# Patient Record
Sex: Female | Born: 1945 | Race: White | Hispanic: No | Marital: Married | Smoking: Never smoker
Health system: Southern US, Community
[De-identification: ages and names within clinical notes are randomized; demographics above are authoritative.]

## PROBLEM LIST (undated history)

## (undated) DIAGNOSIS — D649 Anemia, unspecified: Secondary | ICD-10-CM

## (undated) DIAGNOSIS — C419 Malignant neoplasm of bone and articular cartilage, unspecified: Secondary | ICD-10-CM

## (undated) DIAGNOSIS — IMO0001 Reserved for inherently not codable concepts without codable children: Secondary | ICD-10-CM

## (undated) DIAGNOSIS — C50919 Malignant neoplasm of unspecified site of unspecified female breast: Secondary | ICD-10-CM

## (undated) DIAGNOSIS — C719 Malignant neoplasm of brain, unspecified: Secondary | ICD-10-CM

## (undated) HISTORY — PX: MASTECTOMY: SHX3

## (undated) HISTORY — DX: Reserved for inherently not codable concepts without codable children: IMO0001

## (undated) HISTORY — DX: Malignant neoplasm of unspecified site of unspecified female breast: C50.919

## (undated) HISTORY — DX: Anemia, unspecified: D64.9

---

## 1969-11-26 HISTORY — PX: TUBAL LIGATION: SHX77

## 1999-04-05 ENCOUNTER — Other Ambulatory Visit: Admission: RE | Admit: 1999-04-05 | Discharge: 1999-04-05 | Payer: Self-pay | Admitting: Gynecology

## 2000-04-15 ENCOUNTER — Other Ambulatory Visit: Admission: RE | Admit: 2000-04-15 | Discharge: 2000-04-15 | Payer: Self-pay | Admitting: Gynecology

## 2001-05-06 ENCOUNTER — Other Ambulatory Visit: Admission: RE | Admit: 2001-05-06 | Discharge: 2001-05-06 | Payer: Self-pay | Admitting: Gynecology

## 2002-05-13 ENCOUNTER — Other Ambulatory Visit: Admission: RE | Admit: 2002-05-13 | Discharge: 2002-05-13 | Payer: Self-pay | Admitting: Gynecology

## 2004-05-02 ENCOUNTER — Other Ambulatory Visit: Admission: RE | Admit: 2004-05-02 | Discharge: 2004-05-02 | Payer: Self-pay | Admitting: Gynecology

## 2004-08-08 ENCOUNTER — Ambulatory Visit (HOSPITAL_COMMUNITY): Admission: RE | Admit: 2004-08-08 | Discharge: 2004-08-08 | Payer: Self-pay | Admitting: Gynecology

## 2005-07-26 ENCOUNTER — Other Ambulatory Visit: Admission: RE | Admit: 2005-07-26 | Discharge: 2005-07-26 | Payer: Self-pay | Admitting: Gynecology

## 2005-11-30 ENCOUNTER — Ambulatory Visit (HOSPITAL_COMMUNITY): Admission: RE | Admit: 2005-11-30 | Discharge: 2005-11-30 | Payer: Self-pay | Admitting: Gynecology

## 2006-08-05 ENCOUNTER — Other Ambulatory Visit: Admission: RE | Admit: 2006-08-05 | Discharge: 2006-08-05 | Payer: Self-pay | Admitting: Gynecology

## 2006-08-23 ENCOUNTER — Encounter (INDEPENDENT_AMBULATORY_CARE_PROVIDER_SITE_OTHER): Payer: Self-pay | Admitting: Diagnostic Radiology

## 2006-08-23 ENCOUNTER — Encounter: Admission: RE | Admit: 2006-08-23 | Discharge: 2006-08-23 | Payer: Self-pay | Admitting: General Surgery

## 2006-08-23 ENCOUNTER — Encounter (INDEPENDENT_AMBULATORY_CARE_PROVIDER_SITE_OTHER): Payer: Self-pay | Admitting: Specialist

## 2006-08-28 ENCOUNTER — Ambulatory Visit: Payer: Self-pay | Admitting: Oncology

## 2006-08-30 ENCOUNTER — Encounter: Admission: RE | Admit: 2006-08-30 | Discharge: 2006-08-30 | Payer: Self-pay | Admitting: Oncology

## 2006-09-09 LAB — CBC WITH DIFFERENTIAL/PLATELET
BASO%: 0.7 % (ref 0.0–2.0)
Basophils Absolute: 0.1 10*3/uL (ref 0.0–0.1)
EOS%: 1.4 % (ref 0.0–7.0)
HCT: 41.2 % (ref 34.8–46.6)
MCH: 30.7 pg (ref 26.0–34.0)
MCHC: 34.2 g/dL (ref 32.0–36.0)
MCV: 89.8 fL (ref 81.0–101.0)
MONO%: 10.2 % (ref 0.0–13.0)
NEUT%: 52.5 % (ref 39.6–76.8)
RDW: 13.1 % (ref 11.3–14.5)
lymph#: 2.8 10*3/uL (ref 0.9–3.3)

## 2006-09-09 LAB — COMPREHENSIVE METABOLIC PANEL
AST: 25 U/L (ref 0–37)
Albumin: 4.7 g/dL (ref 3.5–5.2)
Alkaline Phosphatase: 92 U/L (ref 39–117)
BUN: 13 mg/dL (ref 6–23)
Glucose, Bld: 87 mg/dL (ref 70–99)
Potassium: 4.2 mEq/L (ref 3.5–5.3)
Total Bilirubin: 0.4 mg/dL (ref 0.3–1.2)

## 2006-09-13 ENCOUNTER — Ambulatory Visit (HOSPITAL_BASED_OUTPATIENT_CLINIC_OR_DEPARTMENT_OTHER): Admission: RE | Admit: 2006-09-13 | Discharge: 2006-09-13 | Payer: Self-pay | Admitting: General Surgery

## 2006-09-18 ENCOUNTER — Ambulatory Visit: Payer: Self-pay

## 2006-09-18 ENCOUNTER — Encounter: Payer: Self-pay | Admitting: Cardiology

## 2006-09-19 ENCOUNTER — Ambulatory Visit (HOSPITAL_COMMUNITY): Admission: RE | Admit: 2006-09-19 | Discharge: 2006-09-19 | Payer: Self-pay | Admitting: Oncology

## 2006-09-23 ENCOUNTER — Ambulatory Visit (HOSPITAL_COMMUNITY): Admission: RE | Admit: 2006-09-23 | Discharge: 2006-09-23 | Payer: Self-pay | Admitting: Oncology

## 2006-09-24 ENCOUNTER — Ambulatory Visit (HOSPITAL_COMMUNITY): Admission: RE | Admit: 2006-09-24 | Discharge: 2006-09-24 | Payer: Self-pay | Admitting: Oncology

## 2006-09-27 LAB — CBC WITH DIFFERENTIAL/PLATELET
Basophils Absolute: 0 10*3/uL (ref 0.0–0.1)
Eosinophils Absolute: 0.1 10*3/uL (ref 0.0–0.5)
HGB: 12.6 g/dL (ref 11.6–15.9)
LYMPH%: 78 % — ABNORMAL HIGH (ref 14.0–48.0)
MCV: 90 fL (ref 81.0–101.0)
MONO%: 5.7 % (ref 0.0–13.0)
NEUT#: 0.1 10*3/uL — CL (ref 1.5–6.5)
Platelets: 134 10*3/uL — ABNORMAL LOW (ref 145–400)
RDW: 12.6 % (ref 11.3–14.5)

## 2006-10-04 LAB — COMPREHENSIVE METABOLIC PANEL
Alkaline Phosphatase: 112 U/L (ref 39–117)
BUN: 13 mg/dL (ref 6–23)
Creatinine, Ser: 0.84 mg/dL (ref 0.40–1.20)
Glucose, Bld: 104 mg/dL — ABNORMAL HIGH (ref 70–99)
Sodium: 142 mEq/L (ref 135–145)
Total Bilirubin: 0.3 mg/dL (ref 0.3–1.2)
Total Protein: 7.1 g/dL (ref 6.0–8.3)

## 2006-10-04 LAB — CBC WITH DIFFERENTIAL/PLATELET
Eosinophils Absolute: 0 10*3/uL (ref 0.0–0.5)
HCT: 35.8 % (ref 34.8–46.6)
LYMPH%: 9.7 % — ABNORMAL LOW (ref 14.0–48.0)
MCV: 87.9 fL (ref 81.0–101.0)
MONO%: 6.1 % (ref 0.0–13.0)
NEUT#: 13.5 10*3/uL — ABNORMAL HIGH (ref 1.5–6.5)
NEUT%: 82.9 % — ABNORMAL HIGH (ref 39.6–76.8)
Platelets: 219 10*3/uL (ref 145–400)
RBC: 4.07 10*6/uL (ref 3.70–5.32)

## 2006-10-11 LAB — CBC WITH DIFFERENTIAL/PLATELET
Eosinophils Absolute: 0 10*3/uL (ref 0.0–0.5)
LYMPH%: 29.3 % (ref 14.0–48.0)
MONO#: 0.1 10*3/uL (ref 0.1–0.9)
NEUT#: 1.3 10*3/uL — ABNORMAL LOW (ref 1.5–6.5)
Platelets: 131 10*3/uL — ABNORMAL LOW (ref 145–400)
RBC: 3.5 10*6/uL — ABNORMAL LOW (ref 3.70–5.32)
WBC: 2.1 10*3/uL — ABNORMAL LOW (ref 3.9–10.0)
lymph#: 0.6 10*3/uL — ABNORMAL LOW (ref 0.9–3.3)

## 2006-10-15 ENCOUNTER — Ambulatory Visit: Payer: Self-pay | Admitting: Oncology

## 2006-10-18 LAB — CBC WITH DIFFERENTIAL/PLATELET
Basophils Absolute: 0.3 10*3/uL — ABNORMAL HIGH (ref 0.0–0.1)
Eosinophils Absolute: 0 10*3/uL (ref 0.0–0.5)
HCT: 35.2 % (ref 34.8–46.6)
HGB: 12.1 g/dL (ref 11.6–15.9)
LYMPH%: 6.4 % — ABNORMAL LOW (ref 14.0–48.0)
MCHC: 34.2 g/dL (ref 32.0–36.0)
MONO#: 1.6 10*3/uL — ABNORMAL HIGH (ref 0.1–0.9)
NEUT#: 16.1 10*3/uL — ABNORMAL HIGH (ref 1.5–6.5)
NEUT%: 83.8 % — ABNORMAL HIGH (ref 39.6–76.8)
Platelets: 210 10*3/uL (ref 145–400)
WBC: 19.2 10*3/uL — ABNORMAL HIGH (ref 3.9–10.0)

## 2006-10-25 LAB — CBC WITH DIFFERENTIAL/PLATELET
BASO%: 2.5 % — ABNORMAL HIGH (ref 0.0–2.0)
Basophils Absolute: 0 10*3/uL (ref 0.0–0.1)
EOS%: 0.1 % (ref 0.0–7.0)
HCT: 28.4 % — ABNORMAL LOW (ref 34.8–46.6)
HGB: 9.9 g/dL — ABNORMAL LOW (ref 11.6–15.9)
LYMPH%: 40.3 % (ref 14.0–48.0)
MCH: 31.3 pg (ref 26.0–34.0)
MCHC: 35 g/dL (ref 32.0–36.0)
NEUT%: 49.6 % (ref 39.6–76.8)
Platelets: 119 10*3/uL — ABNORMAL LOW (ref 145–400)

## 2006-10-31 LAB — CBC WITH DIFFERENTIAL/PLATELET
BASO%: 0.3 % (ref 0.0–2.0)
Basophils Absolute: 0 10*3/uL (ref 0.0–0.1)
Eosinophils Absolute: 0 10*3/uL (ref 0.0–0.5)
HCT: 30.3 % — ABNORMAL LOW (ref 34.8–46.6)
HGB: 10.2 g/dL — ABNORMAL LOW (ref 11.6–15.9)
MONO#: 1.3 10*3/uL — ABNORMAL HIGH (ref 0.1–0.9)
NEUT#: 9.1 10*3/uL — ABNORMAL HIGH (ref 1.5–6.5)
NEUT%: 81 % — ABNORMAL HIGH (ref 39.6–76.8)
WBC: 11.3 10*3/uL — ABNORMAL HIGH (ref 3.9–10.0)
lymph#: 0.8 10*3/uL — ABNORMAL LOW (ref 0.9–3.3)

## 2006-11-06 ENCOUNTER — Encounter: Admission: RE | Admit: 2006-11-06 | Discharge: 2006-11-06 | Payer: Self-pay | Admitting: Oncology

## 2006-11-07 LAB — CBC WITH DIFFERENTIAL/PLATELET
Basophils Absolute: 0 10*3/uL (ref 0.0–0.1)
EOS%: 0.6 % (ref 0.0–7.0)
HCT: 29.9 % — ABNORMAL LOW (ref 34.8–46.6)
HGB: 10.2 g/dL — ABNORMAL LOW (ref 11.6–15.9)
MCH: 30.6 pg (ref 26.0–34.0)
MCV: 89.9 fL (ref 81.0–101.0)
NEUT%: 74 % (ref 39.6–76.8)
Platelets: 210 10*3/uL (ref 145–400)
lymph#: 0.2 10*3/uL — ABNORMAL LOW (ref 0.9–3.3)

## 2006-11-14 LAB — CBC WITH DIFFERENTIAL/PLATELET
BASO%: 0.6 % (ref 0.0–2.0)
Basophils Absolute: 0.2 10*3/uL — ABNORMAL HIGH (ref 0.0–0.1)
EOS%: 0 % (ref 0.0–7.0)
HGB: 10.1 g/dL — ABNORMAL LOW (ref 11.6–15.9)
MCH: 30.6 pg (ref 26.0–34.0)
MCHC: 33.9 g/dL (ref 32.0–36.0)
RDW: 16.5 % — ABNORMAL HIGH (ref 11.3–14.5)
lymph#: 0.6 10*3/uL — ABNORMAL LOW (ref 0.9–3.3)

## 2006-11-21 LAB — CBC WITH DIFFERENTIAL/PLATELET
Basophils Absolute: 0.5 10*3/uL — ABNORMAL HIGH (ref 0.0–0.1)
Eosinophils Absolute: 0 10*3/uL (ref 0.0–0.5)
HGB: 11.7 g/dL (ref 11.6–15.9)
MONO#: 3.2 10*3/uL — ABNORMAL HIGH (ref 0.1–0.9)
NEUT#: 24.5 10*3/uL — ABNORMAL HIGH (ref 1.5–6.5)
RDW: 15.8 % — ABNORMAL HIGH (ref 11.3–14.5)
lymph#: 1.1 10*3/uL (ref 0.9–3.3)

## 2006-11-28 LAB — CBC WITH DIFFERENTIAL/PLATELET
BASO%: 1.3 % (ref 0.0–2.0)
LYMPH%: 4.6 % — ABNORMAL LOW (ref 14.0–48.0)
MCHC: 35.5 g/dL (ref 32.0–36.0)
MCV: 88.8 fL (ref 81.0–101.0)
MONO%: 7.3 % (ref 0.0–13.0)
Platelets: 208 10*3/uL (ref 145–400)
RBC: 3.38 10*6/uL — ABNORMAL LOW (ref 3.70–5.32)

## 2006-11-28 LAB — COMPREHENSIVE METABOLIC PANEL
Alkaline Phosphatase: 103 U/L (ref 39–117)
Glucose, Bld: 104 mg/dL — ABNORMAL HIGH (ref 70–99)
Sodium: 139 mEq/L (ref 135–145)
Total Bilirubin: 0.9 mg/dL (ref 0.3–1.2)
Total Protein: 6.6 g/dL (ref 6.0–8.3)

## 2006-12-02 ENCOUNTER — Ambulatory Visit: Payer: Self-pay | Admitting: Oncology

## 2006-12-05 LAB — CBC WITH DIFFERENTIAL/PLATELET
Basophils Absolute: 0.1 10*3/uL (ref 0.0–0.1)
Eosinophils Absolute: 0 10*3/uL (ref 0.0–0.5)
HCT: 25.4 % — ABNORMAL LOW (ref 34.8–46.6)
LYMPH%: 6.3 % — ABNORMAL LOW (ref 14.0–48.0)
MCV: 89.6 fL (ref 81.0–101.0)
MONO%: 10.4 % (ref 0.0–13.0)
NEUT#: 5.6 10*3/uL (ref 1.5–6.5)
NEUT%: 82.1 % — ABNORMAL HIGH (ref 39.6–76.8)
Platelets: 258 10*3/uL (ref 145–400)
RBC: 2.83 10*6/uL — ABNORMAL LOW (ref 3.70–5.32)

## 2006-12-12 LAB — CBC WITH DIFFERENTIAL/PLATELET
BASO%: 1.8 % (ref 0.0–2.0)
Basophils Absolute: 0.1 10*3/uL (ref 0.0–0.1)
EOS%: 0.7 % (ref 0.0–7.0)
Eosinophils Absolute: 0 10*3/uL (ref 0.0–0.5)
HCT: 27.9 % — ABNORMAL LOW (ref 34.8–46.6)
HGB: 9.4 g/dL — ABNORMAL LOW (ref 11.6–15.9)
LYMPH%: 7.8 % — ABNORMAL LOW (ref 14.0–48.0)
MCH: 31.4 pg (ref 26.0–34.0)
MCHC: 33.8 g/dL (ref 32.0–36.0)
MCV: 93 fL (ref 81.0–101.0)
MONO#: 0.8 10*3/uL (ref 0.1–0.9)
MONO%: 13.5 % — ABNORMAL HIGH (ref 0.0–13.0)
NEUT#: 4.5 10*3/uL (ref 1.5–6.5)
NEUT%: 76.1 % (ref 39.6–76.8)
Platelets: 356 10*3/uL (ref 145–400)
RBC: 3 10*6/uL — ABNORMAL LOW (ref 3.70–5.32)
RDW: 16.4 % — ABNORMAL HIGH (ref 11.3–14.5)
WBC: 5.9 10*3/uL (ref 3.9–10.0)
lymph#: 0.5 10*3/uL — ABNORMAL LOW (ref 0.9–3.3)

## 2006-12-19 LAB — CBC WITH DIFFERENTIAL/PLATELET
BASO%: 0.7 % (ref 0.0–2.0)
EOS%: 1 % (ref 0.0–7.0)
HCT: 30.9 % — ABNORMAL LOW (ref 34.8–46.6)
LYMPH%: 8.3 % — ABNORMAL LOW (ref 14.0–48.0)
MCH: 29.1 pg (ref 26.0–34.0)
MCHC: 31.7 g/dL — ABNORMAL LOW (ref 32.0–36.0)
MCV: 91.8 fL (ref 81.0–101.0)
MONO%: 11.8 % (ref 0.0–13.0)
NEUT%: 78.2 % — ABNORMAL HIGH (ref 39.6–76.8)
lymph#: 0.6 10*3/uL — ABNORMAL LOW (ref 0.9–3.3)

## 2006-12-25 LAB — CBC WITH DIFFERENTIAL/PLATELET
BASO%: 1.8 % (ref 0.0–2.0)
EOS%: 1.8 % (ref 0.0–7.0)
HGB: 10 g/dL — ABNORMAL LOW (ref 11.6–15.9)
MCH: 29.5 pg (ref 26.0–34.0)
MCHC: 31.8 g/dL — ABNORMAL LOW (ref 32.0–36.0)
MCV: 92.8 fL (ref 81.0–101.0)
MONO%: 5.7 % (ref 0.0–13.0)
RBC: 3.38 10*6/uL — ABNORMAL LOW (ref 3.70–5.32)
RDW: 16.8 % — ABNORMAL HIGH (ref 11.3–14.5)
lymph#: 0.8 10*3/uL — ABNORMAL LOW (ref 0.9–3.3)

## 2007-01-01 LAB — CBC WITH DIFFERENTIAL/PLATELET
Basophils Absolute: 0.1 10*3/uL (ref 0.0–0.1)
Eosinophils Absolute: 0.1 10*3/uL (ref 0.0–0.5)
HGB: 10.3 g/dL — ABNORMAL LOW (ref 11.6–15.9)
MONO%: 9 % (ref 0.0–13.0)
NEUT#: 1.4 10*3/uL — ABNORMAL LOW (ref 1.5–6.5)
RBC: 3.46 10*6/uL — ABNORMAL LOW (ref 3.70–5.32)
RDW: 17.9 % — ABNORMAL HIGH (ref 11.3–14.5)
WBC: 2.6 10*3/uL — ABNORMAL LOW (ref 3.9–10.0)
lymph#: 0.8 10*3/uL — ABNORMAL LOW (ref 0.9–3.3)

## 2007-01-09 LAB — CBC WITH DIFFERENTIAL/PLATELET
Basophils Absolute: 0.1 10*3/uL (ref 0.0–0.1)
Eosinophils Absolute: 0 10*3/uL (ref 0.0–0.5)
HGB: 11.5 g/dL — ABNORMAL LOW (ref 11.6–15.9)
MCV: 94.9 fL (ref 81.0–101.0)
MONO#: 0.2 10*3/uL (ref 0.1–0.9)
MONO%: 8.1 % (ref 0.0–13.0)
NEUT#: 1.1 10*3/uL — ABNORMAL LOW (ref 1.5–6.5)
Platelets: 294 10*3/uL (ref 145–400)
RBC: 3.76 10*6/uL (ref 3.70–5.32)
RDW: 17.4 % — ABNORMAL HIGH (ref 11.3–14.5)
WBC: 2.2 10*3/uL — ABNORMAL LOW (ref 3.9–10.0)

## 2007-01-13 ENCOUNTER — Ambulatory Visit: Payer: Self-pay | Admitting: Oncology

## 2007-01-15 LAB — CBC WITH DIFFERENTIAL/PLATELET
Basophils Absolute: 0 10*3/uL (ref 0.0–0.1)
Eosinophils Absolute: 0 10*3/uL (ref 0.0–0.5)
HCT: 35.5 % (ref 34.8–46.6)
LYMPH%: 25.5 % (ref 14.0–48.0)
MCV: 91.6 fL (ref 81.0–101.0)
MONO#: 0.8 10*3/uL (ref 0.1–0.9)
MONO%: 23.6 % — ABNORMAL HIGH (ref 0.0–13.0)
NEUT#: 1.7 10*3/uL (ref 1.5–6.5)
NEUT%: 49.3 % (ref 39.6–76.8)
Platelets: 262 10*3/uL (ref 145–400)
WBC: 3.5 10*3/uL — ABNORMAL LOW (ref 3.9–10.0)

## 2007-01-22 LAB — CBC WITH DIFFERENTIAL/PLATELET
Basophils Absolute: 0.1 10*3/uL (ref 0.0–0.1)
HCT: 39.8 % (ref 34.8–46.6)
HGB: 12.8 g/dL (ref 11.6–15.9)
MCH: 29.7 pg (ref 26.0–34.0)
MONO#: 0.3 10*3/uL (ref 0.1–0.9)
NEUT%: 65.2 % (ref 39.6–76.8)
WBC: 4.8 10*3/uL (ref 3.9–10.0)
lymph#: 1.3 10*3/uL (ref 0.9–3.3)

## 2007-01-29 LAB — CBC WITH DIFFERENTIAL/PLATELET
Basophils Absolute: 0.1 10*3/uL (ref 0.0–0.1)
EOS%: 1.9 % (ref 0.0–7.0)
HCT: 37.2 % (ref 34.8–46.6)
HGB: 11.9 g/dL (ref 11.6–15.9)
MCH: 29.3 pg (ref 26.0–34.0)
MCV: 91.9 fL (ref 81.0–101.0)
MONO%: 6.2 % (ref 0.0–13.0)
NEUT%: 46.8 % (ref 39.6–76.8)

## 2007-02-05 ENCOUNTER — Encounter: Admission: RE | Admit: 2007-02-05 | Discharge: 2007-02-05 | Payer: Self-pay | Admitting: Oncology

## 2007-02-06 LAB — COMPREHENSIVE METABOLIC PANEL
AST: 17 U/L (ref 0–37)
Alkaline Phosphatase: 61 U/L (ref 39–117)
BUN: 7 mg/dL (ref 6–23)
Calcium: 9 mg/dL (ref 8.4–10.5)
Chloride: 105 mEq/L (ref 96–112)
Creatinine, Ser: 0.67 mg/dL (ref 0.40–1.20)

## 2007-02-06 LAB — CBC WITH DIFFERENTIAL/PLATELET
Basophils Absolute: 0.1 10*3/uL (ref 0.0–0.1)
EOS%: 1.2 % (ref 0.0–7.0)
Eosinophils Absolute: 0 10*3/uL (ref 0.0–0.5)
HCT: 33.6 % — ABNORMAL LOW (ref 34.8–46.6)
HGB: 11.2 g/dL — ABNORMAL LOW (ref 11.6–15.9)
MCH: 29.5 pg (ref 26.0–34.0)
NEUT%: 46.9 % (ref 39.6–76.8)
lymph#: 0.9 10*3/uL (ref 0.9–3.3)

## 2007-02-07 ENCOUNTER — Encounter (INDEPENDENT_AMBULATORY_CARE_PROVIDER_SITE_OTHER): Payer: Self-pay | Admitting: Specialist

## 2007-02-07 ENCOUNTER — Encounter: Admission: RE | Admit: 2007-02-07 | Discharge: 2007-02-07 | Payer: Self-pay | Admitting: General Surgery

## 2007-02-07 ENCOUNTER — Ambulatory Visit (HOSPITAL_COMMUNITY): Admission: RE | Admit: 2007-02-07 | Discharge: 2007-02-07 | Payer: Self-pay | Admitting: General Surgery

## 2007-03-05 ENCOUNTER — Ambulatory Visit: Payer: Self-pay | Admitting: Oncology

## 2007-03-10 ENCOUNTER — Ambulatory Visit: Admission: RE | Admit: 2007-03-10 | Discharge: 2007-06-08 | Payer: Self-pay | Admitting: Radiation Oncology

## 2007-03-10 LAB — COMPREHENSIVE METABOLIC PANEL
ALT: 14 U/L (ref 0–35)
Albumin: 4.3 g/dL (ref 3.5–5.2)
CO2: 28 mEq/L (ref 19–32)
Calcium: 9.6 mg/dL (ref 8.4–10.5)
Chloride: 107 mEq/L (ref 96–112)
Sodium: 143 mEq/L (ref 135–145)
Total Protein: 6.4 g/dL (ref 6.0–8.3)

## 2007-03-10 LAB — CBC WITH DIFFERENTIAL/PLATELET
Basophils Absolute: 0 10*3/uL (ref 0.0–0.1)
EOS%: 1.5 % (ref 0.0–7.0)
HCT: 35.1 % (ref 34.8–46.6)
HGB: 11.8 g/dL (ref 11.6–15.9)
LYMPH%: 18.2 % (ref 14.0–48.0)
MCH: 29.7 pg (ref 26.0–34.0)
MCV: 88.5 fL (ref 81.0–101.0)
MONO%: 8.5 % (ref 0.0–13.0)
NEUT%: 71.3 % (ref 39.6–76.8)
Platelets: 226 10*3/uL (ref 145–400)
RDW: 19.6 % — ABNORMAL HIGH (ref 11.3–14.5)

## 2007-03-10 LAB — LACTATE DEHYDROGENASE: LDH: 174 U/L (ref 94–250)

## 2007-03-19 ENCOUNTER — Encounter (INDEPENDENT_AMBULATORY_CARE_PROVIDER_SITE_OTHER): Payer: Self-pay | Admitting: Specialist

## 2007-03-19 ENCOUNTER — Ambulatory Visit (HOSPITAL_COMMUNITY): Admission: RE | Admit: 2007-03-19 | Discharge: 2007-03-20 | Payer: Self-pay | Admitting: General Surgery

## 2007-04-01 LAB — CBC WITH DIFFERENTIAL/PLATELET
BASO%: 0.6 % (ref 0.0–2.0)
Basophils Absolute: 0 10*3/uL (ref 0.0–0.1)
EOS%: 1.7 % (ref 0.0–7.0)
MCH: 31 pg (ref 26.0–34.0)
MCHC: 34.7 g/dL (ref 32.0–36.0)
MCV: 89.4 fL (ref 81.0–101.0)
MONO%: 6.9 % (ref 0.0–13.0)
RBC: 3.95 10*6/uL (ref 3.70–5.32)
RDW: 17.8 % — ABNORMAL HIGH (ref 11.3–14.5)

## 2007-04-01 LAB — BASIC METABOLIC PANEL
BUN: 10 mg/dL (ref 6–23)
Potassium: 4 mEq/L (ref 3.5–5.3)
Sodium: 145 mEq/L (ref 135–145)

## 2007-04-28 ENCOUNTER — Ambulatory Visit (HOSPITAL_BASED_OUTPATIENT_CLINIC_OR_DEPARTMENT_OTHER): Admission: RE | Admit: 2007-04-28 | Discharge: 2007-04-28 | Payer: Self-pay | Admitting: General Surgery

## 2007-05-22 ENCOUNTER — Ambulatory Visit: Payer: Self-pay | Admitting: Oncology

## 2007-05-28 LAB — CBC WITH DIFFERENTIAL/PLATELET
Eosinophils Absolute: 0.1 10*3/uL (ref 0.0–0.5)
LYMPH%: 25.9 % (ref 14.0–48.0)
MONO#: 0.4 10*3/uL (ref 0.1–0.9)
NEUT#: 3 10*3/uL (ref 1.5–6.5)
Platelets: 184 10*3/uL (ref 145–400)
RBC: 3.84 10*6/uL (ref 3.70–5.32)
WBC: 4.9 10*3/uL (ref 3.9–10.0)

## 2007-06-02 LAB — COMPREHENSIVE METABOLIC PANEL
AST: 19 U/L (ref 0–37)
Alkaline Phosphatase: 77 U/L (ref 39–117)
BUN: 13 mg/dL (ref 6–23)
Creatinine, Ser: 0.76 mg/dL (ref 0.40–1.20)
Potassium: 3.6 mEq/L (ref 3.5–5.3)

## 2007-06-02 LAB — VITAMIN D PNL(25-HYDRXY+1,25-DIHY)-BLD
Vit D, 1,25-Dihydroxy: 23 pg/mL (ref 6–62)
Vit D, 25-Hydroxy: 27 ng/mL (ref 20–57)

## 2007-09-24 ENCOUNTER — Ambulatory Visit (HOSPITAL_COMMUNITY): Admission: RE | Admit: 2007-09-24 | Discharge: 2007-09-24 | Payer: Self-pay | Admitting: Gynecology

## 2007-09-29 ENCOUNTER — Other Ambulatory Visit: Admission: RE | Admit: 2007-09-29 | Discharge: 2007-09-29 | Payer: Self-pay | Admitting: Gynecology

## 2007-10-15 ENCOUNTER — Ambulatory Visit: Payer: Self-pay | Admitting: Oncology

## 2007-10-17 LAB — COMPREHENSIVE METABOLIC PANEL
Albumin: 4.8 g/dL (ref 3.5–5.2)
BUN: 14 mg/dL (ref 6–23)
CO2: 26 mEq/L (ref 19–32)
Calcium: 9.7 mg/dL (ref 8.4–10.5)
Chloride: 103 mEq/L (ref 96–112)
Glucose, Bld: 88 mg/dL (ref 70–99)
Potassium: 3.4 mEq/L — ABNORMAL LOW (ref 3.5–5.3)
Sodium: 142 mEq/L (ref 135–145)
Total Protein: 7.1 g/dL (ref 6.0–8.3)

## 2007-10-17 LAB — CBC WITH DIFFERENTIAL/PLATELET
Basophils Absolute: 0.1 10*3/uL (ref 0.0–0.1)
Eosinophils Absolute: 0.1 10*3/uL (ref 0.0–0.5)
HGB: 13.3 g/dL (ref 11.6–15.9)
NEUT#: 3.9 10*3/uL (ref 1.5–6.5)
RDW: 12.9 % (ref 11.3–14.5)
WBC: 6.1 10*3/uL (ref 3.9–10.0)
lymph#: 1.5 10*3/uL (ref 0.9–3.3)

## 2007-10-17 LAB — CANCER ANTIGEN 27.29: CA 27.29: 19 U/mL (ref 0–39)

## 2008-02-09 ENCOUNTER — Ambulatory Visit: Payer: Self-pay | Admitting: Oncology

## 2008-02-11 LAB — CBC WITH DIFFERENTIAL/PLATELET
Basophils Absolute: 0 10*3/uL (ref 0.0–0.1)
EOS%: 2.5 % (ref 0.0–7.0)
Eosinophils Absolute: 0.1 10*3/uL (ref 0.0–0.5)
LYMPH%: 27.3 % (ref 14.0–48.0)
MCH: 32.8 pg (ref 26.0–34.0)
MCV: 93.7 fL (ref 81.0–101.0)
MONO%: 9.5 % (ref 0.0–13.0)
NEUT#: 3.3 10*3/uL (ref 1.5–6.5)
Platelets: 190 10*3/uL (ref 145–400)
RBC: 3.64 10*6/uL — ABNORMAL LOW (ref 3.70–5.32)

## 2008-02-11 LAB — COMPREHENSIVE METABOLIC PANEL
AST: 20 U/L (ref 0–37)
Alkaline Phosphatase: 68 U/L (ref 39–117)
BUN: 15 mg/dL (ref 6–23)
Glucose, Bld: 105 mg/dL — ABNORMAL HIGH (ref 70–99)
Total Bilirubin: 0.4 mg/dL (ref 0.3–1.2)

## 2008-02-11 LAB — CANCER ANTIGEN 27.29: CA 27.29: 18 U/mL (ref 0–39)

## 2008-08-03 ENCOUNTER — Ambulatory Visit: Payer: Self-pay | Admitting: Oncology

## 2008-08-06 LAB — CBC WITH DIFFERENTIAL/PLATELET
Basophils Absolute: 0 10*3/uL (ref 0.0–0.1)
Eosinophils Absolute: 0.1 10*3/uL (ref 0.0–0.5)
HCT: 37.5 % (ref 34.8–46.6)
HGB: 12.9 g/dL (ref 11.6–15.9)
LYMPH%: 24.2 % (ref 14.0–48.0)
MCV: 95.7 fL (ref 81.0–101.0)
MONO#: 0.4 10*3/uL (ref 0.1–0.9)
NEUT#: 3.8 10*3/uL (ref 1.5–6.5)
NEUT%: 66 % (ref 39.6–76.8)
Platelets: 181 10*3/uL (ref 145–400)
WBC: 5.8 10*3/uL (ref 3.9–10.0)

## 2008-08-09 LAB — LACTATE DEHYDROGENASE: LDH: 170 U/L (ref 94–250)

## 2008-08-09 LAB — COMPREHENSIVE METABOLIC PANEL
BUN: 15 mg/dL (ref 6–23)
CO2: 27 mEq/L (ref 19–32)
Creatinine, Ser: 0.82 mg/dL (ref 0.40–1.20)
Glucose, Bld: 93 mg/dL (ref 70–99)
Sodium: 141 mEq/L (ref 135–145)
Total Bilirubin: 0.5 mg/dL (ref 0.3–1.2)
Total Protein: 6.9 g/dL (ref 6.0–8.3)

## 2008-08-09 LAB — CANCER ANTIGEN 27.29: CA 27.29: 18 U/mL (ref 0–39)

## 2008-08-09 LAB — VITAMIN D 25 HYDROXY (VIT D DEFICIENCY, FRACTURES): Vit D, 25-Hydroxy: 55 ng/mL (ref 30–89)

## 2008-09-30 ENCOUNTER — Other Ambulatory Visit: Admission: RE | Admit: 2008-09-30 | Discharge: 2008-09-30 | Payer: Self-pay | Admitting: Gynecology

## 2008-10-04 ENCOUNTER — Ambulatory Visit (HOSPITAL_COMMUNITY): Admission: RE | Admit: 2008-10-04 | Discharge: 2008-10-04 | Payer: Self-pay | Admitting: Gynecology

## 2009-02-21 ENCOUNTER — Ambulatory Visit: Payer: Self-pay | Admitting: Oncology

## 2009-02-24 LAB — CBC WITH DIFFERENTIAL/PLATELET
Basophils Absolute: 0 10*3/uL (ref 0.0–0.1)
EOS%: 1.5 % (ref 0.0–7.0)
Eosinophils Absolute: 0.1 10*3/uL (ref 0.0–0.5)
HCT: 37.6 % (ref 34.8–46.6)
HGB: 13 g/dL (ref 11.6–15.9)
LYMPH%: 30.5 % (ref 14.0–49.7)
MCH: 33.1 pg (ref 25.1–34.0)
MCV: 95.7 fL (ref 79.5–101.0)
MONO%: 8.4 % (ref 0.0–14.0)
NEUT#: 3 10*3/uL (ref 1.5–6.5)
NEUT%: 59.1 % (ref 38.4–76.8)
Platelets: 194 10*3/uL (ref 145–400)
RDW: 12.7 % (ref 11.2–14.5)

## 2009-02-25 LAB — LACTATE DEHYDROGENASE: LDH: 175 U/L (ref 94–250)

## 2009-09-28 ENCOUNTER — Ambulatory Visit: Payer: Self-pay | Admitting: Oncology

## 2009-09-30 LAB — COMPREHENSIVE METABOLIC PANEL
ALT: 18 U/L (ref 0–35)
AST: 22 U/L (ref 0–37)
Albumin: 4.3 g/dL (ref 3.5–5.2)
Alkaline Phosphatase: 60 U/L (ref 39–117)
Glucose, Bld: 99 mg/dL (ref 70–99)
Potassium: 4 mEq/L (ref 3.5–5.3)
Sodium: 144 mEq/L (ref 135–145)
Total Bilirubin: 0.5 mg/dL (ref 0.3–1.2)
Total Protein: 6.1 g/dL (ref 6.0–8.3)

## 2009-09-30 LAB — CBC WITH DIFFERENTIAL/PLATELET
BASO%: 0.7 % (ref 0.0–2.0)
Eosinophils Absolute: 0.1 10*3/uL (ref 0.0–0.5)
LYMPH%: 27.6 % (ref 14.0–49.7)
MCHC: 33.7 g/dL (ref 31.5–36.0)
MCV: 96.9 fL (ref 79.5–101.0)
MONO%: 9.2 % (ref 0.0–14.0)
NEUT#: 3.2 10*3/uL (ref 1.5–6.5)
RBC: 3.72 10*6/uL (ref 3.70–5.45)
RDW: 13.2 % (ref 11.2–14.5)
WBC: 5.3 10*3/uL (ref 3.9–10.3)

## 2009-09-30 LAB — CANCER ANTIGEN 27.29: CA 27.29: 20 U/mL (ref 0–39)

## 2010-03-30 ENCOUNTER — Ambulatory Visit: Payer: Self-pay | Admitting: Oncology

## 2010-03-31 LAB — LACTATE DEHYDROGENASE: LDH: 175 U/L (ref 94–250)

## 2010-03-31 LAB — CBC WITH DIFFERENTIAL/PLATELET
BASO%: 0.7 % (ref 0.0–2.0)
EOS%: 1.4 % (ref 0.0–7.0)
MCH: 32.4 pg (ref 25.1–34.0)
MCHC: 33.7 g/dL (ref 31.5–36.0)
RBC: 4.01 10*6/uL (ref 3.70–5.45)
RDW: 13.6 % (ref 11.2–14.5)
lymph#: 1.8 10*3/uL (ref 0.9–3.3)

## 2010-03-31 LAB — COMPREHENSIVE METABOLIC PANEL
ALT: 17 U/L (ref 0–35)
Alkaline Phosphatase: 63 U/L (ref 39–117)
Glucose, Bld: 82 mg/dL (ref 70–99)
Sodium: 141 mEq/L (ref 135–145)
Total Bilirubin: 0.5 mg/dL (ref 0.3–1.2)
Total Protein: 6.9 g/dL (ref 6.0–8.3)

## 2010-10-04 ENCOUNTER — Ambulatory Visit: Payer: Self-pay | Admitting: Oncology

## 2010-10-06 LAB — CBC WITH DIFFERENTIAL/PLATELET
BASO%: 0.3 % (ref 0.0–2.0)
Basophils Absolute: 0 10*3/uL (ref 0.0–0.1)
HCT: 38.3 % (ref 34.8–46.6)
LYMPH%: 22.9 % (ref 14.0–49.7)
MCHC: 34 g/dL (ref 31.5–36.0)
MONO#: 0.6 10*3/uL (ref 0.1–0.9)
NEUT%: 68.1 % (ref 38.4–76.8)
Platelets: 219 10*3/uL (ref 145–400)
WBC: 8.1 10*3/uL (ref 3.9–10.3)

## 2010-10-07 LAB — COMPREHENSIVE METABOLIC PANEL
AST: 18 U/L (ref 0–37)
BUN: 13 mg/dL (ref 6–23)
CO2: 34 mEq/L — ABNORMAL HIGH (ref 19–32)
Calcium: 9.8 mg/dL (ref 8.4–10.5)
Chloride: 101 mEq/L (ref 96–112)
Creatinine, Ser: 0.88 mg/dL (ref 0.40–1.20)
Glucose, Bld: 136 mg/dL — ABNORMAL HIGH (ref 70–99)

## 2011-04-10 ENCOUNTER — Other Ambulatory Visit: Payer: Self-pay | Admitting: Oncology

## 2011-04-10 ENCOUNTER — Encounter (HOSPITAL_BASED_OUTPATIENT_CLINIC_OR_DEPARTMENT_OTHER): Payer: 59 | Admitting: Oncology

## 2011-04-10 DIAGNOSIS — C50919 Malignant neoplasm of unspecified site of unspecified female breast: Secondary | ICD-10-CM

## 2011-04-10 DIAGNOSIS — Z170 Estrogen receptor positive status [ER+]: Secondary | ICD-10-CM

## 2011-04-10 LAB — COMPREHENSIVE METABOLIC PANEL
ALT: 17 U/L (ref 0–35)
Albumin: 4.4 g/dL (ref 3.5–5.2)
BUN: 15 mg/dL (ref 6–23)
CO2: 26 mEq/L (ref 19–32)
Calcium: 9.4 mg/dL (ref 8.4–10.5)
Chloride: 103 mEq/L (ref 96–112)
Creatinine, Ser: 0.83 mg/dL (ref 0.40–1.20)
Potassium: 4 mEq/L (ref 3.5–5.3)

## 2011-04-10 LAB — CBC WITH DIFFERENTIAL/PLATELET
Basophils Absolute: 0 10*3/uL (ref 0.0–0.1)
Eosinophils Absolute: 0.1 10*3/uL (ref 0.0–0.5)
HCT: 38.3 % (ref 34.8–46.6)
HGB: 13 g/dL (ref 11.6–15.9)
MONO#: 0.5 10*3/uL (ref 0.1–0.9)
NEUT#: 3.3 10*3/uL (ref 1.5–6.5)
NEUT%: 58.8 % (ref 38.4–76.8)
WBC: 5.6 10*3/uL (ref 3.9–10.3)
lymph#: 1.7 10*3/uL (ref 0.9–3.3)

## 2011-04-10 LAB — CANCER ANTIGEN 27.29: CA 27.29: 19 U/mL (ref 0–39)

## 2011-04-10 NOTE — Op Note (Signed)
NAMEANNER, BAITY NO.:  [id number]   MEDICAL RECORD NO.:  [id number]          PATIENT TYPE:  AMB   LOCATION:  DSC                          FACILITY:  [facility]   PHYSICIAN:  Leonie Man, M.D.   DATE OF BIRTH:  [birth year]   DATE OF PROCEDURE:  04/28/2007  DATE OF DISCHARGE:                               OPERATIVE REPORT   PREOPERATIVE DIAGNOSIS:  Port-A-Cath for removal status post  chemotherapy.   POSTOPERATIVE DIAGNOSIS:  Port-A-Cath for removal status post  chemotherapy.   PROCEDURE:  Removal of Port-A-Cath.   SURGEON:  Ballen.   ASSISTANT:  OR nurse.   ANESTHESIA:  Is MAC.   SPECIMENS:  Port-A-Cath which was disposed of.   ESTIMATED BLOOD LOSS:  Was minimal.   There were no complications.  The patient returned to the PACU in  excellent condition.   NOTE:  Gina [name] is a 65 year old female with right-sided breast cancer  who underwent neoadjuvant chemotherapy and subsequently underwent right  modified radical mastectomy for advanced breast cancer.  Having  completed all of her chemotherapy in the neoadjuvant setting, now is  ready for the Port-A-Cath to be removed.  She comes to the operating  room after the risks and potential benefits of surgery have been  discussed, questions answered and consent obtained.   PROCEDURE:  The patient was positioned supinely, and following induction  of satisfactory sedation, the anterior the left side is prepped and  draped to be included in a sterile operative field, infiltrated with  0.5% Marcaine with epinephrine.  The old scar cicatrix was incised down  upon, carrying it down to the Port-A-Cath pocket.  Port-A-Cath was  elevated, and the sutures holding the Port-A-Cath in place were removed.  The Port-A-Cath tunnel was then closed with a 3-0 Vicryl suture as the  entire Port-A-Cath was removed from the surgical site.  The subcutaneous  tissues and base of Port-A-Cath was closed with interrupted 3-0  Vicryl  sutures.  Skin was closed with 4-0 Monocryl sutures and reinforced with  Steri-Strips.  A sterile dressing was applied.  The anesthetic reversed.  The patient removed from the operating room to the recovery room in  stable condition.  She tolerated the procedure well.      Leonie Man, M.D.  Electronically Signed     PB/MEDQ  D:  04/28/2007  T:  04/28/2007  Job:  [id number]   cc:   Leonie Man, M.D.

## 2011-04-13 NOTE — Op Note (Signed)
[name], Gina [name]                  ACCOUNT NO.:  [id number]   MEDICAL RECORD NO.:  [id number]          PATIENT TYPE:  OIB   LOCATION:  2550                         FACILITY:  [facility]   PHYSICIAN:  Leonie Man, M.D.   DATE OF BIRTH:  December 16, 1945   DATE OF PROCEDURE:  03/19/2007  DATE OF DISCHARGE:                               OPERATIVE REPORT   PREOPERATIVE DIAGNOSIS:  Lobular carcinoma of the right breast.   POSTOPERATIVE DIAGNOSIS:  Lobular carcinoma of the right breast.   PROCEDURE:  Right modified radical mastectomy.   SURGEON:  Leonie Man, M.D.   ASSISTANT:  Rose Phi. Maple Hudson, M.D.   ANESTHESIA:  General.   INDICATIONS:  [name] is a 65 year old female presenting with advanced  right sided breast cancer.  She underwent new adjuvant chemotherapy and  had significant tumor regression and on return underwent partial  mastectomy and sentinel lymph node biopsy.  The pathology of the partial  mastectomy specimen showed that the patient had a close margin that is  less than 1 mm as well as a positive lymph node.  The patient returns  now for right sided modified radical mastectomy.  The risks and  potential benefits of surgery had been fully discussed.  All questions  answered and consent obtained for surgery.   PROCEDURE:  The patient is positioned supinely following the induction  of satisfactory general anesthesia.  The right breast is prepped and  draped to be included in a sterile operative field.  An elliptical  incision inclusive of the nipple areolar complex and the previous  lumpectomy site is deepened through the skin and through the  subcutaneous tissues.  Superomedial flap is raised up to the clavicle  and towards the sternal border.  Inferomedial flap carried down to the  rectus muscle and to the anterior border of the latissimus dorsi muscle.  The breast tissues are then dissected free from the underlying  pectoralis major muscle, taking the anterior  pectoralis fascia, carrying  the tissue from medial to lateral at the edge of the pectoralis major  and minor muscle.  The dissection was carried down over the serratus  anterior muscles.  With the pectoralis major and minor muscles retracted  medially, dissection was carried up into the axilla where axillary  dissection was carried out starting at [facility] ligament, dissecting  all of the axillary content below and above the right axillary vein,  carrying the dissection laterally in the lateralmost portion of the  dissection.  The long thoracic and thoracodorsal nerves were both  positively identified and spared.  The remaining attachments of the  pectoralis to the serratus and to the latissimus dorsi were taken down  and the specimen removed and forwarded for pathologic evaluation.  The  wound was then thoroughly irrigated with normal saline.  All areas of  the dissection checked for hemostasis.  Additional bleeding points were  treated with electrocautery.  Two 19-French Blake drains were placed  under the flaps for drainage.  Sponge, instruments and sharp counts were  then verified.  The incision was closed with stainless  steel staples and  the drains were attached to  the skin with 3-0 nylon sutures.  Sterile compressive dressings were  applied both over the incision and over the drain site.  The anesthetic  was reversed and the patient was removed from the operating room to the  recovery room in stable condition.  She tolerated the procedure well.      Leonie Man, M.D.  Electronically Signed     PB/MEDQ  D:  03/19/2007  T:  03/19/2007  Job:  [id number]

## 2011-04-13 NOTE — Op Note (Signed)
Gina [name], Gina [name]                  ACCOUNT NO.:  [id number]   MEDICAL RECORD NO.:  [id number]          PATIENT TYPE:  AMB   LOCATION:  SDS                          FACILITY:  [facility]   PHYSICIAN:  Leonie Man, M.D.   DATE OF BIRTH:  05-25-1946   DATE OF PROCEDURE:  02/07/2007  DATE OF DISCHARGE:  02/07/2007                               OPERATIVE REPORT   PREOPERATIVE DIAGNOSIS:  Carcinoma of the right breast.   POSTOPERATIVE DIAGNOSIS:  Carcinoma of the right breast.   PROCEDURE:  Right lumpectomy (partial mastectomy) and sentinel lymph  node biopsy.   SURGEON:  Leonie Man, M.D.   ASSISTANT:  OR tech.   ANESTHESIA:  General.   SPECIMEN TO THE LAB:  Right breast tissue and sentinel lymph node from  the right axilla; specimens forwarded to pathology for touch preps.   ESTIMATED BLOOD LOSS:  Minimal.   COMPLICATIONS:  There were no complications noted during the course of  the operation, and the patient returned to the PACU in excellent  condition.   INDICATIONS:  The patient is a 65 year old white female, who presents  with a locally advanced breast carcinoma, which on biopsy, shows an  invasive lobular carcinoma.  The tumor is ER PR positive, HER-2  negative.  The patient underwent neoadjuvant chemotherapy with FEC and  Arbaxin.  At the time of surgery, the patient is noted to have had what  we feel to be a complete clinical response.  The patient comes to the  operating room now for partial mastectomy and sentinel lymph node biopsy  as the second portion of her treatment regimen.  It is anticipated that  she will have radiation therapy.   DESCRIPTION OF PROCEDURE:  The patient comes to the operating room and  is identified as Gina [name].  The periareolar region is infiltrated with  approximately 5 mL of diluted methylene blue dye.  The patient had had  Technetium Sulfur Colloid injected prior to this.  The dye was injected  into the lymphatics, and then the breast  was prepped and draped to be  increased in a sterile operative field.  The localizing needle, which  entered from the medial aspect of the breast, approximately 2  fingerbreadths away from the areolar border, was approached through a  circumareolar incision, which was deepened through the skin and  subcutaneous tissue.  A flap was raised medially and superiorly so as to  include the localizing needle within the incision.  Dissection was  carried down under the nipple and the nipple-areolar margin was marked  with two silk sutures.  A complete excision was carried down around the  entire lesion, carrying the dissection all the way down to the anterior  pectoralis fascia, and including the anterior pectoralis fascia in the  dissection.  This was then removed and forwarded for pathologic  evaluation.  After this, specimen mammography showed the lesion, the  area of distortion as well as the localizing clip to be well within the  specimen.  Attention was then turned to the right axilla, after  hemostasis was  assured within the breast.  With the use of the Neoprobe,  an area of high radioactive counts measuring greater than 3200 was  located at the axillary edge.  A transverse incision was made in this  area and dissection carried down using the Neoprobe to localize the  sentinel node.  The sentinel node was located with high counts and was  colored blue.  The sentinel node was dissected and removed and forwarded  for pathologic evaluation.  A search for additional sentinel nodes was  negative.  The sentinel node on touch prep showed no evidence of  metastatic carcinoma.  Touch prep on the breast specimen also showed no  evidence of margins containing carcinoma.  Sponge, instrument and sharp  counts were then doubly verified.  The axillary wound was closed in 2  layers using interrupted 2-0 Vicryl sutures and a 5-0 Monocryl suture.  The breast wound was similarly closed using interrupted 3-0  Vicryl  suture in the subcutaneous tissue and a 5-0 Monocryl suture to close the  skin.  Both wounds were injected with approximately 10 mL of Sensorcaine  plain.  The wounds were then reinforced with Steri-Strips.  Sterile  dressings were applied, the anesthetic reversed and the patient moved  from the operating room to the recovery room in stable condition.  She  tolerated the procedure well.      Leonie Man, M.D.  Electronically Signed     PB/MEDQ  D:  02/07/2007  T:  02/08/2007  Job:  [id number]   cc:   [facility]  Pierce Crane, M.D.

## 2011-04-13 NOTE — Op Note (Signed)
Gina [name], Gina [name]                  ACCOUNT NO.:  [id number]   MEDICAL RECORD NO.:  [id number]          PATIENT TYPE:  AMB   LOCATION:  DSC                          FACILITY:  [facility]   PHYSICIAN:  Leonie Man, M.D.   DATE OF BIRTH:  05/27/1946   DATE OF PROCEDURE:  09/13/2006  DATE OF DISCHARGE:                                 OPERATIVE REPORT   PREOPERATIVE DIAGNOSIS:  Poor venous access with carcinoma right breast.   POSTOPERATIVE DIAGNOSIS:  Poor venous access with carcinoma right breast.   PROCEDURE:  Port-A-Cath implantation via left subclavian vein.   SURGEON:  Leonie Man, M.D.   ASSISTANT:  OR nurse.   ANESTHESIA:  MAC.   SPECIMENS:  No specimens to the lab.   ESTIMATED BLOOD LOSS:  Minimal.   COMPLICATIONS:  None.   The patient returned to PACU in excellent condition.   Gina [name] is a 65 year old female with a diagnosed advanced right-sided  breast carcinoma who is being prepared for neoadjuvant chemotherapy.  She  comes to the operating room for placement of a Port-A-Cath to facilitate her  chemotherapy.  The patient is aware of the risks and potential benefits of  surgery including the potential for vascular injury, pneumothorax and  nervous injury.  She gives her consent to same.   PROCEDURE:  The patient is positioned supinely and following the induction  of satisfactory sedation, the patient is turned in the Trendelenburg  position.  The anterior chest and neck were prepped and draped to be  included in a sterile operative field.  I did a subclavian stick into the  left subclavian vein threading a guidewire into the central venous system,  positioning this just outside the atrium.  I then created a pocket on the  anterior chest wall, from the pocket a Silastic catheter was pulled up into  the shoulder wound.  The reservoir was then attached to the external portion  of the Port-A-Cath using a Cook introducer and dilator over the guidewire  under  fluoroscopic guidance.  This was placed into the central venous  system.  The dilator and guidewire were removed.  Silastic catheter threaded  into the central venous system and positioned at the atrial vena caval  junction.  Inflow of heparinized saline and blood return were noted to be  excellent.  The external portion of the Silastic catheter now securely  attached to the reservoir was tested again for inflow of heparinized saline  and blood return.  The reservoir was then sutured down into the pocket with  2-0 silk sutures.  Sponge, instrument and sharp counts were verified.  The  wounds closed in  layers using interrupted 3-0 Vicryl sutures in the subcutaneous tissue and a  running 4-0 Monocryl to close the skin wounds.  Sterile dressings were  applied.  The anesthetic reversed and the patient removed from the operating  room to the recovery room in stable condition.  She tolerated the procedure  well.      Leonie Man, M.D.  Electronically Signed     PB/MEDQ  D:  09/13/2006  T:  09/15/2006  Job:  [id number]

## 2011-04-17 ENCOUNTER — Encounter (HOSPITAL_BASED_OUTPATIENT_CLINIC_OR_DEPARTMENT_OTHER): Payer: 59 | Admitting: Oncology

## 2011-04-17 ENCOUNTER — Other Ambulatory Visit: Payer: Self-pay | Admitting: Oncology

## 2011-04-17 DIAGNOSIS — C50919 Malignant neoplasm of unspecified site of unspecified female breast: Secondary | ICD-10-CM

## 2011-09-13 LAB — POCT HEMOGLOBIN-HEMACUE: Operator id: 123881

## 2011-10-15 ENCOUNTER — Other Ambulatory Visit (HOSPITAL_BASED_OUTPATIENT_CLINIC_OR_DEPARTMENT_OTHER): Payer: 59 | Admitting: Lab

## 2011-10-15 ENCOUNTER — Other Ambulatory Visit: Payer: Self-pay | Admitting: Oncology

## 2011-10-15 DIAGNOSIS — Z170 Estrogen receptor positive status [ER+]: Secondary | ICD-10-CM

## 2011-10-15 DIAGNOSIS — C50919 Malignant neoplasm of unspecified site of unspecified female breast: Secondary | ICD-10-CM

## 2011-10-15 LAB — CBC WITH DIFFERENTIAL/PLATELET
Basophils Absolute: 0 10*3/uL (ref 0.0–0.1)
EOS%: 1 % (ref 0.0–7.0)
Eosinophils Absolute: 0.1 10*3/uL (ref 0.0–0.5)
LYMPH%: 21.8 % (ref 14.0–49.7)
MCH: 32 pg (ref 25.1–34.0)
MCV: 96 fL (ref 79.5–101.0)
MONO%: 7.5 % (ref 0.0–14.0)
Platelets: 195 10*3/uL (ref 145–400)
RBC: 3.99 10*6/uL (ref 3.70–5.45)
RDW: 13.9 % (ref 11.2–14.5)

## 2011-10-15 LAB — COMPREHENSIVE METABOLIC PANEL
ALT: 20 U/L (ref 0–35)
AST: 25 U/L (ref 0–37)
Albumin: 4.4 g/dL (ref 3.5–5.2)
Alkaline Phosphatase: 84 U/L (ref 39–117)
Potassium: 3.4 mEq/L — ABNORMAL LOW (ref 3.5–5.3)
Sodium: 139 mEq/L (ref 135–145)
Total Bilirubin: 0.3 mg/dL (ref 0.3–1.2)
Total Protein: 7.1 g/dL (ref 6.0–8.3)

## 2011-10-15 LAB — VITAMIN D 25 HYDROXY (VIT D DEFICIENCY, FRACTURES): Vit D, 25-Hydroxy: 26 ng/mL — ABNORMAL LOW (ref 30–89)

## 2011-10-25 ENCOUNTER — Telehealth: Payer: Self-pay | Admitting: Oncology

## 2011-10-25 ENCOUNTER — Ambulatory Visit (HOSPITAL_BASED_OUTPATIENT_CLINIC_OR_DEPARTMENT_OTHER): Payer: 59 | Admitting: Oncology

## 2011-10-25 DIAGNOSIS — Z171 Estrogen receptor negative status [ER-]: Secondary | ICD-10-CM

## 2011-10-25 DIAGNOSIS — C50419 Malignant neoplasm of upper-outer quadrant of unspecified female breast: Secondary | ICD-10-CM

## 2011-10-25 DIAGNOSIS — C50919 Malignant neoplasm of unspecified site of unspecified female breast: Secondary | ICD-10-CM

## 2011-10-25 DIAGNOSIS — E559 Vitamin D deficiency, unspecified: Secondary | ICD-10-CM

## 2011-10-25 NOTE — Telephone Encounter (Signed)
Gv pt appt for may2013 

## 2011-10-25 NOTE — Progress Notes (Signed)
Hematology and Oncology Follow Up Visit  Gina [name] [id number] 06-Nov-1946 65 y.o. 10/25/2011 2:16 PM   Principle Diagnosis: 65 yo woman with er+ breast cancer s/p lumpectomy, xrt , 2008 on femara  Interim History:  Doing well, no complaints , recent urti. No changes in meds.  Medications: I have reviewed the patient's current medications.  Allergies: No Known Allergies  Past Medical History, Surgical history, Social history, and Family History were reviewed and updated.  Review of Systems: Constitutional:  Negative for fever, chills, night sweats, anorexia, weight loss, pain. Cardiovascular: no chest pain or dyspnea on exertion Respiratory: no cough, shortness of breath, or wheezing Neurological: no TIA or stroke symptoms Dermatological: negative ENT: negative Skin Gastrointestinal: no abdominal pain, change in bowel habits, or black or bloody stools Genito-Urinary: no dysuria, trouble voiding, or hematuria Hematological and Lymphatic: negative Breast: negative for breast lumps Musculoskeletal: negative Remaining ROS negative.  Physical Exam: Blood pressure 109/66, pulse 97, temperature 98 F (36.7 C), temperature source Oral, height 5' 3.5" (1.613 m), weight 98 lb 14.4 oz (44.861 kg). ECOG:  General appearance: alert, cooperative and appears stated age Head: Normocephalic, without obvious abnormality, atraumatic Neck: no adenopathy, no carotid bruit, no JVD, supple, symmetrical, trachea midline and thyroid not enlarged, symmetric, no tenderness/mass/nodules Lymph nodes: Cervical, supraclavicular, and axillary nodes normal. Cardiac : nl heart sounds Pulmonary: nl breath sounds Breasts:no masses, nipple retraction or skin changes Abdomen:nl Extremities nl Neuro:nl  Lab Results: Lab Results  Component Value Date   WBC 6.1 10/15/2011   HGB 12.8 10/15/2011   HCT 38.4 10/15/2011   MCV 96.0 10/15/2011   PLT 195 10/15/2011     Chemistry      Component Value  Date/Time   NA 139 10/15/2011 1458   NA 139 10/15/2011 1458   K 3.4* 10/15/2011 1458   K 3.4* 10/15/2011 1458   CL 102 10/15/2011 1458   CL 102 10/15/2011 1458   CO2 28 10/15/2011 1458   CO2 28 10/15/2011 1458   BUN 14 10/15/2011 1458   BUN 14 10/15/2011 1458   CREATININE 0.68 10/15/2011 1458   CREATININE 0.68 10/15/2011 1458      Component Value Date/Time   CALCIUM 9.7 10/15/2011 1458   CALCIUM 9.7 10/15/2011 1458   ALKPHOS 84 10/15/2011 1458   ALKPHOS 84 10/15/2011 1458   AST 25 10/15/2011 1458   AST 25 10/15/2011 1458   ALT 20 10/15/2011 1458   ALT 20 10/15/2011 1458   BILITOT 0.3 10/15/2011 1458   BILITOT 0.3 10/15/2011 1458       Radiological Studies: chest X-ray n/a Mammogram 5/12 -nl Bone density 5/12- sl decrease in bd of femur  Impression and Plan: Hx of er+ breast cancer, doing well without evidence of recurrence. F/u 6 months.. Continue femara to 2013. F/u flu shot   More than 50% of the visit was spent in patient-related counselling   Pierce Crane, MD 11/29/20122:16 PM

## 2011-11-23 ENCOUNTER — Other Ambulatory Visit: Payer: Self-pay | Admitting: Oncology

## 2011-11-23 DIAGNOSIS — C50919 Malignant neoplasm of unspecified site of unspecified female breast: Secondary | ICD-10-CM

## 2012-01-27 ENCOUNTER — Other Ambulatory Visit: Payer: Self-pay | Admitting: Oncology

## 2012-01-27 DIAGNOSIS — Z170 Estrogen receptor positive status [ER+]: Secondary | ICD-10-CM

## 2012-01-27 DIAGNOSIS — C50919 Malignant neoplasm of unspecified site of unspecified female breast: Secondary | ICD-10-CM

## 2012-03-10 ENCOUNTER — Other Ambulatory Visit: Payer: Self-pay | Admitting: Oncology

## 2012-03-10 DIAGNOSIS — C50919 Malignant neoplasm of unspecified site of unspecified female breast: Secondary | ICD-10-CM

## 2012-04-17 ENCOUNTER — Other Ambulatory Visit (HOSPITAL_BASED_OUTPATIENT_CLINIC_OR_DEPARTMENT_OTHER): Payer: 59 | Admitting: Lab

## 2012-04-17 DIAGNOSIS — E559 Vitamin D deficiency, unspecified: Secondary | ICD-10-CM

## 2012-04-17 DIAGNOSIS — C50919 Malignant neoplasm of unspecified site of unspecified female breast: Secondary | ICD-10-CM

## 2012-04-17 LAB — CBC WITH DIFFERENTIAL/PLATELET
EOS%: 1.7 % (ref 0.0–7.0)
MCH: 30.9 pg (ref 25.1–34.0)
MCHC: 33.3 g/dL (ref 31.5–36.0)
MCV: 92.7 fL (ref 79.5–101.0)
MONO%: 9.2 % (ref 0.0–14.0)
NEUT#: 3.1 10*3/uL (ref 1.5–6.5)
RBC: 4.11 10*6/uL (ref 3.70–5.45)
RDW: 13.5 % (ref 11.2–14.5)

## 2012-04-18 LAB — COMPREHENSIVE METABOLIC PANEL
AST: 21 U/L (ref 0–37)
Albumin: 4.5 g/dL (ref 3.5–5.2)
Alkaline Phosphatase: 68 U/L (ref 39–117)
Potassium: 3.9 mEq/L (ref 3.5–5.3)
Sodium: 141 mEq/L (ref 135–145)
Total Bilirubin: 0.5 mg/dL (ref 0.3–1.2)
Total Protein: 6.3 g/dL (ref 6.0–8.3)

## 2012-04-24 ENCOUNTER — Ambulatory Visit (HOSPITAL_BASED_OUTPATIENT_CLINIC_OR_DEPARTMENT_OTHER): Payer: 59 | Admitting: Oncology

## 2012-04-24 ENCOUNTER — Telehealth: Payer: Self-pay | Admitting: *Deleted

## 2012-04-24 VITALS — BP 107/65 | HR 80 | Temp 98.5°F | Ht 63.5 in | Wt 108.2 lb

## 2012-04-24 DIAGNOSIS — Z170 Estrogen receptor positive status [ER+]: Secondary | ICD-10-CM

## 2012-04-24 DIAGNOSIS — E559 Vitamin D deficiency, unspecified: Secondary | ICD-10-CM

## 2012-04-24 DIAGNOSIS — C50919 Malignant neoplasm of unspecified site of unspecified female breast: Secondary | ICD-10-CM

## 2012-04-24 NOTE — Progress Notes (Signed)
Hematology and Oncology Follow Up Visit  Gina [name] [id number] 1946-08-04 66 y.o. 04/24/2012 2:14 PM   Principle Diagnosis: 66 yo woman with er+ breast cancer s/p mastectomy 03/19/07,  2008 on femara  Interim History:  Doing well, no complaints , recent urti. No changes in meds. She is doing well, her husband had bladder cancer and is s/p cystectomy. She has no other complaints , she is busy with her 4 dogs. She looks after her mom with dementia ho is 53. She works for [employer]. She has been there for 31 yrs.  Medications: I have reviewed the patient's current medications.  Allergies: No Known Allergies  Past Medical History, Surgical history, Social history, and Family History were reviewed and updated.  Review of Systems: Constitutional:  Negative for fever, chills, night sweats, anorexia, weight loss, pain. Cardiovascular: no chest pain or dyspnea on exertion Respiratory: no cough, shortness of breath, or wheezing Neurological: no TIA or stroke symptoms Dermatological: negative ENT: negative Skin Gastrointestinal: no abdominal pain, change in bowel habits, or black or bloody stools Genito-Urinary: no dysuria, trouble voiding, or hematuria Hematological and Lymphatic: negative Breast: negative for breast lumps Musculoskeletal: negative Remaining ROS negative.  Physical Exam: Blood pressure 107/65, pulse 80, temperature 98.5 F (36.9 C), temperature source Oral, height 5' 3.5" (1.613 m), weight 108 lb 3.2 oz (49.079 kg). ECOG:  General appearance: alert, cooperative and appears stated age Head: Normocephalic, without obvious abnormality, atraumatic Neck: no adenopathy, no carotid bruit, no JVD, supple, symmetrical, trachea midline and thyroid not enlarged, symmetric, no tenderness/mass/nodules Lymph nodes: Cervical, supraclavicular, and axillary nodes normal. Cardiac : nl heart sounds Pulmonary: nl breath sounds Breasts:no masses, nipple retraction or  skin changes Abdomen:nl Extremities nl Neuro:nl  Lab Results: Lab Results  Component Value Date   WBC 5.4 04/17/2012   HGB 12.7 04/17/2012   HCT 38.1 04/17/2012   MCV 92.7 04/17/2012   PLT 207 04/17/2012     Chemistry      Component Value Date/Time   NA 141 04/17/2012 1314   K 3.9 04/17/2012 1314   CL 105 04/17/2012 1314   CO2 30 04/17/2012 1314   BUN 15 04/17/2012 1314   CREATININE 0.87 04/17/2012 1314      Component Value Date/Time   CALCIUM 9.5 04/17/2012 1314   ALKPHOS 68 04/17/2012 1314   AST 21 04/17/2012 1314   ALT 16 04/17/2012 1314   BILITOT 0.5 04/17/2012 1314       Radiological Studies: chest X-ray n/a Mammogram 5/13-wnl Bone density 5/12- sl decrease in bd of femur  Impression and Plan: Hx of er+ breast cancer, doing well without evidence of recurrence. F/u 12  months..  Discussed discontinuation of letrozole, she has agreed ,  wil f/u in 1 yr.  More than 50% of the visit was spent in patient-related counselling   Pierce Crane, MD 5/30/20132:14 PM

## 2012-04-24 NOTE — Telephone Encounter (Signed)
gave patient appointment for 2014 printed out calendar and gave to the patient 

## 2012-06-09 ENCOUNTER — Other Ambulatory Visit: Payer: Self-pay | Admitting: Oncology

## 2012-06-09 DIAGNOSIS — C50919 Malignant neoplasm of unspecified site of unspecified female breast: Secondary | ICD-10-CM

## 2012-10-29 ENCOUNTER — Telehealth: Payer: Self-pay | Admitting: *Deleted

## 2012-10-29 NOTE — Telephone Encounter (Signed)
Mailed out calendar to inform the patient of the new date and time 

## 2013-02-14 ENCOUNTER — Encounter: Payer: Self-pay | Admitting: Oncology

## 2013-02-14 ENCOUNTER — Telehealth: Payer: Self-pay | Admitting: Oncology

## 2013-02-14 NOTE — Telephone Encounter (Signed)
, °

## 2013-04-17 ENCOUNTER — Other Ambulatory Visit (HOSPITAL_BASED_OUTPATIENT_CLINIC_OR_DEPARTMENT_OTHER): Payer: 59 | Admitting: Lab

## 2013-04-17 DIAGNOSIS — C50919 Malignant neoplasm of unspecified site of unspecified female breast: Secondary | ICD-10-CM

## 2013-04-17 DIAGNOSIS — E559 Vitamin D deficiency, unspecified: Secondary | ICD-10-CM

## 2013-04-17 LAB — CBC WITH DIFFERENTIAL/PLATELET
BASO%: 1.8 % (ref 0.0–2.0)
EOS%: 1.6 % (ref 0.0–7.0)
HCT: 36.3 % (ref 34.8–46.6)
LYMPH%: 30.6 % (ref 14.0–49.7)
MCH: 30.9 pg (ref 25.1–34.0)
MCHC: 33.6 g/dL (ref 31.5–36.0)
MCV: 92 fL (ref 79.5–101.0)
MONO%: 10.1 % (ref 0.0–14.0)
NEUT%: 55.9 % (ref 38.4–76.8)
Platelets: 190 10*3/uL (ref 145–400)

## 2013-04-17 LAB — COMPREHENSIVE METABOLIC PANEL (CC13)
AST: 29 U/L (ref 5–34)
Alkaline Phosphatase: 146 U/L (ref 40–150)
BUN: 13.5 mg/dL (ref 7.0–26.0)
Creatinine: 0.8 mg/dL (ref 0.6–1.1)

## 2013-04-24 ENCOUNTER — Ambulatory Visit: Payer: 59 | Admitting: Oncology

## 2013-04-28 ENCOUNTER — Encounter: Payer: Self-pay | Admitting: Oncology

## 2013-04-28 ENCOUNTER — Ambulatory Visit (HOSPITAL_BASED_OUTPATIENT_CLINIC_OR_DEPARTMENT_OTHER): Payer: 59 | Admitting: Oncology

## 2013-04-28 ENCOUNTER — Ambulatory Visit: Payer: 59 | Admitting: Oncology

## 2013-04-28 VITALS — BP 110/66 | HR 77 | Temp 98.5°F | Resp 20 | Ht 63.5 in | Wt 111.7 lb

## 2013-04-28 DIAGNOSIS — C50919 Malignant neoplasm of unspecified site of unspecified female breast: Secondary | ICD-10-CM

## 2013-04-28 DIAGNOSIS — Z853 Personal history of malignant neoplasm of breast: Secondary | ICD-10-CM

## 2013-04-28 DIAGNOSIS — C50911 Malignant neoplasm of unspecified site of right female breast: Secondary | ICD-10-CM

## 2013-04-28 DIAGNOSIS — E559 Vitamin D deficiency, unspecified: Secondary | ICD-10-CM

## 2013-04-28 HISTORY — DX: Malignant neoplasm of unspecified site of unspecified female breast: C50.919

## 2013-04-28 MED ORDER — CALCIUM CARBONATE-VITAMIN D 500-200 MG-UNIT PO TABS: 2.0000 | ORAL_TABLET | Freq: Every day | ORAL | Status: DC

## 2013-04-28 MED ORDER — VITAMIN D3 50 MCG (2000 UT) PO TABS: 2000.0000 [IU] | ORAL_TABLET | Freq: Every day | ORAL | Status: DC

## 2013-04-28 NOTE — Patient Instructions (Addendum)
Doing well  Your vitamin D levels were and you should begin vitamin D3 2000units daily and calsium. Prescriptions sent to your pharmacy  I will see you back in 1 year

## 2013-05-22 NOTE — Progress Notes (Signed)
OFFICE PROGRESS NOTE  CC  Gina Hoff, MD [address] [place] [address] invasive mammary carcinoma with lobular features. Diagnosed in October 2007 Dr. Teodora [name] Dr.Daniel [name]  DIAGNOSIS: 67 year old female with diagnosis of stage II (T1 N1)  PRIOR THERAPY:  #1 patient presented with a mass in her right breast in October 2007. She underwent bilateral mammograms. The right breast ultrasound performed on 08/19/2006 showed increased density and distortion in the subareolar location of the right breast. Ultrasound showed abnormal echoes in the subareolar location which was a change measuring 1.5 cm. Patient underwent an ultrasound-guided biopsy on 08/23/2006 which showed invasive mammary carcinoma with lobular features. ER +100% PR 3% proliferation marker Ki-67 12% HER-2/neu 2+ by fish. MRI of the breasts on 08/30/2006 showed an ill-defined enhancing mass in the right breast measuring 2.6 x 4.0 x 4.1 cm. Enhancement trailed out to the nipple but did not involve the nipple. There was also a small enhancing internal mammary node measuring 7 mm suspicious for metastatic disease. Left breast was negative.  #2 patient was seen by Dr. Beatris [name] on 09/09/2006. She received neoadjuvant chemotherapy initially consisting of FEC regimen given dose dense for 4 cycles followed by Taxotere. She received her chemotherapy she received her chemotherapy from 09/20/2006 through February 2008. She then went on to lumpectomy on 02/07/2007. However there was essentially residual tumor dispersed over an area 1.8 cm with some close surgical margins at the medial area. Her case was reviewed at the breast conference and it was suggested she undergo mastectomy. Patient underwent a mastectomy on 03/19/2007.  #3 she was subsequently seen by radiation oncology for consideration of post mastectomy radiation therapy. However she was not given radiation.  #4 she was then begun on Femara 2.5 mg daily.  She has now completed 5 years of therapy. We will discontinue it June 2014.  CURRENT THERAPY: Observation  INTERVAL HISTORY: Gina [name] 67 y.o. female returns for followup visit. Overall she's doing well she is without any complaints. She has no evidence of local recurrence or distant recurrence. She denies any fevers chills night sweats headaches shortness of breath chest pains palpitations no myalgias and arthralgias. I have recommended she take vitamin D and calcium. [name] is negative.  MEDICAL HISTORY: Past Medical History  Diagnosis Date  . Breast cancer   . Breast cancer 04/28/2013    ALLERGIES:  has No Known Allergies.  MEDICATIONS:  Current Outpatient Prescriptions  Medication Sig Dispense Refill  . aspirin 81 MG tablet Take 81 mg by mouth daily.        . cetirizine (ZYRTEC) 10 MG tablet Take 10 mg by mouth daily.        Marland Kitchen FeFum-FePo-FA-B Cmp-C-Zn-Mn-Cu (SE-TAN PLUS) 162-115.2-1 MG CAPS TAKE ONE CAPSULE BY MOUTH EVERY DAY ON AN EMPTY STOMACH  30 capsule  PRN  . montelukast (SINGULAIR) 10 MG tablet Take 10 mg by mouth at bedtime.      . calcium-vitamin D (OSCAL WITH D) 500-200 MG-UNIT per tablet Take 2 tablets by mouth daily.  60 tablet  12  . Cholecalciferol (VITAMIN D3) 2000 UNITS TABS Take 2,000 Units by mouth daily.  90 tablet  12   No current facility-administered medications for this visit.    SURGICAL HISTORY: History reviewed. No pertinent past surgical history.  REVIEW OF SYSTEMS:  Pertinent items are noted in HPI.   HEALTH MAINTENANCE:    PHYSICAL EXAMINATION: Blood pressure 110/66, pulse 77, temperature 98.5  F (36.9 C), temperature source Oral, resp. rate 20, height 5' 3.5" (1.613 m), weight 111 lb 11.2 oz (50.667 kg). Body mass index is 19.47 kg/(m^2). ECOG PERFORMANCE STATUS: 0 - Asymptomatic   General appearance: alert, cooperative and appears stated age Resp: clear to auscultation bilaterally Cardio: regular rate  and rhythm GI: soft, non-tender; bowel sounds normal; no masses,  no organomegaly Extremities: extremities normal, atraumatic, no cyanosis or edema Neurologic: Grossly normal   LABORATORY DATA: Lab Results  Component Value Date   WBC 5.2 04/17/2013   HGB 12.2 04/17/2013   HCT 36.3 04/17/2013   MCV 92.0 04/17/2013   PLT 190 04/17/2013      Chemistry      Component Value Date/Time   NA 142 04/17/2013 1323   NA 141 04/17/2012 1314   K 4.0 04/17/2013 1323   K 3.9 04/17/2012 1314   CL 105 04/17/2013 1323   CL 105 04/17/2012 1314   CO2 29 04/17/2013 1323   CO2 30 04/17/2012 1314   BUN 13.5 04/17/2013 1323   BUN 15 04/17/2012 1314   CREATININE 0.8 04/17/2013 1323   CREATININE 0.87 04/17/2012 1314      Component Value Date/Time   CALCIUM 9.3 04/17/2013 1323   CALCIUM 9.5 04/17/2012 1314   ALKPHOS 146 04/17/2013 1323   ALKPHOS 68 04/17/2012 1314   AST 29 04/17/2013 1323   AST 21 04/17/2012 1314   ALT 17 04/17/2013 1323   ALT 16 04/17/2012 1314   BILITOT 0.39 04/17/2013 1323   BILITOT 0.5 04/17/2012 1314    REPORT OF SURGICAL PATHOLOGY  Case #: [id number] Patient Name: Gina [name], [name] Office Chart Number: N/A  MRN: [id number] Pathologist: Gina Moros, MD DOB/Age 12-09-1945 (Age: 67) Gender: F Date Taken: 02/07/2007 Date Received: 02/07/2007  FINAL DIAGNOSIS  MICROSCOPIC EXAMINATION AND DIAGNOSIS  1. RIGHT AXILLARY SENTINEL LYMPH NODE, EXCISION: - METASTATIC CARCINOMA CONSISTENT WITH BREAST PRIMARY, SEE COMMENT.  2. RIGHT BREAST, NEEDLE LOCALIZATION BIOPSY: - RESIDUAL CARCINOMA IN TUMOR REGRESSION AREA. - CARCINOMA IS CLOSE TO MEDIAL AND LATERAL SURGICAL MARGINS OF RESECTION. - SEE ONCOLOGY TABLE  COMMENT 1. The sections show metastatic carcinoma on H The tumor is seen in the form of dispersed atypical epithelioid cells in a fibrotic background involving an area more than 0.2 cm consistent with metastatic carcinoma. No extranodal extension is identified. For confirmation, Cytokeratin  stain (AE1/AE3) was performed and is positive. The control stained appropriately.  ONCOLOGY TABLE-BREAST, INCISIONAL/EXCISIONAL BIOPSY OR MASTECTOMY WITH LYMPH NODES  1. Maximum tumor size (cm): Dispersed tumor cells in a tumor regression area measuring 1.8 cm, see comment 2. Margins: Tumor cells are seen less than 0.1 cm from the surgical margins of resection Invasive component, distance to closest margin: Less than 0.1 cm In situ component, distance to closest margin: N/A 3. Vascular/Lymphatic invasion: Yes 4. Histology, invasive component: Invasive mammary carcinoma consistent with lobular carcinoma 5. Grade, invasive component (Elston-Ellis modified Scarff-Bloom-Richardson): II Tubule formation grade: 3 Nuclear pleomorphism grade: 2 Mitotic grade: 1 6. Extensive intraductal component (JCRT): No 7. Histology, in situ component: N/A 8. Grade, in situ component: N/A 9. Multicentric (separate tumors in different quadrants): Unknown 10. Multifocal (separate tumors in same quadrant or biopsy): No 11. Axillary lymph nodes: #examined: 1 #with metastasis: 1 ITC (isolated tumor cells, < 0.5mm): 0 Micrometastasis (> 0.60mm, < 2mm): 0 Metastasis > 2 mm: 1 Extracapsular extension: No 12. TNM Code: ypT1, pN1a, pMX 13. Breast Prognostic Markers: Case number [id number] Estrogen receptor positive (100%) Progesterone receptor positive (  3%) Ki 67 (Mib-1) 12% Her 2 neu (HercepTest) 2+ Her 2 neu by FISH N/A 14. Non-neoplastic breast: Fibrosis 15. Comments: Sections of the localized tissue show a fibrotic area measuring 1.8 cm and displaying dispersed atypical epithelioid cells throughout that area. Some of the cells surround the benign appearing ductal elements. The findings are subtle and most compatible with residual carcinoma. For confirmation cytokeratin stains were performed on several blocks representing the localized tissue and show strong positivity in previously described  epithelioid cells confirming the morphologic impression of residual carcinoma. The carcinoma is less than 0.1 cm from the medial surgical margin of resection and approximately 0.1 cm from the lateral surgical margin of resection. Since the findings are subtle and the tumor is essentially composed of dispersed cells throughout, the status of the medial and lateral margins in particular may be questionable and hence truly negative margins cannot be assured. I strongly recommend clinical correlation and followup. (BNS:caf 02/12/07)      RADIOGRAPHIC STUDIES:  No results found.  ASSESSMENT: 67 year old female with  #1 history of stage II invasive mammary carcinoma with lobular features originally diagnosed in 2007. She underwent neoadjuvant chemotherapy consisting of 4 cycles of dose dense FEC followed by dose dense Taxotere for 4 cycles. She then had a lumpectomy that revealed residual disease. She proceeded on to have a mastectomy. She was not a candidate for radiation therapy even though she did have an evaluation by radiation oncology.  #2 Dr. Donnie Coffin placed her on Femara 2.5 mg daily she overall tolerated it well. She is now finishing up 5 years of Femara. She will discontinue this.  #3 she has no evidence of recurrent disease.   PLAN:   #1 patient will continue calcium and vitamin D.  #2 I will see her back in one year time   All questions were answered. The patient knows to call the clinic with any problems, questions or concerns. We can certainly see the patient much sooner if necessary.  I spent 25 minutes counseling the patient face to face. The total time spent in the appointment was 30 minutes.    Drue Second, MD Medical/Oncology [facility] [phone] (beeper) [phone] (Office)  05/22/2013, 10:21 AM

## 2013-07-06 ENCOUNTER — Other Ambulatory Visit: Payer: Self-pay | Admitting: Oncology

## 2013-07-06 DIAGNOSIS — C50919 Malignant neoplasm of unspecified site of unspecified female breast: Secondary | ICD-10-CM

## 2013-07-23 ENCOUNTER — Encounter (HOSPITAL_COMMUNITY): Payer: Self-pay

## 2013-07-23 ENCOUNTER — Emergency Department (HOSPITAL_COMMUNITY)
Admission: EM | Admit: 2013-07-23 | Discharge: 2013-07-24 | Disposition: A | Discharge status: Home / Self Care | Payer: 59 | Attending: Emergency Medicine | Admitting: Emergency Medicine

## 2013-07-23 DIAGNOSIS — R5381 Other malaise: Secondary | ICD-10-CM | POA: Insufficient documentation

## 2013-07-23 DIAGNOSIS — IMO0001 Reserved for inherently not codable concepts without codable children: Secondary | ICD-10-CM | POA: Insufficient documentation

## 2013-07-23 DIAGNOSIS — D649 Anemia, unspecified: Secondary | ICD-10-CM

## 2013-07-23 DIAGNOSIS — Z79899 Other long term (current) drug therapy: Secondary | ICD-10-CM | POA: Insufficient documentation

## 2013-07-23 DIAGNOSIS — K921 Melena: Secondary | ICD-10-CM | POA: Insufficient documentation

## 2013-07-23 DIAGNOSIS — Z9221 Personal history of antineoplastic chemotherapy: Secondary | ICD-10-CM | POA: Insufficient documentation

## 2013-07-23 DIAGNOSIS — Z7982 Long term (current) use of aspirin: Secondary | ICD-10-CM | POA: Insufficient documentation

## 2013-07-23 DIAGNOSIS — Z853 Personal history of malignant neoplasm of breast: Secondary | ICD-10-CM | POA: Insufficient documentation

## 2013-07-23 DIAGNOSIS — R195 Other fecal abnormalities: Secondary | ICD-10-CM | POA: Insufficient documentation

## 2013-07-23 LAB — CBC WITH DIFFERENTIAL/PLATELET
Eosinophils Absolute: 0 10*3/uL (ref 0.0–0.7)
HCT: 26.6 % — ABNORMAL LOW (ref 36.0–46.0)
Lymphocytes Relative: 35 % (ref 12–46)
Lymphs Abs: 1.7 10*3/uL (ref 0.7–4.0)
MCH: 28.6 pg (ref 26.0–34.0)
MCHC: 32.3 g/dL (ref 30.0–36.0)
Monocytes Absolute: 0.6 10*3/uL (ref 0.1–1.0)
Neutrophils Relative %: 49 % (ref 43–77)
RDW: 16.6 % — ABNORMAL HIGH (ref 11.5–15.5)
nRBC: 4 /100 WBC — ABNORMAL HIGH

## 2013-07-23 LAB — BASIC METABOLIC PANEL
Calcium: 9.7 mg/dL (ref 8.4–10.5)
GFR calc non Af Amer: 71 mL/min — ABNORMAL LOW (ref >90)
Potassium: 3.9 mEq/L (ref 3.5–5.1)
Sodium: 141 mEq/L (ref 135–145)

## 2013-07-23 LAB — OCCULT BLOOD, POC DEVICE: Fecal Occult Bld: NEGATIVE

## 2013-07-23 LAB — APTT: aPTT: 27 seconds (ref 24–37)

## 2013-07-23 NOTE — ED Notes (Signed)
Pt was sent here by Dr Azucena Kuba, her hemoglobin is low and she complains of some rectal bleeding, pt is a Jehov[employer] Witness and not allowed to receive blood products

## 2013-07-23 NOTE — ED Provider Notes (Signed)
CSN: [id number]     Arrival date & time 07/23/13  1919 History   First MD Initiated Contact with Patient 07/23/13 2102     Chief Complaint  Patient presents with  . GI Bleeding   (Consider location/radiation/quality/duration/timing/severity/associated sxs/prior Treatment) HPI Comments: Patient with h/o breast CA, NSAID use, normal colonoscopy 5 years ago -- presents with c/o decreased energy, dark stools, anemia for the past several weeks. She denies other bleeding or bruising. No fever. No nausea, vomiting, diarrhea, urinary symptoms. Patient was sent to the emergency department by her primary care physician for evaluation after hgb was found to be in 7's. . She is a Jehovah witness and will not accept blood transfusion. She has been on iron supplementation since her chemotherapy for breast cancer. Onset of symptoms acute. Course is constant. Nothing makes symptoms better or worse.  The history is provided by the patient.    Past Medical History  Diagnosis Date  . Breast cancer   . Breast cancer 04/28/2013   History reviewed. No pertinent past surgical history. History reviewed. No pertinent family history. History  Substance Use Topics  . Smoking status: Never Smoker   . Smokeless tobacco: Never Used  . Alcohol Use: 0.6 oz/week    1 Glasses of wine per week   OB History   Grav Para Term Preterm Abortions TAB SAB Ect Mult Living                 Review of Systems  Constitutional: Positive for fatigue. Negative for fever.  HENT: Negative for sore throat and rhinorrhea.   Eyes: Negative for redness.  Respiratory: Negative for cough.   Cardiovascular: Negative for chest pain.  Gastrointestinal: Positive for blood in stool. Negative for nausea, vomiting, abdominal pain and diarrhea.  Genitourinary: Negative for dysuria and hematuria.  Musculoskeletal: Positive for myalgias.  Skin: Negative for rash.  Neurological: Negative for headaches.    Allergies  Sudafed  Home  Medications   Current Outpatient Rx  Name  Route  Sig  Dispense  Refill  . aspirin 81 MG tablet   Oral   Take 81 mg by mouth daily.           . calcium-vitamin D (OSCAL WITH D) 500-200 MG-UNIT per tablet   Oral   Take 2 tablets by mouth daily.   60 tablet   12   . cetirizine (ZYRTEC) 10 MG tablet   Oral   Take 10 mg by mouth daily.           . Cholecalciferol (VITAMIN D3) 2000 UNITS TABS   Oral   Take 2,000 Units by mouth daily.   90 tablet   12   . FeFum-FePo-FA-B Cmp-C-Zn-Mn-Cu (SE-TAN PLUS) 162-115.2-1 MG CAPS      TAKE ONE CAPSULE BY MOUTH EVERY DAY ON AN EMPTY STOMACH   30 capsule   PRN   . montelukast (SINGULAIR) 10 MG tablet   Oral   Take 10 mg by mouth at bedtime.          BP 135/59  Pulse 96  Temp(Src) 98.6 F (37 C) (Oral)  Resp 18  SpO2 98% Physical Exam  Nursing note and vitals reviewed. Constitutional: She appears well-developed and well-nourished.  HENT:  Head: Normocephalic and atraumatic.  Conjunctiva mildly pale  Eyes: Conjunctivae are normal. Right eye exhibits no discharge. Left eye exhibits no discharge.  Neck: Normal range of motion. Neck supple.  Cardiovascular: Normal rate, regular rhythm and normal heart sounds.  Pulmonary/Chest: Effort normal and breath sounds normal.  Abdominal: Soft. There is no tenderness.  Genitourinary: Rectal exam shows no external hemorrhoid, no internal hemorrhoid, no fissure, no mass, no tenderness and anal tone normal. Guaiac negative stool.  Neurological: She is alert.  Skin: Skin is warm and dry.  Psychiatric: She has a normal mood and affect.    ED Course  Procedures (including critical care time) Labs Review Labs Reviewed  CBC WITH DIFFERENTIAL - Abnormal; Notable for the following:    RBC 3.01 (*)    Hemoglobin 8.6 (*)    HCT 26.6 (*)    RDW 16.6 (*)    Platelets 132 (*)    Basophils Relative 3 (*)    nRBC 4 (*)    All other components within normal limits  BASIC METABOLIC PANEL -  Abnormal; Notable for the following:    GFR calc non Af Amer 71 (*)    GFR calc Af Amer 83 (*)    All other components within normal limits  PROTIME-INR  APTT  OCCULT BLOOD X 1 CARD TO LAB, STOOL  OCCULT BLOOD, POC DEVICE   Imaging Review No results found.  9:24 PM Patient seen and examined. Work-up initiated.         Vital signs reviewed and are as follows: Filed Vitals:   07/23/13 1954  BP: 135/59  Pulse: 96  Temp: 98.6 F (37 C)  Resp: 18   Hgb here is 8.6. Hemoccult is neg. Pt is stable for outpt follow-up. Referral given.   Patient urged to return with worsening symptoms or other concerns. Patient verbalized understanding and agrees with plan.   MDM   1. Anemia    Patient with anemia, she is Jehovah's Witness. Concern was for hgb trending down over past several weeks. She does not appear to have active bleeding and hgb here is higher than what triggered ED visit. She appears very well. No sx of anemia other than fatigue. She is stable for d/c.     Renne Crigler, PA-C 07/24/13 [phone]

## 2013-07-24 NOTE — ED Provider Notes (Signed)
Medical screening examination/treatment/procedure(s) were conducted as a shared visit with non-physician practitioner(s) and myself.  I personally evaluated the patient during the encounter  Patient presents to the ER for evaluation of anemia. Patient has had chronic anemia, on iron replacement. Blood work as an outpatient showed decreased hemoglobin, sent to the ER for further evaluation. Patient is in no distress, vital signs stable. Blood work here is consistent with previous blood work, to drop her hemoglobin to her baseline. Patient to be discharged, followup with primary doctor.  Gilda Crease, MD 07/27/13 [phone]

## 2013-07-28 ENCOUNTER — Telehealth: Payer: Self-pay | Admitting: *Deleted

## 2013-07-28 ENCOUNTER — Telehealth: Payer: Self-pay | Admitting: Medical Oncology

## 2013-07-28 ENCOUNTER — Encounter: Payer: Self-pay | Admitting: Gastroenterology

## 2013-07-28 ENCOUNTER — Encounter: Payer: Self-pay | Admitting: *Deleted

## 2013-07-28 NOTE — Telephone Encounter (Signed)
Jerene Dilling called back and left a message for [place] to cancel pt's appt; she was not aware pt had seen Dr Kinnie Scales.

## 2013-07-28 NOTE — Telephone Encounter (Signed)
Received a referral from Dr Nicholos Johns requesting an appt for pt with anemia. Per Dr Reade's of notes, previous COLON was done by Dr Kinnie Scales about 4 years ago. lmom for Jerene Dilling to call back.

## 2013-07-28 NOTE — Telephone Encounter (Signed)
Patient LVMOM, stating she was in "ED 08/28 with acute anemia, increased WBC, increased liver count and no energy. I've lost 12 pounds since my last office visit with Dr Welton Flakes." Reviewed pts concerns with MD. Patient to see Augustin Schooling, NP this week. Patient informed and knows to expect call from scheduling for appt. No further questions at this time.  Onc tx sent.

## 2013-07-31 ENCOUNTER — Telehealth: Payer: Self-pay | Admitting: Oncology

## 2013-07-31 NOTE — Telephone Encounter (Signed)
S/w the pt and she is aware of her appts on 08/05/2013.

## 2013-08-03 ENCOUNTER — Other Ambulatory Visit: Payer: Self-pay | Admitting: Emergency Medicine

## 2013-08-03 DIAGNOSIS — C50919 Malignant neoplasm of unspecified site of unspecified female breast: Secondary | ICD-10-CM

## 2013-08-05 ENCOUNTER — Encounter: Payer: Self-pay | Admitting: Adult Health

## 2013-08-05 ENCOUNTER — Ambulatory Visit (HOSPITAL_BASED_OUTPATIENT_CLINIC_OR_DEPARTMENT_OTHER): Payer: 59 | Admitting: Adult Health

## 2013-08-05 ENCOUNTER — Ambulatory Visit: Payer: 59 | Admitting: Adult Health

## 2013-08-05 ENCOUNTER — Telehealth: Payer: Self-pay | Admitting: Oncology

## 2013-08-05 ENCOUNTER — Other Ambulatory Visit (HOSPITAL_BASED_OUTPATIENT_CLINIC_OR_DEPARTMENT_OTHER): Payer: 59 | Admitting: Lab

## 2013-08-05 VITALS — BP 115/70 | HR 100 | Temp 98.3°F | Resp 18 | Ht 63.0 in | Wt 96.7 lb

## 2013-08-05 DIAGNOSIS — D649 Anemia, unspecified: Secondary | ICD-10-CM

## 2013-08-05 DIAGNOSIS — C50919 Malignant neoplasm of unspecified site of unspecified female breast: Secondary | ICD-10-CM

## 2013-08-05 DIAGNOSIS — R52 Pain, unspecified: Secondary | ICD-10-CM

## 2013-08-05 DIAGNOSIS — C50911 Malignant neoplasm of unspecified site of right female breast: Secondary | ICD-10-CM

## 2013-08-05 DIAGNOSIS — E559 Vitamin D deficiency, unspecified: Secondary | ICD-10-CM

## 2013-08-05 LAB — RETICULOCYTES: Immature Retic Fract: 26.6 % — ABNORMAL HIGH (ref 1.60–10.00)

## 2013-08-05 LAB — COMPREHENSIVE METABOLIC PANEL (CC13)
ALT: 10 U/L (ref 0–55)
CO2: 28 mEq/L (ref 22–29)
Chloride: 96 mEq/L — ABNORMAL LOW (ref 98–109)
Sodium: 135 mEq/L — ABNORMAL LOW (ref 136–145)
Total Bilirubin: 0.85 mg/dL (ref 0.20–1.20)
Total Protein: 7.7 g/dL (ref 6.4–8.3)

## 2013-08-05 LAB — IRON AND TIBC CHCC
Iron: 50 ug/dL (ref 41–142)
TIBC: 259 ug/dL (ref 236–444)

## 2013-08-05 LAB — CBC WITH DIFFERENTIAL/PLATELET
BASO%: 3.3 % — ABNORMAL HIGH (ref 0.0–2.0)
MCHC: 32.3 g/dL (ref 31.5–36.0)
MONO#: 0.9 10*3/uL (ref 0.1–0.9)
RBC: 2.97 10*6/uL — ABNORMAL LOW (ref 3.70–5.45)
RDW: 17.2 % — ABNORMAL HIGH (ref 11.2–14.5)
WBC: 5.8 10*3/uL (ref 3.9–10.3)
lymph#: 1.5 10*3/uL (ref 0.9–3.3)
nRBC: 15 % — ABNORMAL HIGH (ref 0–0)

## 2013-08-05 LAB — TECHNOLOGIST REVIEW

## 2013-08-05 LAB — LACTATE DEHYDROGENASE (CC13): LDH: 892 U/L — ABNORMAL HIGH (ref 125–245)

## 2013-08-05 MED ORDER — TRAMADOL HCL 50 MG PO TABS: 25.0000 mg | ORAL_TABLET | Freq: Four times a day (QID) | ORAL | Status: DC | PRN

## 2013-08-05 NOTE — Patient Instructions (Addendum)
Anemia, Frequently Asked Questions WHAT ARE THE SYMPTOMS OF ANEMIA?  Headache.  Difficulty thinking.  Fatigue.  Shortness of breath.  Weakness.  Rapid heartbeat. AT WHAT POINT ARE PEOPLE CONSIDERED ANEMIC?  This varies with gender and age.   Both hemoglobin (Hgb) and hematocrit values are used to define anemia. These lab values are obtained from a complete blood count (CBC) test. This is performed at a caregiver's office.  The normal range of hemoglobin values for adult men is 14.0 g/dL to 17.4 g/dL. For nonpregnant women, values are 12.3 g/dL to 15.3 g/dL.  The World Health Organization defines anemia as less than 12 g/dL for nonpregnant women and less than 13 g/dL for men.  For adult males, the average normal hematocrit is 46%, and the range is 40% to 52%.  For adult females, the average normal hematocrit is 41%, and the range is 35% to 47%.  Values that fall below the lower limits can be a sign of anemia and should have further checking (evaluation). GROUPS OF PEOPLE WHO ARE AT RISK FOR DEVELOPING ANEMIA INCLUDE:   Infants who are breastfed or taking a formula that is not fortified with iron.  Children going through a rapid growth spurt. The iron available can not keep up with the needs for a red cell mass which must grow with the child.  Women in childbearing years. They need iron because of blood loss during menstruation.  Pregnant women. The growing fetus creates a high demand for iron.  People with ongoing gastrointestinal blood loss are at risk of developing iron deficiency.  Individuals with leukemia or cancer who must receive chemotherapy or radiation to treat their disease. The drugs or radiation used to treat these diseases often decreases the bone marrow's ability to make cells of all classes. This includes red blood cells, white blood cells, and platelets.  Individuals with chronic inflammatory conditions such as rheumatoid arthritis or chronic  infections.  The elderly. ARE SOME TYPES OF ANEMIA INHERITED?   Yes, some types of anemia are due to inherited or genetic defects.  Sickle cell anemia. This occurs most often in people of African, African American, and Mediterranean descent.  Thalassemia (or Cooley's anemia). This type is found in people of Mediterranean and Southeast Asian descent. These types of anemia are common.  [place]i. This is rare. CAN CERTAIN MEDICATIONS CAUSE A PERSON TO BECOME ANEMIC?  Yes. For example, drugs to fight cancer (chemotherapeutic agents) often cause anemia. These drugs can slow the bone marrow's ability to make red blood cells. If there are not enough red blood cells, the body does not get enough oxygen. WHAT HEMATOCRIT LEVEL IS REQUIRED TO DONATE BLOOD?  The lower limit of an acceptable hematocrit for blood donors is 38%. If you have a low hematocrit value, you should schedule an appointment with your caregiver. ARE BLOOD TRANSFUSIONS COMMONLY USED TO CORRECT ANEMIA, AND ARE THEY DANGEROUS?  They are used to treat anemia as a last resort. Your caregiver will find the cause of the anemia and correct it if possible. Most blood transfusions are given because of excessive bleeding at the time of surgery, with trauma, or because of bone marrow suppression in patients with cancer or leukemia on chemotherapy. Blood transfusions are safer than ever before. We also know that blood transfusions affect the immune system and may increase certain risks. There is also a concern for human error. In 1/16,000 transfusions, a patient receives a transfusion of blood that is not matched with his or her   blood type.  WHAT IS IRON DEFICIENCY ANEMIA AND CAN I CORRECT IT BY CHANGING MY DIET?  Iron is an essential part of hemoglobin. Without enough hemoglobin, anemia develops and the body does not get the right amount of oxygen. Iron deficiency anemia develops after the body has had a low level of iron for a long time. This is  either caused by blood loss, not taking in or absorbing enough iron, or increased demands for iron (like pregnancy or rapid growth).  Foods from animal origin such as beef, chicken, and pork, are good sources of iron. Be sure to have one of these foods at each meal. Vitamin C helps your body absorb iron. Foods rich in Vitamin C include citrus, bell pepper, strawberries, spinach and cantaloupe. In some cases, iron supplements may be needed in order to correct the iron deficiency. In the case of poor absorption, extra iron may have to be given directly into the vein through a needle (intravenously). I HAVE BEEN DIAGNOSED WITH IRON DEFICIENCY ANEMIA AND MY CAREGIVER PRESCRIBED IRON SUPPLEMENTS. HOW LONG WILL IT TAKE FOR MY BLOOD TO BECOME NORMAL?  It depends on the degree of anemia at the beginning of treatment. Most people with mild to moderate iron deficiency, anemia will correct the anemia over a period of 2 to 3 months. But after the anemia is corrected, the iron stored by the body is still low. Caregivers often suggest an additional 6 months of oral iron therapy once the anemia has been reversed. This will help prevent the iron deficiency anemia from quickly happening again. Non-anemic adult males should take iron supplements only under the direction of a doctor, too much iron can cause liver damage.  MY HEMOGLOBIN IS 9 G/DL AND I AM SCHEDULED FOR SURGERY. SHOULD I POSTPONE THE SURGERY?  If you have Hgb of 9, you should discuss this with your caregiver right away. Many patients with similar hemoglobin levels have had surgery without problems. If minimal blood loss is expected for a minor procedure, no treatment may be necessary.  If a greater blood loss is expected for more extensive procedures, you should ask your caregiver about being treated with erythropoietin and iron. This is to accelerate the recovery of your hemoglobin to a normal level before surgery. An anemic patient who undergoes high-blood-loss  surgery has a greater risk of surgical complications and need for a blood transfusion, which also carries some risk.  I HAVE BEEN TOLD THAT HEAVY MENSTRUAL PERIODS CAUSE ANEMIA. IS THERE ANYTHING I CAN DO TO PREVENT THE ANEMIA?  Anemia that results from heavy periods is usually due to iron deficiency. You can try to meet the increased demands for iron caused by the heavy monthly blood loss by increasing the intake of iron-rich foods. Iron supplements may be required. Discuss your concerns with your caregiver. WHAT CAUSES ANEMIA DURING PREGNANCY?  Pregnancy places major demands on the body. The mother must meet the needs of both her body and her growing baby. The body needs enough iron and folate to make the right amount of red blood cells. To prevent anemia while pregnant, the mother should stay in close contact with her caregiver.  Be sure to eat a diet that has foods rich in iron and folate like liver and dark green leafy vegetables. Folate plays an important role in the normal development of a baby's spinal cord. Folate can help prevent serious disorders like spina bifida. If your diet does not provide adequate nutrients, you may want to talk   with your caregiver about nutritional supplements.  WHAT IS THE RELATIONSHIP BETWEEN FIBROID TUMORS AND ANEMIA IN WOMEN?  The relationship is usually caused by the increased menstrual blood loss caused by fibroids. Good iron intake may be required to prevent iron deficiency anemia from developing.  Document Released: 06/20/2004 Document Revised: 02/04/2012 Document Reviewed: 12/05/2010 [facility] Patient Information 2014 [place], [place].   Tramadol tablets What is this medicine? TRAMADOL (TRA ma dole) is a pain reliever. It is used to treat moderate to severe pain in adults. This medicine may be used for other purposes; ask your health care provider or pharmacist if you have questions. What should I tell my health care provider before I take this medicine? They  need to know if you have any of these conditions: -brain tumor -depression -drug abuse or addiction -head injury -if you frequently drink alcohol containing drinks -kidney disease or trouble passing urine -liver disease -lung disease, asthma, or breathing problems -seizures or epilepsy -suicidal thoughts, plans, or attempt; a previous suicide attempt by you or a family member -an unusual or allergic reaction to tramadol, codeine, other medicines, foods, dyes, or preservatives -pregnant or trying to get pregnant -breast-feeding How should I use this medicine? Take this medicine by mouth with a full glass of water. Follow the directions on the prescription label. If the medicine upsets your stomach, take it with food or milk. Do not take more medicine than you are told to take. Talk to your pediatrician regarding the use of this medicine in children. Special care may be needed. Overdosage: If you think you have taken too much of this medicine contact a poison control center or emergency room at once. NOTE: This medicine is only for you. Do not share this medicine with others. What if I miss a dose? If you miss a dose, take it as soon as you can. If it is almost time for your next dose, take only that dose. Do not take double or extra doses. What may interact with this medicine? Do not take this medicine with any of the following medications: -MAOIs like Carbex, Eldepryl, Marplan, Nardil, and Parnate This medicine may also interact with the following medications: -alcohol or medicines that contain alcohol -antihistamines -benzodiazepines -bupropion -carbamazepine or oxcarbazepine -clozapine -cyclobenzaprine -digoxin -furazolidone -linezolid -medicines for depression, anxiety, or psychotic disturbances -medicines for migraine headache like almotriptan, eletriptan, frovatriptan, naratriptan, rizatriptan, sumatriptan, zolmitriptan -medicines for pain like pentazocine, buprenorphine,  butorphanol, meperidine, nalbuphine, and propoxyphene -medicines for sleep -muscle relaxants -naltrexone -phenobarbital -phenothiazines like perphenazine, thioridazine, chlorpromazine, mesoridazine, fluphenazine, prochlorperazine, promazine, and trifluoperazine -procarbazine -warfarin This list may not describe all possible interactions. Give your health care provider a list of all the medicines, herbs, non-prescription drugs, or dietary supplements you use. Also tell them if you smoke, drink alcohol, or use illegal drugs. Some items may interact with your medicine. What should I watch for while using this medicine? Tell your doctor or health care professional if your pain does not go away, if it gets worse, or if you have new or a different type of pain. You may develop tolerance to the medicine. Tolerance means that you will need a higher dose of the medicine for pain relief. Tolerance is normal and is expected if you take this medicine for a long time. Do not suddenly stop taking your medicine because you may develop a severe reaction. Your body becomes used to the medicine. This does NOT mean you are addicted. Addiction is a behavior related to getting and using  a drug for a non-medical reason. If you have pain, you have a medical reason to take pain medicine. Your doctor will tell you how much medicine to take. If your doctor wants you to stop the medicine, the dose will be slowly lowered over time to avoid any side effects. You may get drowsy or dizzy. Do not drive, use machinery, or do anything that needs mental alertness until you know how this medicine affects you. Do not stand or sit up quickly, especially if you are an older patient. This reduces the risk of dizzy or fainting spells. Alcohol can increase or decrease the effects of this medicine. Avoid alcoholic drinks. You may have constipation. Try to have a bowel movement at least every 2 to 3 days. If you do not have a bowel movement for 3  days, call your doctor or health care professional. Your mouth may get dry. Chewing sugarless gum or sucking hard candy, and drinking plenty of water may help. Contact your doctor if the problem does not go away or is severe. What side effects may I notice from receiving this medicine? Side effects that you should report to your doctor or health care professional as soon as possible: -allergic reactions like skin rash, itching or hives, swelling of the face, lips, or tongue -breathing difficulties, wheezing -confusion -itching -light headedness or fainting spells -redness, blistering, peeling or loosening of the skin, including inside the mouth -seizures Side effects that usually do not require medical attention (report to your doctor or health care professional if they continue or are bothersome): -constipation -dizziness -drowsiness -headache -nausea, vomiting This list may not describe all possible side effects. Call your doctor for medical advice about side effects. You may report side effects to FDA at 1-800-FDA-1088. Where should I keep my medicine? Keep out of the reach of children. Store at room temperature between 15 and 30 degrees C (59 and 86 degrees F). Keep container tightly closed. Throw away any unused medicine after the expiration date. NOTE: This sheet is a summary. It may not cover all possible information. If you have questions about this medicine, talk to your doctor, pharmacist, or health care provider.  2013, [employer]vier/[employer] Standard. (07/26/2010 11:55:44 AM)

## 2013-08-05 NOTE — Telephone Encounter (Signed)
, °

## 2013-08-05 NOTE — Progress Notes (Signed)
OFFICE PROGRESS NOTE  CC  Gina Hoff, MD [address] [place] [address] invasive mammary carcinoma with lobular features. Diagnosed in October 2007 Dr. Teodora [name] Dr.Daniel [name]  DIAGNOSIS: 67 year old female with diagnosis of stage II (T1 N1)  PRIOR THERAPY:  #1 patient presented with a mass in her right breast in October 2007. She underwent bilateral mammograms. The right breast ultrasound performed on 08/19/2006 showed increased density and distortion in the subareolar location of the right breast. Ultrasound showed abnormal echoes in the subareolar location which was a change measuring 1.5 cm. Patient underwent an ultrasound-guided biopsy on 08/23/2006 which showed invasive mammary carcinoma with lobular features. ER +100% PR 3% proliferation marker Ki-67 12% HER-2/neu 2+ by fish. MRI of the breasts on 08/30/2006 showed an ill-defined enhancing mass in the right breast measuring 2.6 x 4.0 x 4.1 cm. Enhancement trailed out to the nipple but did not involve the nipple. There was also a small enhancing internal mammary node measuring 7 mm suspicious for metastatic disease. Left breast was negative.  #2 patient was seen by Dr. Beatris [name] on 09/09/2006. She received neoadjuvant chemotherapy initially consisting of FEC regimen given dose dense for 4 cycles followed by Taxotere. She received her chemotherapy she received her chemotherapy from 09/20/2006 through February 2008. She then went on to lumpectomy on 02/07/2007. However there was essentially residual tumor dispersed over an area 1.8 cm with some close surgical margins at the medial area. Her case was reviewed at the breast conference and it was suggested she undergo mastectomy. Patient underwent a mastectomy on 03/19/2007.  #3 she was subsequently seen by radiation oncology for consideration of post mastectomy radiation therapy. However she was not given radiation.  #4 she was then begun on Femara 2.5 mg daily.  She has now completed 5 years of therapy. We will discontinue it June 2014.  CURRENT THERAPY: Observation  INTERVAL HISTORY: Gina [name] 67 y.o. female returns for an urgent visit due to anemia.  This all started about 5 weeks ago she developed generalized aches and pain.s  She went back to her PCP on 07/07/13 c/o possible fibromyalgia pain.  She had accompanying fatigue.  She was evaluated with labs, and given Naproxen BID for the pain.  She was found to be anemic.  She had a colonoscopy 4 years prior with Dr. Lottie [name].  She had an upper endoscopy that noted gastric irritation or GI cause for blood loss, so she was referred back to [place].  She endorses weight loss, severe fatigue, and weakness since becoming anemic.  She denies fevers, chills, nausea, vomiting, constipation, diarrhea, numbness or any other concerns. She does note that she is a [employer], however is willing to receive PRBCs if indicated.    MEDICAL HISTORY: Past Medical History  Diagnosis Date  . Breast cancer 04/28/2013  . Myalgia and myositis, unspecified   . Anemia     ALLERGIES:  is allergic to sudafed.  MEDICATIONS:  Current Outpatient Prescriptions  Medication Sig Dispense Refill  . aspirin 81 MG tablet Take 81 mg by mouth daily.        . calcium-vitamin D (OSCAL WITH D) 500-200 MG-UNIT per tablet Take 2 tablets by mouth daily.  60 tablet  12  . cetirizine (ZYRTEC) 10 MG tablet Take 10 mg by mouth daily.        . Cholecalciferol (VITAMIN D3) 2000 UNITS TABS Take 2,000 Units by mouth daily.  90 tablet  12  . FeFum-FePo-FA-B Cmp-C-Zn-Mn-Cu (SE-TAN PLUS)  162-115.2-1 MG CAPS TAKE ONE CAPSULE BY MOUTH EVERY DAY ON AN EMPTY STOMACH  30 capsule  PRN  . montelukast (SINGULAIR) 10 MG tablet Take 10 mg by mouth at bedtime.      . traMADol (ULTRAM) 50 MG tablet Take 0.5-1 tablets (25-50 mg total) by mouth every 6 (six) hours as needed for pain.  30 tablet  0   No current facility-administered medications for this visit.     SURGICAL HISTORY: No past surgical history on file.  REVIEW OF SYSTEMS:  A 10 point review of systems was conducted and is otherwise negative except for what is noted above.    PHYSICAL EXAMINATION: Blood pressure 115/70, pulse 100, temperature 98.3 F (36.8 C), temperature source Oral, resp. rate 18, height 5\' 3"  (1.6 m), weight 96 lb 11.2 oz (43.863 kg). Body mass index is 17.13 kg/(m^2). General: Patient is a well appearing female in no acute distress HEENT: PERRLA, sclerae anicteric no conjunctival pallor, MMM Neck: supple, no palpable adenopathy Lungs: clear to auscultation bilaterally, no wheezes, rhonchi, or rales Cardiovascular: regular rate rhythm, S1, S2, no murmurs, rubs or gallops Abdomen: Soft, non-tender, non-distended, normoactive bowel sounds, no HSM Extremities: warm and well perfused, no clubbing, cyanosis, or edema Skin:  Pale, No rashes or lesions Neuro: Non-focal Breasts: right mastectomy site no nodularity or sign of recurrence, left breast no masses or nodules ECOG PERFORMANCE STATUS: 0 - Asymptomatic  LABORATORY DATA: Lab Results  Component Value Date   WBC 5.8 08/05/2013   HGB 8.3* 08/05/2013   HCT 25.7* 08/05/2013   MCV 86.5 08/05/2013   PLT 119* 08/05/2013      Chemistry      Component Value Date/Time   NA 135* 08/05/2013 1040   NA 141 07/23/2013 2123   K 4.4 08/05/2013 1040   K 3.9 07/23/2013 2123   CL 106 07/23/2013 2123   CL 105 04/17/2013 1323   CO2 28 08/05/2013 1040   CO2 27 07/23/2013 2123   BUN 23.5 08/05/2013 1040   BUN 18 07/23/2013 2123   CREATININE 0.8 08/05/2013 1040   CREATININE 0.83 07/23/2013 2123      Component Value Date/Time   CALCIUM 9.6 08/05/2013 1040   CALCIUM 9.7 07/23/2013 2123   ALKPHOS 302* 08/05/2013 1040   ALKPHOS 68 04/17/2012 1314   AST 69* 08/05/2013 1040   AST 21 04/17/2012 1314   ALT 10 08/05/2013 1040   ALT 16 04/17/2012 1314   BILITOT 0.85 08/05/2013 1040   BILITOT 0.5 04/17/2012 1314    REPORT OF SURGICAL  PATHOLOGY  Case #: [id number] Patient Name: [name], [name] Office Chart Number: N/A  MRN: [id number] Pathologist: Gina Moros, MD DOB/Age [birth year] (Age: 55) Gender: F Date Taken: 02/07/2007 Date Received: 02/07/2007  FINAL DIAGNOSIS  MICROSCOPIC EXAMINATION AND DIAGNOSIS  1. RIGHT AXILLARY SENTINEL LYMPH NODE, EXCISION: - METASTATIC CARCINOMA CONSISTENT WITH BREAST PRIMARY, SEE COMMENT.  2. RIGHT BREAST, NEEDLE LOCALIZATION BIOPSY: - RESIDUAL CARCINOMA IN TUMOR REGRESSION AREA. - CARCINOMA IS CLOSE TO MEDIAL AND LATERAL SURGICAL MARGINS OF RESECTION. - SEE ONCOLOGY TABLE  COMMENT 1. The sections show metastatic carcinoma on H The tumor is seen in the form of dispersed atypical epithelioid cells in a fibrotic background involving an area more than 0.2 cm consistent with metastatic carcinoma. No extranodal extension is identified. For confirmation, Cytokeratin stain (AE1/AE3) was performed and is positive. The control stained appropriately.  ONCOLOGY TABLE-BREAST, INCISIONAL/EXCISIONAL BIOPSY OR MASTECTOMY WITH LYMPH NODES  1. Maximum tumor size (cm): Dispersed  tumor cells in a tumor regression area measuring 1.8 cm, see comment 2. Margins: Tumor cells are seen less than 0.1 cm from the surgical margins of resection Invasive component, distance to closest margin: Less than 0.1 cm In situ component, distance to closest margin: N/A 3. Vascular/Lymphatic invasion: Yes 4. Histology, invasive component: Invasive mammary carcinoma consistent with lobular carcinoma 5. Grade, invasive component (Elston-Ellis modified Scarff-Bloom-Richardson): II Tubule formation grade: 3 Nuclear pleomorphism grade: 2 Mitotic grade: 1 6. Extensive intraductal component (JCRT): No 7. Histology, in situ component: N/A 8. Grade, in situ component: N/A 9. Multicentric (separate tumors in different quadrants): Unknown 10. Multifocal (separate tumors in same quadrant or biopsy): No 11.  Axillary lymph nodes: #examined: 1 #with metastasis: 1 ITC (isolated tumor cells, < 0.68mm): 0 Micrometastasis (> 0.62mm, < 2mm): 0 Metastasis > 2 mm: 1 Extracapsular extension: No 12. TNM Code: ypT1, pN1a, pMX 13. Breast Prognostic Markers: Case number [id number] Estrogen receptor positive (100%) Progesterone receptor positive (3%) Ki 67 (Mib-1) 12% Her 2 neu (HercepTest) 2+ Her 2 neu by FISH N/A 14. Non-neoplastic breast: Fibrosis 15. Comments: Sections of the localized tissue show a fibrotic area measuring 1.8 cm and displaying dispersed atypical epithelioid cells throughout that area. Some of the cells surround the benign appearing ductal elements. The findings are subtle and most compatible with residual carcinoma. For confirmation cytokeratin stains were performed on several blocks representing the localized tissue and show strong positivity in previously described epithelioid cells confirming the morphologic impression of residual carcinoma. The carcinoma is less than 0.1 cm from the medial surgical margin of resection and approximately 0.1 cm from the lateral surgical margin of resection. Since the findings are subtle and the tumor is essentially composed of dispersed cells throughout, the status of the medial and lateral margins in particular may be questionable and hence truly negative margins cannot be assured. I strongly recommend clinical correlation and followup. (BNS:caf 02/12/07)  RADIOGRAPHIC STUDIES:  No results found.  ASSESSMENT: 67 year old female with  #1 history of stage II invasive mammary carcinoma with lobular features originally diagnosed in 2007. She underwent neoadjuvant chemotherapy consisting of 4 cycles of dose dense FEC followed by dose dense Taxotere for 4 cycles. She then had a lumpectomy that revealed residual disease. She proceeded on to have a mastectomy. She was not a candidate for radiation therapy even though she did have an evaluation by  radiation oncology.  #2 Dr. Donnie Coffin placed her on Femara 2.5 mg daily she overall tolerated it well. She is now finishing up 5 years of Femara. She discontinued this on 04/28/2013.  #3 Anemia   PLAN:   #1 Patient has a new anemia as compared to 3 months ago.  It has gone from 12.2 to 8.3.  We have ordered a CBC, reticulocyte count, haptoglobin, LDH, iron studies, B12, folate.  We also got a Vitamin D level on her.  She will have a CT chest/abdomen/pelvis as well.  Once we receive all these results we will decide whether or not she needs a bone marrow biopsy.  I discussed this with the patient and her family member in detail.    #2 She will return to clinic next week.    All questions were answered. The patient knows to call the clinic with any problems, questions or concerns. We can certainly see the patient much sooner if necessary.  I spent 40 minutes counseling the patient face to face. The total time spent in the appointment was 60 minutes.   Mardella Layman  Sunday Spillers, NP Medical Oncology [facility] Phone: [phone] 08/06/2013, 1:19 PM

## 2013-08-06 ENCOUNTER — Encounter (HOSPITAL_COMMUNITY): Payer: Self-pay

## 2013-08-06 ENCOUNTER — Ambulatory Visit (HOSPITAL_COMMUNITY)
Admission: RE | Admit: 2013-08-06 | Discharge: 2013-08-06 | Disposition: A | Discharge status: Home / Self Care | Payer: 59 | Source: Ambulatory Visit | Attending: Adult Health | Admitting: Adult Health

## 2013-08-06 DIAGNOSIS — R911 Solitary pulmonary nodule: Secondary | ICD-10-CM | POA: Insufficient documentation

## 2013-08-06 DIAGNOSIS — Z853 Personal history of malignant neoplasm of breast: Secondary | ICD-10-CM | POA: Insufficient documentation

## 2013-08-06 DIAGNOSIS — M948X9 Other specified disorders of cartilage, unspecified sites: Secondary | ICD-10-CM | POA: Insufficient documentation

## 2013-08-06 DIAGNOSIS — C50919 Malignant neoplasm of unspecified site of unspecified female breast: Secondary | ICD-10-CM

## 2013-08-06 DIAGNOSIS — E278 Other specified disorders of adrenal gland: Secondary | ICD-10-CM | POA: Insufficient documentation

## 2013-08-06 DIAGNOSIS — J61 Pneumoconiosis due to asbestos and other mineral fibers: Secondary | ICD-10-CM | POA: Insufficient documentation

## 2013-08-06 LAB — VITAMIN B12: Vitamin B-12: 750 pg/mL (ref 211–911)

## 2013-08-06 LAB — VITAMIN D 25 HYDROXY (VIT D DEFICIENCY, FRACTURES): Vit D, 25-Hydroxy: 27 ng/mL — ABNORMAL LOW (ref 30–89)

## 2013-08-06 MED ORDER — IOHEXOL 300 MG/ML  SOLN
100.0000 mL | Freq: Once | INTRAMUSCULAR | Status: AC | PRN
  Administered 2013-08-06: 100 mL via INTRAVENOUS

## 2013-08-07 ENCOUNTER — Ambulatory Visit (HOSPITAL_BASED_OUTPATIENT_CLINIC_OR_DEPARTMENT_OTHER): Payer: 59 | Admitting: Lab

## 2013-08-07 ENCOUNTER — Ambulatory Visit: Payer: 59 | Admitting: Lab

## 2013-08-07 ENCOUNTER — Encounter: Payer: Self-pay | Admitting: Adult Health

## 2013-08-07 ENCOUNTER — Ambulatory Visit (HOSPITAL_BASED_OUTPATIENT_CLINIC_OR_DEPARTMENT_OTHER): Payer: 59

## 2013-08-07 ENCOUNTER — Ambulatory Visit: Payer: 59 | Admitting: Gastroenterology

## 2013-08-07 ENCOUNTER — Ambulatory Visit (HOSPITAL_BASED_OUTPATIENT_CLINIC_OR_DEPARTMENT_OTHER): Payer: 59 | Admitting: Adult Health

## 2013-08-07 ENCOUNTER — Other Ambulatory Visit: Payer: Self-pay | Admitting: Emergency Medicine

## 2013-08-07 ENCOUNTER — Ambulatory Visit (HOSPITAL_COMMUNITY)
Admission: RE | Admit: 2013-08-07 | Discharge: 2013-08-07 | Disposition: A | Discharge status: Home / Self Care | Payer: 59 | Source: Ambulatory Visit | Attending: Oncology | Admitting: Oncology

## 2013-08-07 VITALS — BP 101/65 | HR 101 | Temp 99.9°F | Resp 20 | Ht 63.0 in | Wt 99.5 lb

## 2013-08-07 DIAGNOSIS — C50919 Malignant neoplasm of unspecified site of unspecified female breast: Secondary | ICD-10-CM

## 2013-08-07 DIAGNOSIS — D630 Anemia in neoplastic disease: Secondary | ICD-10-CM

## 2013-08-07 DIAGNOSIS — E860 Dehydration: Secondary | ICD-10-CM

## 2013-08-07 LAB — CBC WITH DIFFERENTIAL/PLATELET
Basophils Absolute: 0.1 10*3/uL (ref 0.0–0.1)
HCT: 21.1 % — ABNORMAL LOW (ref 34.8–46.6)
HGB: 6.7 g/dL — CL (ref 11.6–15.9)
MCH: 27.3 pg (ref 25.1–34.0)
MONO#: 0.7 10*3/uL (ref 0.1–0.9)
NEUT%: 61 % (ref 38.4–76.8)
WBC: 4.7 10*3/uL (ref 3.9–10.3)
lymph#: 1 10*3/uL (ref 0.9–3.3)

## 2013-08-07 LAB — HOLD TUBE, BLOOD BANK

## 2013-08-07 LAB — COMPREHENSIVE METABOLIC PANEL (CC13)
Albumin: 2.8 g/dL — ABNORMAL LOW (ref 3.5–5.0)
BUN: 18.5 mg/dL (ref 7.0–26.0)
CO2: 30 mEq/L — ABNORMAL HIGH (ref 22–29)
Calcium: 8.9 mg/dL (ref 8.4–10.4)
Chloride: 93 mEq/L — ABNORMAL LOW (ref 98–109)
Glucose: 118 mg/dl (ref 70–140)
Potassium: 4.6 mEq/L (ref 3.5–5.1)

## 2013-08-07 LAB — ABO/RH: ABO/RH(D): O POS

## 2013-08-07 MED ORDER — SODIUM CHLORIDE 0.9 % IV SOLN
Freq: Once | INTRAVENOUS | Status: AC
  Administered 2013-08-07: 17:00:00 via INTRAVENOUS

## 2013-08-07 NOTE — Patient Instructions (Addendum)
Dehydration, Adult Dehydration is when you lose more fluids from the body than you take in. Vital organs like the kidneys, brain, and heart cannot function without a proper amount of fluids and salt. Any loss of fluids from the body can cause dehydration.  CAUSES   Vomiting.  Diarrhea.  Excessive sweating.  Excessive urine output.  Fever. SYMPTOMS  Mild dehydration  Thirst.  Dry lips.  Slightly dry mouth. Moderate dehydration  Very dry mouth.  Sunken eyes.  Skin does not bounce back quickly when lightly pinched and released.  Dark urine and decreased urine production.  Decreased tear production.  Headache. Severe dehydration  Very dry mouth.  Extreme thirst.  Rapid, weak pulse (more than 100 beats per minute at rest).  Cold hands and feet.  Not able to sweat in spite of heat and temperature.  Rapid breathing.  Blue lips.  Confusion and lethargy.  Difficulty being awakened.  Minimal urine production.  No tears. DIAGNOSIS  Your caregiver will diagnose dehydration based on your symptoms and your exam. Blood and urine tests will help confirm the diagnosis. The diagnostic evaluation should also identify the cause of dehydration. TREATMENT  Treatment of mild or moderate dehydration can often be done at home by increasing the amount of fluids that you drink. It is best to drink small amounts of fluid more often. Drinking too much at one time can make vomiting worse. Refer to the home care instructions below. Severe dehydration needs to be treated at the hospital where you will probably be given intravenous (IV) fluids that contain water and electrolytes. HOME CARE INSTRUCTIONS   Ask your caregiver about specific rehydration instructions.  Drink enough fluids to keep your urine clear or pale yellow.  Drink small amounts frequently if you have nausea and vomiting.  Eat as you normally do.  Avoid:  Foods or drinks high in sugar.  Carbonated  drinks.  Juice.  Extremely hot or cold fluids.  Drinks with caffeine.  Fatty, greasy foods.  Alcohol.  Tobacco.  Overeating.  Gelatin desserts.  Wash your hands well to avoid spreading bacteria and viruses.  Only take over-the-counter or prescription medicines for pain, discomfort, or fever as directed by your caregiver.  Ask your caregiver if you should continue all prescribed and over-the-counter medicines.  Keep all follow-up appointments with your caregiver. SEEK MEDICAL CARE IF:  You have abdominal pain and it increases or stays in one area (localizes).  You have a rash, stiff neck, or severe headache.  You are irritable, sleepy, or difficult to awaken.  You are weak, dizzy, or extremely thirsty. SEEK IMMEDIATE MEDICAL CARE IF:   You are unable to keep fluids down or you get worse despite treatment.  You have frequent episodes of vomiting or diarrhea.  You have blood or green matter (bile) in your vomit.  You have blood in your stool or your stool looks black and tarry.  You have not urinated in 6 to 8 hours, or you have only urinated a small amount of very dark urine.  You have a fever.  You faint. MAKE SURE YOU:   Understand these instructions.  Will watch your condition.  Will get help right away if you are not doing well or get worse. Document Released: 11/12/2005 Document Revised: 02/04/2012 Document Reviewed: 07/02/2011 [employer] Patient Information 2014 [employer].  

## 2013-08-07 NOTE — Progress Notes (Addendum)
OFFICE PROGRESS NOTE  CC  [name], MD [address] [place] [address] invasive mammary carcinoma with lobular features. Diagnosed in October 2007 Dr. Teodora Medici Dr.Daniel Clark-pearson  DIAGNOSIS: 67 year old female with diagnosis of stage II (T1 N1)  PRIOR THERAPY:  #1 patient presented with a mass in her right breast in October 2007. She underwent bilateral mammograms. The right breast ultrasound performed on 08/19/2006 showed increased density and distortion in the subareolar location of the right breast. Ultrasound showed abnormal echoes in the subareolar location which was a change measuring 1.5 cm. Patient underwent an ultrasound-guided biopsy on 08/23/2006 which showed invasive mammary carcinoma with lobular features. ER +100% PR 3% proliferation marker Ki-67 12% HER-2/neu 2+ by fish. MRI of the breasts on 08/30/2006 showed an ill-defined enhancing mass in the right breast measuring 2.6 x 4.0 x 4.1 cm. Enhancement trailed out to the nipple but did not involve the nipple. There was also a small enhancing internal mammary node measuring 7 mm suspicious for metastatic disease. Left breast was negative.  #2 patient was seen by Dr. Beatris Ship on 09/09/2006. She received neoadjuvant chemotherapy initially consisting of FEC regimen given dose dense for 4 cycles followed by Taxotere. She received her chemotherapy she received her chemotherapy from 09/20/2006 through February 2008. She then went on to lumpectomy on 02/07/2007. However there was essentially residual tumor dispersed over an area 1.8 cm with some close surgical margins at the medial area. Her case was reviewed at the breast conference and it was suggested she undergo mastectomy. Patient underwent a mastectomy on 03/19/2007.  #3 she was subsequently seen by radiation oncology for consideration of post mastectomy radiation therapy. However she was not given radiation.  #4 she was then begun on Femara 2.5 mg daily.  She has now completed 5 years of therapy. It was discontinued in June 2014.    CURRENT THERAPY: Observation  INTERVAL HISTORY: Gina [name] 67 y.o. female returns for an urgent visit due to anemia.  She was last seen 2 days ago when we started this new anemia work up.  In reviewing her labs, her hemoglobin has dropped again to 6.7, her platelets are lower.  I reviewed this with her and her husband, and her CT scan results revealing diffuse skeletal lesions.  She is in a wheelchair today and her weakness has worsened.  She is more fatigued as well.  She is in a wheelchair today as compared to being able to walk in on Wednesday, 2 days prior.  She has started taking the Tramadol I prescribed.  She takes one half tablet twice per day, and one whole tablet at night to help her sleep.  It has decreased her pain intensity, and has made her slightly drowsy.  She denies further fevers and chills, or any further concerns.    MEDICAL HISTORY: Past Medical History  Diagnosis Date  . Breast cancer 04/28/2013  . Myalgia and myositis, unspecified   . Anemia     ALLERGIES:  is allergic to sudafed.  MEDICATIONS:  Current Outpatient Prescriptions  Medication Sig Dispense Refill  . calcium-vitamin D (OSCAL WITH D) 500-200 MG-UNIT per tablet Take 2 tablets by mouth daily.  60 tablet  12  . cetirizine (ZYRTEC) 10 MG tablet Take 10 mg by mouth daily.        . Cholecalciferol (VITAMIN D3) 2000 UNITS TABS Take 2,000 Units by mouth daily.  90 tablet  12  . montelukast (SINGULAIR) 10 MG tablet Take 10 mg  by mouth at bedtime.      . traMADol (ULTRAM) 50 MG tablet Take 0.5-1 tablets (25-50 mg total) by mouth every 6 (six) hours as needed for pain.  30 tablet  0  . aspirin 81 MG tablet Take 81 mg by mouth daily.        Marland Kitchen FeFum-FePo-FA-B Cmp-C-Zn-Mn-Cu (SE-TAN PLUS) 162-115.2-1 MG CAPS TAKE ONE CAPSULE BY MOUTH EVERY DAY ON AN EMPTY STOMACH  30 capsule  PRN   No current facility-administered medications for this visit.     SURGICAL HISTORY: No past surgical history on file.  REVIEW OF SYSTEMS:  A 10 point review of systems was conducted and is otherwise negative except for what is noted above.    PHYSICAL EXAMINATION: Blood pressure 101/65, pulse 101, temperature 99.9 F (37.7 C), temperature source Oral, resp. rate 20, height 5\' 3"  (1.6 m), weight 99 lb 8 oz (45.133 kg). Body mass index is 17.63 kg/(m^2). General: Patient is an ill appearing female, in wheelchair HEENT: PERRLA, sclerae anicteric, conjunctival pallor, MMM Neck: supple, no palpable adenopathy Lungs: clear to auscultation bilaterally, no wheezes, rhonchi, or rales Cardiovascular: regular rate rhythm, S1, S2, no murmurs, rubs or gallops Abdomen: Soft, non-tender, non-distended, normoactive bowel sounds, no HSM Extremities: warm and well perfused, no clubbing, cyanosis, or edema Skin:  Pale, No rashes or lesions Neuro: Non-focal Breasts: right mastectomy site no nodularity or sign of recurrence, left breast no masses or nodules ECOG PERFORMANCE STATUS: 0 - Asymptomatic  LABORATORY DATA: Lab Results  Component Value Date   WBC 4.7 08/07/2013   HGB 6.7* 08/07/2013   HCT 21.1* 08/07/2013   MCV 86.1 08/07/2013   PLT 102* 08/07/2013      Chemistry      Component Value Date/Time   NA 132* 08/07/2013 1422   NA 141 07/23/2013 2123   K 4.6 08/07/2013 1422   K 3.9 07/23/2013 2123   CL 106 07/23/2013 2123   CL 105 04/17/2013 1323   CO2 30* 08/07/2013 1422   CO2 27 07/23/2013 2123   BUN 18.5 08/07/2013 1422   BUN 18 07/23/2013 2123   CREATININE 0.8 08/07/2013 1422   CREATININE 0.83 07/23/2013 2123      Component Value Date/Time   CALCIUM 8.9 08/07/2013 1422   CALCIUM 9.7 07/23/2013 2123   ALKPHOS 242* 08/07/2013 1422   ALKPHOS 68 04/17/2012 1314   AST 76* 08/07/2013 1422   AST 21 04/17/2012 1314   ALT 10 08/07/2013 1422   ALT 16 04/17/2012 1314   BILITOT 0.68 08/07/2013 1422   BILITOT 0.5 04/17/2012 1314    REPORT OF SURGICAL PATHOLOGY  Case #:  [id number] Patient Name: Gina [name], [name] Office Chart Number: N/A  MRN: [id number] Pathologist: Havery Moros, MD DOB/Age Dec 23, 1945 (Age: 67) Gender: F Date Taken: 02/07/2007 Date Received: 02/07/2007  FINAL DIAGNOSIS  MICROSCOPIC EXAMINATION AND DIAGNOSIS  1. RIGHT AXILLARY SENTINEL LYMPH NODE, EXCISION: - METASTATIC CARCINOMA CONSISTENT WITH BREAST PRIMARY, SEE COMMENT.  2. RIGHT BREAST, NEEDLE LOCALIZATION BIOPSY: - RESIDUAL CARCINOMA IN TUMOR REGRESSION AREA. - CARCINOMA IS CLOSE TO MEDIAL AND LATERAL SURGICAL MARGINS OF RESECTION. - SEE ONCOLOGY TABLE  COMMENT 1. The sections show metastatic carcinoma on H The tumor is seen in the form of dispersed atypical epithelioid cells in a fibrotic background involving an area more than 0.2 cm consistent with metastatic carcinoma. No extranodal extension is identified. For confirmation, Cytokeratin stain (AE1/AE3) was performed and is positive. The control stained appropriately.  ONCOLOGY TABLE-BREAST, INCISIONAL/EXCISIONAL BIOPSY OR  MASTECTOMY WITH LYMPH NODES  1. Maximum tumor size (cm): Dispersed tumor cells in a tumor regression area measuring 1.8 cm, see comment 2. Margins: Tumor cells are seen less than 0.1 cm from the surgical margins of resection Invasive component, distance to closest margin: Less than 0.1 cm In situ component, distance to closest margin: N/A 3. Vascular/Lymphatic invasion: Yes 4. Histology, invasive component: Invasive mammary carcinoma consistent with lobular carcinoma 5. Grade, invasive component (Elston-Ellis modified Scarff-Bloom-Richardson): II Tubule formation grade: 3 Nuclear pleomorphism grade: 2 Mitotic grade: 1 6. Extensive intraductal component (JCRT): No 7. Histology, in situ component: N/A 8. Grade, in situ component: N/A 9. Multicentric (separate tumors in different quadrants): Unknown 10. Multifocal (separate tumors in same quadrant or biopsy): No 11. Axillary lymph  nodes: #examined: 1 #with metastasis: 1 ITC (isolated tumor cells, < 0.17mm): 0 Micrometastasis (> 0.53mm, < 2mm): 0 Metastasis > 2 mm: 1 Extracapsular extension: No 12. TNM Code: ypT1, pN1a, pMX 13. Breast Prognostic Markers: Case number [id number] Estrogen receptor positive (100%) Progesterone receptor positive (3%) Ki 67 (Mib-1) 12% Her 2 neu (HercepTest) 2+ Her 2 neu by FISH N/A 14. Non-neoplastic breast: Fibrosis 15. Comments: Sections of the localized tissue show a fibrotic area measuring 1.8 cm and displaying dispersed atypical epithelioid cells throughout that area. Some of the cells surround the benign appearing ductal elements. The findings are subtle and most compatible with residual carcinoma. For confirmation cytokeratin stains were performed on several blocks representing the localized tissue and show strong positivity in previously described epithelioid cells confirming the morphologic impression of residual carcinoma. The carcinoma is less than 0.1 cm from the medial surgical margin of resection and approximately 0.1 cm from the lateral surgical margin of resection. Since the findings are subtle and the tumor is essentially composed of dispersed cells throughout, the status of the medial and lateral margins in particular may be questionable and hence truly negative margins cannot be assured. I strongly recommend clinical correlation and followup. (BNS:caf 02/12/07)  RADIOGRAPHIC STUDIES:  No results found.  ASSESSMENT: 67 year old female with  #1 history of stage II invasive mammary carcinoma with lobular features originally diagnosed in 2007. She underwent neoadjuvant chemotherapy consisting of 4 cycles of dose dense FEC followed by dose dense Taxotere for 4 cycles. She then had a lumpectomy that revealed residual disease. She proceeded on to have a mastectomy. She was not a candidate for radiation therapy even though she did have an evaluation by radiation  oncology.  #2 Dr. Donnie Coffin placed her on Femara 2.5 mg daily she overall tolerated it well. She is now finishing up 5 years of Femara. She discontinued this on 04/28/2013.  #3 Anemia   PLAN:   #1 Patient had a CT chest abdomen and pelvis that revealed diffuse bony lytic lesions consistent with widespread metastatic disease.  I discussed this with the patient.  I ordered a PET scan for further evaluation.    #2.  In regards to the anemia, she continues to have a rapidly declining hemoglobin.  It is 6.7 today and was 8.3 two days ago.  It is very worrisome.  She is at the point of needing a blood transfusion.  I recommended one and gave her counseling on the risks and benefits.  She agreed to receive a blood transfusion tomorrow.  I have ordered this as well as I gave her information about the risks and process in her after visit summary.  She and I also discussed that she will need a bone marrow biopsy.  I explained the procedure and the risks/benefits and necessity of this procedure.    #3. Mrs. Farnell will receive IV fluids in the infusion room today for dehydration and decreased oral intake.    #4 Mrs. Caputi will return on Monday, 08/10/13 for labs and evaluation.  She will have a repeat CBC, as well as myeloma labs drawn.    All questions were answered. The patient knows to call the clinic with any problems, questions or concerns. We can certainly see the patient much sooner if necessary.  I spent 40 minutes counseling the patient face to face. The total time spent in the appointment was 60 minutes.   Cherie Ouch Lyn Hollingshead, NP Medical Oncology [facility] Phone: [phone] 08/09/2013, 4:58 PM  ATTENDING'S ATTESTATION:  I personally reviewed patient's chart, examined patient myself, formulated the treatment plan as followed.    I had an extensive discussion with the patient's husband regarding her plan for work up to see why patient has metastatic disease versus a primary  multiple myeloma. We will plan on doing a bone marrow biopsy and aspirate. Patient does need blood transfusion as well. We will make arrangements for this. In the meantime we will continue to control her pain encouraged her to eat more. We will follow her very closely.  Drue Second, MD Medical/Oncology [facility] [phone] (beeper) [phone] (Office)  08/16/2013, 11:14 PM

## 2013-08-07 NOTE — Patient Instructions (Signed)
Blood Products Information This is information about transfusions of blood products. All blood that is to be transfused is tested for blood type, compatibility with the recipient, and for infections. Except in emergencies, giving a transfusion requires a written consent. Blood transfusions are often given as packed red blood cells. This means the other parts of the blood have been taken out. Blood may be needed to treat severe anemia or bleeding. Other blood products include plasma, platelets, immune globulin, and cryoprecipitate. Blood for transfusion is mostly donated by volunteers. The blood donors are carefully screened for risk factors that could cause disease. Donors are all tested for infections that could be transmitted by blood. The blood product supply today is the safest it has ever been. Some risks do remain.  A minor reaction with fever, chills, or rash happens in about 1% of blood product transfusions.  Life-threatening reactions occur in less than 1 in a million transfusions.  Infection with germs (bacteria), viruses or parasites like malaria can still happen. The risk is very low.  Hepatitis B occurs in about 1 case in 150,000 transfusions.  Hepatitis C is seen once in 500,000.  HIV is transmitted less than once every million transfusions. When you receive a transfusion of packed red blood cells, your blood is tested for blood group and Rh type. Your blood is also screened for antibodies that could cause a serious reaction. A cross-match test is done to make sure the blood is safe to give.  Talk with your caregiver if you have any concerns about receiving a transfusion of blood products. Make sure your questions are answered. Transfusions are not given if your caregiver feels the risk is greater than the need. Document Released: 11/12/2005 Document Revised: 02/04/2012 Document Reviewed: 05/02/2007 [facility] Patient Information 2014 [place], [place]. Blood Transfusion  Information WHAT IS A BLOOD TRANSFUSION? A transfusion is the replacement of blood or some of its parts. Blood is made up of multiple cells which provide different functions.  Red blood cells carry oxygen and are used for blood loss replacement.  White blood cells fight against infection.  Platelets control bleeding.  Plasma helps clot blood.  Other blood products are available for specialized needs, such as hemophilia or other clotting disorders. BEFORE THE TRANSFUSION  Who gives blood for transfusions?   You may be able to donate blood to be used at a later date on yourself (autologous donation).  Relatives can be asked to donate blood. This is generally not any safer than if you have received blood from a stranger. The same precautions are taken to ensure safety when a relative's blood is donated.  Healthy volunteers who are fully evaluated to make sure their blood is safe. This is blood bank blood. Transfusion therapy is the safest it has ever been in the practice of medicine. Before blood is taken from a donor, a complete history is taken to make sure that person has no history of diseases nor engages in risky social behavior (examples are intravenous drug use or sexual activity with multiple partners). The donor's travel history is screened to minimize risk of transmitting infections, such as malaria. The donated blood is tested for signs of infectious diseases, such as HIV and hepatitis. The blood is then tested to be sure it is compatible with you in order to minimize the chance of a transfusion reaction. If you or a relative donates blood, this is often done in anticipation of surgery and is not appropriate for emergency situations. It takes many  days to process the donated blood. RISKS AND COMPLICATIONS Although transfusion therapy is very safe and saves many lives, the main dangers of transfusion include:   Getting an infectious disease.  Developing a transfusion reaction. This  is an allergic reaction to something in the blood you were given. Every precaution is taken to prevent this. The decision to have a blood transfusion has been considered carefully by your caregiver before blood is given. Blood is not given unless the benefits outweigh the risks. AFTER THE TRANSFUSION  Right after receiving a blood transfusion, you will usually feel much better and more energetic. This is especially true if your red blood cells have gotten low (anemic). The transfusion raises the level of the red blood cells which carry oxygen, and this usually causes an energy increase.  The nurse administering the transfusion will monitor you carefully for complications. HOME CARE INSTRUCTIONS  No special instructions are needed after a transfusion. You may find your energy is better. Speak with your caregiver about any limitations on activity for underlying diseases you may have. SEEK MEDICAL CARE IF:   Your condition is not improving after your transfusion.  You develop redness or irritation at the intravenous (IV) site. SEEK IMMEDIATE MEDICAL CARE IF:  Any of the following symptoms occur over the next 12 hours:  Shaking chills.  You have a temperature by mouth above 102 F (38.9 C), not controlled by medicine.  Chest, back, or muscle pain.  People around you feel you are not acting correctly or are confused.  Shortness of breath or difficulty breathing.  Dizziness and fainting.  You get a rash or develop hives.  You have a decrease in urine output.  Your urine turns a dark color or changes to pink, red, or brown. Any of the following symptoms occur over the next 10 days:  You have a temperature by mouth above 102 F (38.9 C), not controlled by medicine.  Shortness of breath.  Weakness after normal activity.  The white part of the eye turns yellow (jaundice).  You have a decrease in the amount of urine or are urinating less often.  Your urine turns a dark color or  changes to pink, red, or brown. Document Released: 11/09/2000 Document Revised: 02/04/2012 Document Reviewed: 06/28/2008 [facility] Patient Information 2014 [place], [place].

## 2013-08-08 ENCOUNTER — Ambulatory Visit (HOSPITAL_BASED_OUTPATIENT_CLINIC_OR_DEPARTMENT_OTHER): Payer: 59

## 2013-08-08 VITALS — BP 103/36 | HR 79 | Temp 98.0°F | Resp 18

## 2013-08-08 DIAGNOSIS — C50919 Malignant neoplasm of unspecified site of unspecified female breast: Secondary | ICD-10-CM

## 2013-08-08 DIAGNOSIS — D649 Anemia, unspecified: Secondary | ICD-10-CM

## 2013-08-08 MED ORDER — ACETAMINOPHEN 325 MG PO TABS
ORAL_TABLET | ORAL | Status: AC
  Filled 2013-08-08: qty 2

## 2013-08-08 MED ORDER — DIPHENHYDRAMINE HCL 25 MG PO CAPS
25.0000 mg | ORAL_CAPSULE | Freq: Once | ORAL | Status: AC
  Administered 2013-08-08: 25 mg via ORAL

## 2013-08-08 MED ORDER — SODIUM CHLORIDE 0.9 % IV SOLN
250.0000 mL | Freq: Once | INTRAVENOUS | Status: AC
  Administered 2013-08-08: 250 mL via INTRAVENOUS

## 2013-08-08 MED ORDER — ACETAMINOPHEN 325 MG PO TABS
650.0000 mg | ORAL_TABLET | Freq: Once | ORAL | Status: AC
  Administered 2013-08-08: 650 mg via ORAL

## 2013-08-08 MED ORDER — DIPHENHYDRAMINE HCL 25 MG PO CAPS
ORAL_CAPSULE | ORAL | Status: AC
  Filled 2013-08-08: qty 1

## 2013-08-08 NOTE — Patient Instructions (Addendum)
Blood Transfusion  A blood transfusion replaces your blood or some of its parts. Blood is replaced when you have lost blood because of surgery, an accident, or for severe blood conditions like anemia. You can donate blood to be used on yourself if you have a planned surgery. If you lose blood during that surgery, your own blood can be given back to you. Any blood given to you is checked to make sure it matches your blood type. Your temperature, blood pressure, and heart rate (vital signs) will be checked often.  GET HELP RIGHT AWAY IF:   You feel sick to your stomach (nauseous) or throw up (vomit).  You have watery poop (diarrhea).  You have shortness of breath or trouble breathing.  You have blood in your pee (urine) or have dark colored pee.  You have chest pain or tightness.  Your eyes or skin turn yellow (jaundice).  You have a temperature by mouth above 102 F (38.9 C), not controlled by medicine.  You start to shake and have chills.  You develop a a red rash (hives) or feel itchy.  You develop lightheadedness or feel confused.  You develop back, joint, or muscle pain.  You do not feel hungry (lost appetite).  You feel tired, restless, or nervous.  You develop belly (abdominal) cramps. Document Released: 02/08/2009 Document Revised: 02/04/2012 Document Reviewed: 02/08/2009 [employer] Patient Information 2014 [employer].  

## 2013-08-08 NOTE — Progress Notes (Signed)
Pt here for 2 unit of blood. Pt verbalized desire for blood products and  has signed consent . Copy faxed to Lab and sent to medical records.

## 2013-08-10 ENCOUNTER — Other Ambulatory Visit: Payer: Self-pay | Admitting: Emergency Medicine

## 2013-08-10 ENCOUNTER — Ambulatory Visit: Payer: 59 | Admitting: Lab

## 2013-08-10 ENCOUNTER — Other Ambulatory Visit (HOSPITAL_BASED_OUTPATIENT_CLINIC_OR_DEPARTMENT_OTHER): Payer: 59 | Admitting: Lab

## 2013-08-10 ENCOUNTER — Telehealth: Payer: Self-pay | Admitting: Oncology

## 2013-08-10 ENCOUNTER — Ambulatory Visit (HOSPITAL_BASED_OUTPATIENT_CLINIC_OR_DEPARTMENT_OTHER): Payer: 59 | Admitting: Oncology

## 2013-08-10 ENCOUNTER — Ambulatory Visit (HOSPITAL_BASED_OUTPATIENT_CLINIC_OR_DEPARTMENT_OTHER): Payer: 59

## 2013-08-10 VITALS — BP 97/61 | HR 92 | Temp 98.3°F | Resp 18 | Ht 63.0 in | Wt 100.0 lb

## 2013-08-10 VITALS — BP 106/60 | HR 75 | Temp 97.6°F | Resp 20

## 2013-08-10 DIAGNOSIS — D630 Anemia in neoplastic disease: Secondary | ICD-10-CM

## 2013-08-10 DIAGNOSIS — C50919 Malignant neoplasm of unspecified site of unspecified female breast: Secondary | ICD-10-CM

## 2013-08-10 LAB — COMPREHENSIVE METABOLIC PANEL (CC13)
AST: 25 U/L (ref 5–34)
Albumin: 2.5 g/dL — ABNORMAL LOW (ref 3.5–5.0)
BUN: 15.4 mg/dL (ref 7.0–26.0)
Calcium: 9.4 mg/dL (ref 8.4–10.4)
Chloride: 97 mEq/L — ABNORMAL LOW (ref 98–109)
Potassium: 4.3 mEq/L (ref 3.5–5.1)
Sodium: 136 mEq/L (ref 136–145)
Total Protein: 6.8 g/dL (ref 6.4–8.3)

## 2013-08-10 LAB — CBC WITH DIFFERENTIAL/PLATELET
BASO%: 1.3 % (ref 0.0–2.0)
Basophils Absolute: 0.1 10*3/uL (ref 0.0–0.1)
HCT: 26.9 % — ABNORMAL LOW (ref 34.8–46.6)
HGB: 8.8 g/dL — ABNORMAL LOW (ref 11.6–15.9)
MCHC: 32.7 g/dL (ref 31.5–36.0)
MONO#: 0.5 10*3/uL (ref 0.1–0.9)
NEUT%: 58.5 % (ref 38.4–76.8)
RDW: 16.4 % — ABNORMAL HIGH (ref 11.2–14.5)
WBC: 4 10*3/uL (ref 3.9–10.3)
lymph#: 1.1 10*3/uL (ref 0.9–3.3)

## 2013-08-10 LAB — TECHNOLOGIST REVIEW

## 2013-08-10 MED ORDER — SODIUM CHLORIDE 0.9 % IV SOLN
250.0000 mL | Freq: Once | INTRAVENOUS | Status: AC
  Administered 2013-08-10: 250 mL via INTRAVENOUS

## 2013-08-10 MED ORDER — ACETAMINOPHEN 325 MG PO TABS
ORAL_TABLET | ORAL | Status: AC
  Filled 2013-08-10: qty 2

## 2013-08-10 MED ORDER — DIPHENHYDRAMINE HCL 25 MG PO CAPS
ORAL_CAPSULE | ORAL | Status: AC
  Filled 2013-08-10: qty 1

## 2013-08-10 MED ORDER — ACETAMINOPHEN 325 MG PO TABS
650.0000 mg | ORAL_TABLET | Freq: Once | ORAL | Status: AC
  Administered 2013-08-10: 650 mg via ORAL

## 2013-08-10 MED ORDER — DIPHENHYDRAMINE HCL 25 MG PO CAPS
25.0000 mg | ORAL_CAPSULE | Freq: Once | ORAL | Status: AC
  Administered 2013-08-10: 25 mg via ORAL

## 2013-08-10 NOTE — Patient Instructions (Addendum)
Proceed with 2 units of PRBC today  We will see you back as scheduled on Thursday

## 2013-08-10 NOTE — Patient Instructions (Addendum)
Blood Transfusion Information WHAT IS A BLOOD TRANSFUSION? A transfusion is the replacement of blood or some of its parts. Blood is made up of multiple cells which provide different functions.  Red blood cells carry oxygen and are used for blood loss replacement.  White blood cells fight against infection.  Platelets control bleeding.  Plasma helps clot blood.  Other blood products are available for specialized needs, such as hemophilia or other clotting disorders. BEFORE THE TRANSFUSION  Who gives blood for transfusions?   You may be able to donate blood to be used at a later date on yourself (autologous donation).  Relatives can be asked to donate blood. This is generally not any safer than if you have received blood from a stranger. The same precautions are taken to ensure safety when a relative's blood is donated.  Healthy volunteers who are fully evaluated to make sure their blood is safe. This is blood bank blood. Transfusion therapy is the safest it has ever been in the practice of medicine. Before blood is taken from a donor, a complete history is taken to make sure that person has no history of diseases nor engages in risky social behavior (examples are intravenous drug use or sexual activity with multiple partners). The donor's travel history is screened to minimize risk of transmitting infections, such as malaria. The donated blood is tested for signs of infectious diseases, such as HIV and hepatitis. The blood is then tested to be sure it is compatible with you in order to minimize the chance of a transfusion reaction. If you or a relative donates blood, this is often done in anticipation of surgery and is not appropriate for emergency situations. It takes many days to process the donated blood. RISKS AND COMPLICATIONS Although transfusion therapy is very safe and saves many lives, the main dangers of transfusion include:   Getting an infectious disease.  Developing a  transfusion reaction. This is an allergic reaction to something in the blood you were given. Every precaution is taken to prevent this. The decision to have a blood transfusion has been considered carefully by your caregiver before blood is given. Blood is not given unless the benefits outweigh the risks. AFTER THE TRANSFUSION  Right after receiving a blood transfusion, you will usually feel much better and more energetic. This is especially true if your red blood cells have gotten low (anemic). The transfusion raises the level of the red blood cells which carry oxygen, and this usually causes an energy increase.  The nurse administering the transfusion will monitor you carefully for complications. HOME CARE INSTRUCTIONS  No special instructions are needed after a transfusion. You may find your energy is better. Speak with your caregiver about any limitations on activity for underlying diseases you may have. SEEK MEDICAL CARE IF:   Your condition is not improving after your transfusion.  You develop redness or irritation at the intravenous (IV) site. SEEK IMMEDIATE MEDICAL CARE IF:  Any of the following symptoms occur over the next 12 hours:  Shaking chills.  You have a temperature by mouth above 102 F (38.9 C), not controlled by medicine.  Chest, back, or muscle pain.  People around you feel you are not acting correctly or are confused.  Shortness of breath or difficulty breathing.  Dizziness and fainting.  You get a rash or develop hives.  You have a decrease in urine output.  Your urine turns a dark color or changes to pink, red, or brown. Any of the following   symptoms occur over the next 10 days:  You have a temperature by mouth above 102 F (38.9 C), not controlled by medicine.  Shortness of breath.  Weakness after normal activity.  The white part of the eye turns yellow (jaundice).  You have a decrease in the amount of urine or are urinating less often.  Your  urine turns a dark color or changes to pink, red, or brown. Document Released: 11/09/2000 Document Revised: 02/04/2012 Document Reviewed: 06/28/2008 ExitCare Patient Information 2014 [employer].  

## 2013-08-11 ENCOUNTER — Ambulatory Visit: Payer: 59 | Admitting: Adult Health

## 2013-08-11 ENCOUNTER — Other Ambulatory Visit: Payer: Self-pay | Admitting: Adult Health

## 2013-08-11 LAB — TYPE AND SCREEN
ABO/RH(D): O POS
Unit division: 0
Unit division: 0

## 2013-08-12 ENCOUNTER — Other Ambulatory Visit: Payer: Self-pay | Admitting: Emergency Medicine

## 2013-08-12 DIAGNOSIS — C50919 Malignant neoplasm of unspecified site of unspecified female breast: Secondary | ICD-10-CM

## 2013-08-12 LAB — SPEP & IFE WITH QIG
Albumin ELP: 44.5 % — ABNORMAL LOW (ref 55.8–66.1)
Alpha-1-Globulin: 15.8 % — ABNORMAL HIGH (ref 2.9–4.9)
Alpha-2-Globulin: 18.7 % — ABNORMAL HIGH (ref 7.1–11.8)
Beta 2: 5 % (ref 3.2–6.5)
Beta Globulin: 6.6 % (ref 4.7–7.2)
IgA: 98 mg/dL (ref 69–380)

## 2013-08-13 ENCOUNTER — Encounter: Payer: Self-pay | Admitting: Adult Health

## 2013-08-13 ENCOUNTER — Other Ambulatory Visit (HOSPITAL_BASED_OUTPATIENT_CLINIC_OR_DEPARTMENT_OTHER): Payer: 59 | Admitting: Lab

## 2013-08-13 ENCOUNTER — Ambulatory Visit (HOSPITAL_BASED_OUTPATIENT_CLINIC_OR_DEPARTMENT_OTHER): Payer: 59 | Admitting: Adult Health

## 2013-08-13 ENCOUNTER — Ambulatory Visit: Payer: 59 | Admitting: Lab

## 2013-08-13 ENCOUNTER — Telehealth: Payer: Self-pay | Admitting: *Deleted

## 2013-08-13 VITALS — BP 115/72 | HR 86 | Temp 98.2°F | Resp 20 | Ht 63.0 in | Wt 99.1 lb

## 2013-08-13 DIAGNOSIS — C50919 Malignant neoplasm of unspecified site of unspecified female breast: Secondary | ICD-10-CM

## 2013-08-13 DIAGNOSIS — D649 Anemia, unspecified: Secondary | ICD-10-CM

## 2013-08-13 DIAGNOSIS — C50911 Malignant neoplasm of unspecified site of right female breast: Secondary | ICD-10-CM

## 2013-08-13 DIAGNOSIS — R52 Pain, unspecified: Secondary | ICD-10-CM

## 2013-08-13 DIAGNOSIS — F329 Major depressive disorder, single episode, unspecified: Secondary | ICD-10-CM

## 2013-08-13 DIAGNOSIS — F3289 Other specified depressive episodes: Secondary | ICD-10-CM

## 2013-08-13 LAB — CBC WITH DIFFERENTIAL/PLATELET
Basophils Absolute: 0.1 10*3/uL (ref 0.0–0.1)
Eosinophils Absolute: 0.1 10*3/uL (ref 0.0–0.5)
HCT: 39.7 % (ref 34.8–46.6)
HGB: 13.1 g/dL (ref 11.6–15.9)
LYMPH%: 28.1 % (ref 14.0–49.7)
MONO#: 0.6 10*3/uL (ref 0.1–0.9)
NEUT#: 2.4 10*3/uL (ref 1.5–6.5)
NEUT%: 54.7 % (ref 38.4–76.8)
Platelets: 118 10*3/uL — ABNORMAL LOW (ref 145–400)
WBC: 4.4 10*3/uL (ref 3.9–10.3)
lymph#: 1.2 10*3/uL (ref 0.9–3.3)

## 2013-08-13 LAB — COMPREHENSIVE METABOLIC PANEL (CC13)
Alkaline Phosphatase: 210 U/L — ABNORMAL HIGH (ref 40–150)
Glucose: 122 mg/dl (ref 70–140)
Total Bilirubin: 0.32 mg/dL (ref 0.20–1.20)
Total Protein: 7 g/dL (ref 6.4–8.3)

## 2013-08-13 LAB — TECHNOLOGIST REVIEW: Technologist Review: 7

## 2013-08-13 MED ORDER — MIRTAZAPINE 7.5 MG PO TABS: 7.5000 mg | ORAL_TABLET | Freq: Every day | ORAL | Status: DC

## 2013-08-13 MED ORDER — TRAMADOL HCL 50 MG PO TABS: 25.0000 mg | ORAL_TABLET | Freq: Four times a day (QID) | ORAL | Status: DC | PRN

## 2013-08-13 NOTE — Telephone Encounter (Signed)
appts made and printed...td 

## 2013-08-13 NOTE — Progress Notes (Addendum)
OFFICE PROGRESS NOTE  CC  [name], MD [address] [place] [address] invasive mammary carcinoma with lobular features. Diagnosed in October 2007 Dr. Teodora Medici Dr.Daniel Clark-pearson  DIAGNOSIS: 67 year old female with diagnosis of stage II (T1 N1)  PRIOR THERAPY:  #1 patient presented with a mass in her right breast in October 2007. She underwent bilateral mammograms. The right breast ultrasound performed on 08/19/2006 showed increased density and distortion in the subareolar location of the right breast. Ultrasound showed abnormal echoes in the subareolar location which was a change measuring 1.5 cm. Patient underwent an ultrasound-guided biopsy on 08/23/2006 which showed invasive mammary carcinoma with lobular features. ER +100% PR 3% proliferation marker Ki-67 12% HER-2/neu 2+ by fish. MRI of the breasts on 08/30/2006 showed an ill-defined enhancing mass in the right breast measuring 2.6 x 4.0 x 4.1 cm. Enhancement trailed out to the nipple but did not involve the nipple. There was also a small enhancing internal mammary node measuring 7 mm suspicious for metastatic disease. Left breast was negative.  #2 patient was seen by Dr. Beatris Ship on 09/09/2006. She received neoadjuvant chemotherapy initially consisting of FEC regimen given dose dense for 4 cycles followed by Taxotere. She received her chemotherapy she received her chemotherapy from 09/20/2006 through February 2008. She then went on to lumpectomy on 02/07/2007. However there was essentially residual tumor dispersed over an area 1.8 cm with some close surgical margins at the medial area. Her case was reviewed at the breast conference and it was suggested she undergo mastectomy. Patient underwent a mastectomy on 03/19/2007.  #3 she was subsequently seen by radiation oncology for consideration of post mastectomy radiation therapy. However, she was not given radiation.  #4 she was then begun on Femara 2.5 mg daily.  She has now completed 5 years of therapy. It was discontinued in June 2014.    #5 Returned to clinic on 08/05/13 for evaluation and management of new anemia.  She has underwent CT chest abdomen and pelvis which reveals diffuse lytic lesions throughout the axial and appendicular skeleton.  Work up is ongoing  CURRENT THERAPY: anemia work up  INTERVAL HISTORY: Gina [name] 67 y.o. female returns for evaluation of her anemia.  A work up continues to be underway.  She received 2 units of PRBCs on 08/08/13 and again on 08/10/13.  Her hemoglobin has improved to 13.1.  She still feels fatigued and has been attempting to drink boost four times per day but is having difficulty with it.  She has pain, that continues to be generalized and relieved with Tramadol, 0.5 tab during the day if needed and 1 tab at night.  Her family is with her today and are requesting an anti-depressant.  Otherwise, she denies fevers, chills, nausea, vomiting, constipation, night sweats, or further concerns.    MEDICAL HISTORY: Past Medical History  Diagnosis Date  . Breast cancer 04/28/2013  . Myalgia and myositis, unspecified   . Anemia     ALLERGIES:  is allergic to sudafed.  MEDICATIONS:  Current Outpatient Prescriptions  Medication Sig Dispense Refill  . cetirizine (ZYRTEC) 10 MG tablet Take 10 mg by mouth daily.        . mirtazapine (REMERON) 7.5 MG tablet Take 1 tablet (7.5 mg total) by mouth at bedtime.  30 tablet  0  . omeprazole (PRILOSEC) 40 MG capsule Take 40 mg by mouth daily.      . traMADol (ULTRAM) 50 MG tablet Take 0.5-1 tablets (25-50 mg total)  by mouth every 6 (six) hours as needed for pain.  120 tablet  0   No current facility-administered medications for this visit.    SURGICAL HISTORY: No past surgical history on file.  REVIEW OF SYSTEMS:  A 10 point review of systems was conducted and is otherwise negative except for what is noted above.    PHYSICAL EXAMINATION: Blood pressure 115/72, pulse 86,  temperature 98.2 F (36.8 C), temperature source Oral, resp. rate 20, height 5\' 3"  (1.6 m), weight 99 lb 1.6 oz (44.951 kg). Body mass index is 17.56 kg/(m^2). General: Patient is an ill appearing female, in wheelchair, improved from last exam HEENT: PERRLA, sclerae anicteric, conjunctival pallor, MMM Neck: supple, no palpable adenopathy Lungs: clear to auscultation bilaterally, no wheezes, rhonchi, or rales Cardiovascular: regular rate rhythm, S1, S2, no murmurs, rubs or gallops Abdomen: Soft, non-tender, non-distended, normoactive bowel sounds, no HSM Extremities: warm and well perfused, no clubbing, cyanosis, or edema Skin:  Pale, No rashes or lesions Neuro: Non-focal Breasts: right mastectomy site no nodularity or sign of recurrence, left breast no masses or nodules ECOG PERFORMANCE STATUS: 0 - Asymptomatic  LABORATORY DATA: Lab Results  Component Value Date   WBC 4.4 08/13/2013   HGB 13.1 08/13/2013   HCT 39.7 08/13/2013   MCV 88.0 08/13/2013   PLT 118* 08/13/2013      Chemistry      Component Value Date/Time   NA 139 08/13/2013 1039   NA 141 07/23/2013 2123   K 4.6 08/13/2013 1039   K 3.9 07/23/2013 2123   CL 106 07/23/2013 2123   CL 105 04/17/2013 1323   CO2 29 08/13/2013 1039   CO2 27 07/23/2013 2123   BUN 21.1 08/13/2013 1039   BUN 18 07/23/2013 2123   CREATININE 0.7 08/13/2013 1039   CREATININE 0.83 07/23/2013 2123      Component Value Date/Time   CALCIUM 9.7 08/13/2013 1039   CALCIUM 9.7 07/23/2013 2123   ALKPHOS 210* 08/13/2013 1039   ALKPHOS 68 04/17/2012 1314   AST 33 08/13/2013 1039   AST 21 04/17/2012 1314   ALT 16 08/13/2013 1039   ALT 16 04/17/2012 1314   BILITOT 0.32 08/13/2013 1039   BILITOT 0.5 04/17/2012 1314    REPORT OF SURGICAL PATHOLOGY  Case #: [id number] Patient Name: AMERO, Jeree P. Office Chart Number: N/A  MRN: [id number] Pathologist: Havery Moros, MD DOB/Age November 09, 1946 (Age: 65) Gender: F Date Taken: 02/07/2007 Date Received: 02/07/2007  FINAL  DIAGNOSIS  MICROSCOPIC EXAMINATION AND DIAGNOSIS  1. RIGHT AXILLARY SENTINEL LYMPH NODE, EXCISION: - METASTATIC CARCINOMA CONSISTENT WITH BREAST PRIMARY, SEE COMMENT.  2. RIGHT BREAST, NEEDLE LOCALIZATION BIOPSY: - RESIDUAL CARCINOMA IN TUMOR REGRESSION AREA. - CARCINOMA IS CLOSE TO MEDIAL AND LATERAL SURGICAL MARGINS OF RESECTION. - SEE ONCOLOGY TABLE  COMMENT 1. The sections show metastatic carcinoma on H The tumor is seen in the form of dispersed atypical epithelioid cells in a fibrotic background involving an area more than 0.2 cm consistent with metastatic carcinoma. No extranodal extension is identified. For confirmation, Cytokeratin stain (AE1/AE3) was performed and is positive. The control stained appropriately.  ONCOLOGY TABLE-BREAST, INCISIONAL/EXCISIONAL BIOPSY OR MASTECTOMY WITH LYMPH NODES  1. Maximum tumor size (cm): Dispersed tumor cells in a tumor regression area measuring 1.8 cm, see comment 2. Margins: Tumor cells are seen less than 0.1 cm from the surgical margins of resection Invasive component, distance to closest margin: Less than 0.1 cm In situ component, distance to closest margin: N/A 3. Vascular/Lymphatic invasion:  Yes 4. Histology, invasive component: Invasive mammary carcinoma consistent with lobular carcinoma 5. Grade, invasive component (Elston-Ellis modified Scarff-Bloom-Richardson): II Tubule formation grade: 3 Nuclear pleomorphism grade: 2 Mitotic grade: 1 6. Extensive intraductal component (JCRT): No 7. Histology, in situ component: N/A 8. Grade, in situ component: N/A 9. Multicentric (separate tumors in different quadrants): Unknown 10. Multifocal (separate tumors in same quadrant or biopsy): No 11. Axillary lymph nodes: #examined: 1 #with metastasis: 1 ITC (isolated tumor cells, < 0.73mm): 0 Micrometastasis (> 0.15mm, < 2mm): 0 Metastasis > 2 mm: 1 Extracapsular extension: No 12. TNM Code: ypT1, pN1a, pMX 13. Breast Prognostic  Markers: Case number [id number] Estrogen receptor positive (100%) Progesterone receptor positive (3%) Ki 67 (Mib-1) 12% Her 2 neu (HercepTest) 2+ Her 2 neu by FISH N/A 14. Non-neoplastic breast: Fibrosis 15. Comments: Sections of the localized tissue show a fibrotic area measuring 1.8 cm and displaying dispersed atypical epithelioid cells throughout that area. Some of the cells surround the benign appearing ductal elements. The findings are subtle and most compatible with residual carcinoma. For confirmation cytokeratin stains were performed on several blocks representing the localized tissue and show strong positivity in previously described epithelioid cells confirming the morphologic impression of residual carcinoma. The carcinoma is less than 0.1 cm from the medial surgical margin of resection and approximately 0.1 cm from the lateral surgical margin of resection. Since the findings are subtle and the tumor is essentially composed of dispersed cells throughout, the status of the medial and lateral margins in particular may be questionable and hence truly negative margins cannot be assured. I strongly recommend clinical correlation and followup. (BNS:caf 02/12/07)  RADIOGRAPHIC STUDIES:  No results found.  ASSESSMENT: 67 year old female with  #1 history of stage II invasive mammary carcinoma with lobular features originally diagnosed in 2007. She underwent neoadjuvant chemotherapy consisting of 4 cycles of dose dense FEC followed by dose dense Taxotere for 4 cycles. She then had a lumpectomy that revealed residual disease. She proceeded on to have a mastectomy. She was not a candidate for radiation therapy even though she did have an evaluation by radiation oncology.  #2 Dr. Donnie Coffin placed her on Femara 2.5 mg daily she overall tolerated it well. She is now finishing up 5 years of Femara. She discontinued this on 04/28/2013.  #3 Anemia--undergoing evaluation.    #4 Severe  malnutrition   PLAN:   #1 Patient is doing well today.  Her hemoglobin has improved.  An SPEP has returned revealing no m spike.  She has a bone marrow biopsy scheduled on 9/25 and PET on 9/26.   I refilled her Tramadol for her bone pain.    #2.   I prescribed Mrs. Stare Mirtazapine 7.5 mg qhs to help with anxiety and also in hopes to help her appetite.  I gave her detailed information about the medication in her after visit summary.    #3  I referred the patient to Vernell Leep, in nutrition for evaluation.  I spent a great deal of time discussing nutrition with the patient and her family today.    #4 Mrs. Wallner will return on Friday, 9/26 for labs an appointment and evaluation.    All questions were answered. The patient knows to call the clinic with any problems, questions or concerns. We can certainly see the patient much sooner if necessary.  I spent 25 minutes counseling the patient face to face. The total time spent in the appointment was 30 minutes.   Cherie Ouch Lyn Hollingshead, NP Medical  Oncology [facility] Phone: [phone] 08/14/2013, 8:28 AM    ATTENDING'S ATTESTATION:  I personally reviewed patient's chart, examined patient myself, formulated the treatment plan as followed.    After another 2 units of packed red cells patient's blood hemoglobin is much improved she overall looks much better. She is trying to eat and drink as much as she possibly can. She is not in any pain. She is scheduled to have her bone marrow biopsy and aspirate performed next week.  Drue Second, MD Medical/Oncology [facility] [phone] (beeper) [phone] (Office)  08/16/2013, 11:28 PM

## 2013-08-13 NOTE — Patient Instructions (Signed)
Mirtazapine tablets What is this medicine? MIRTAZAPINE (mir TAZ a peen) is used to treat depression. This medicine may be used for other purposes; ask your health care provider or pharmacist if you have questions. What should I tell my health care provider before I take this medicine? They need to know if you have any of these conditions: -bipolar disorder -kidney or liver disease -suicidal thoughts -an unusual or allergic reaction to mirtazapine, other medicines, foods, dyes, or preservatives -pregnant or trying to get pregnant -breast-feeding How should I use this medicine? Take this medicine by mouth with a glass of water. Follow the directions on the prescription label. Take your medicine at regular intervals. Do not take your medicine more often than directed. Do not stop taking except on your doctor's advice. A special MedGuide will be given to you by the pharmacist with each prescription and refill. Be sure to read this information carefully each time. Talk to your pediatrician regarding the use of this medicine in children. Special care may be needed. Overdosage: If you think you have taken too much of this medicine contact a poison control center or emergency room at once. NOTE: This medicine is only for you. Do not share this medicine with others. What if I miss a dose? If you miss a dose, take it as soon as you can. If it is almost time for your next dose, take only that dose. Do not take double or extra doses. What may interact with this medicine? Do not take this medicine with any of the following medications: -linezolid -MAOIs like Carbex, Eldepryl, Marplan, Nardil, and Parnate -methylene blue -procarbazine -St. John's wort -tryptophan This medicine may also interact with the following medications: -medicines for depression, anxiety, or psychotic disturbances This list may not describe all possible interactions. Give your health care provider a list of all the medicines,  herbs, non-prescription drugs, or dietary supplements you use. Also tell them if you smoke, drink alcohol, or use illegal drugs. Some items may interact with your medicine. What should I watch for while using this medicine? Visit your doctor or health care professional for regular checks on your progress. Continue to take your medicine even if you do not feel better right away. You may have to take this medicine for several weeks before you feel better. Patients and their families should watch out for depression or thoughts of suicide that get worse. Also watch out for sudden or severe changes in feelings such as feeling anxious, agitated, panicky, irritable, hostile, aggressive, impulsive, severely restless, overly excited and hyperactive, or not being able to sleep. If this happens, especially at the beginning of treatment or after a change in dose, call your health care professional. Bonita Quin may get drowsy or dizzy. Do not drive, use machinery, or do anything that needs mental alertness until you know how this drug affects you. Do not stand or sit up quickly, especially if you are an older patient. This reduces the risk of dizzy or fainting spells. Alcohol can make you more drowsy and dizzy. Avoid alcoholic drinks. Do not treat yourself for coughs, colds, or allergies without asking your doctor or health care professional for advice. Some ingredients can increase possible side effects. Your mouth may get dry. Chewing sugarless gum or sucking hard candy, and drinking plenty of water may help. Contact your doctor if the problem does not go away or is severe. What side effects may I notice from receiving this medicine? Side effects that you should report to your  doctor or health care professional as soon as possible: -allergic reactions like skin rash, itching or hives, swelling of the face, lips, or tongue -breathing problems -confusion -fever, sore throat, or mouth ulcers or blisters -flu like symptoms  including fever, chills, cough, muscle or joint aches and pains -stomach pain with nausea and/or vomiting -suicidal thoughts or other mood changes -swelling of the hands or feet -unusual bleeding or bruising -unusually weak or tired -vomiting Side effects that usually do not require medical attention (report to your doctor or health care professional if they continue or are bothersome): -constipation -increased appetite -weight gain This list may not describe all possible side effects. Call your doctor for medical advice about side effects. You may report side effects to FDA at 1-800-FDA-1088. Where should I keep my medicine? Keep out of the reach of children. Store at room temperature between 15 and 30 degrees C (59 and 86 degrees F) Protect from light and moisture. Throw away any unused medicine after the expiration date. NOTE: This sheet is a summary. It may not cover all possible information. If you have questions about this medicine, talk to your doctor, pharmacist, or health care provider.  2012, [employer]/Gold Standard. (06/22/2010 6:13:46 PM)

## 2013-08-16 NOTE — Progress Notes (Signed)
OFFICE PROGRESS NOTE  CC  Gina Hoff, MD [address] [place] [address] invasive mammary carcinoma with lobular features. Diagnosed in October 2007 Dr. Teodora Medici Dr.Daniel Clark-pearson  DIAGNOSIS: 67 year old female with diagnosis of stage II (T1 N1)  PRIOR THERAPY:  #1 patient presented with a mass in her right breast in October 2007. She underwent bilateral mammograms. The right breast ultrasound performed on 08/19/2006 showed increased density and distortion in the subareolar location of the right breast. Ultrasound showed abnormal echoes in the subareolar location which was a change measuring 1.5 cm. Patient underwent an ultrasound-guided biopsy on 08/23/2006 which showed invasive mammary carcinoma with lobular features. ER +100% PR 3% proliferation marker Ki-67 12% HER-2/neu 2+ by fish. MRI of the breasts on 08/30/2006 showed an ill-defined enhancing mass in the right breast measuring 2.6 x 4.0 x 4.1 cm. Enhancement trailed out to the nipple but did not involve the nipple. There was also a small enhancing internal mammary node measuring 7 mm suspicious for metastatic disease. Left breast was negative.  #2 patient was seen by Dr. Beatris Ship on 09/09/2006. She received neoadjuvant chemotherapy initially consisting of FEC regimen given dose dense for 4 cycles followed by Taxotere. She received her chemotherapy she received her chemotherapy from 09/20/2006 through February 2008. She then went on to lumpectomy on 02/07/2007. However there was essentially residual tumor dispersed over an area 1.8 cm with some close surgical margins at the medial area. Her case was reviewed at the breast conference and it was suggested she undergo mastectomy. Patient underwent a mastectomy on 03/19/2007.  #3 she was subsequently seen by radiation oncology for consideration of post mastectomy radiation therapy. However she was not given radiation.  #4 she was then begun on Femara 2.5 mg daily.  She has now completed 5 years of therapy. It was discontinued in June 2014.    CURRENT THERAPY: work up for recurrence  INTERVAL HISTORY: Gina [name] 67 y.o. female returns for  visit due to anemia.  She was last seen 2 days ago when we started this new anemia work up.  I.  She is in a wheelchair today and her weakness has worsened.  She is more fatigued as well.  She is in a wheelchair today as compared to being able to walk in on Wednesday, 2 days prior.  She has started taking the Tramadol I prescribed.  She takes one half tablet twice per day, and one whole tablet at night to help her sleep.  It has decreased her pain intensity, and has made her slightly drowsy.  She denies further fevers and chills, or any further concerns.    MEDICAL HISTORY: Past Medical History  Diagnosis Date  . Breast cancer 04/28/2013  . Myalgia and myositis, unspecified   . Anemia     ALLERGIES:  is allergic to sudafed.  MEDICATIONS:  Current Outpatient Prescriptions  Medication Sig Dispense Refill  . cetirizine (ZYRTEC) 10 MG tablet Take 10 mg by mouth daily.        Marland Kitchen omeprazole (PRILOSEC) 40 MG capsule Take 40 mg by mouth daily.      . mirtazapine (REMERON) 7.5 MG tablet Take 1 tablet (7.5 mg total) by mouth at bedtime.  30 tablet  0  . traMADol (ULTRAM) 50 MG tablet Take 0.5-1 tablets (25-50 mg total) by mouth every 6 (six) hours as needed for pain.  120 tablet  0   No current facility-administered medications for this visit.    SURGICAL HISTORY:  No past surgical history on file.  REVIEW OF SYSTEMS:  A 10 point review of systems was conducted and is otherwise negative except for what is noted above.    PHYSICAL EXAMINATION: Blood pressure 97/61, pulse 92, temperature 98.3 F (36.8 C), temperature source Oral, resp. rate 18, height 5\' 3"  (1.6 m), weight 100 lb (45.36 kg). Body mass index is 17.72 kg/(m^2). General: Patient is an ill appearing female, in wheelchair HEENT: PERRLA, sclerae anicteric,  conjunctival pallor, MMM Neck: supple, no palpable adenopathy Lungs: clear to auscultation bilaterally, no wheezes, rhonchi, or rales Cardiovascular: regular rate rhythm, S1, S2, no murmurs, rubs or gallops Abdomen: Soft, non-tender, non-distended, normoactive bowel sounds, no HSM Extremities: warm and well perfused, no clubbing, cyanosis, or edema Skin:  Pale, No rashes or lesions Neuro: Non-focal Breasts: right mastectomy site no nodularity or sign of recurrence, left breast no masses or nodules ECOG PERFORMANCE STATUS: 0 - Asymptomatic  LABORATORY DATA: Lab Results  Component Value Date   WBC 4.4 08/13/2013   HGB 13.1 08/13/2013   HCT 39.7 08/13/2013   MCV 88.0 08/13/2013   PLT 118* 08/13/2013      Chemistry      Component Value Date/Time   NA 139 08/13/2013 1039   NA 141 07/23/2013 2123   K 4.6 08/13/2013 1039   K 3.9 07/23/2013 2123   CL 106 07/23/2013 2123   CL 105 04/17/2013 1323   CO2 29 08/13/2013 1039   CO2 27 07/23/2013 2123   BUN 21.1 08/13/2013 1039   BUN 18 07/23/2013 2123   CREATININE 0.7 08/13/2013 1039   CREATININE 0.83 07/23/2013 2123      Component Value Date/Time   CALCIUM 9.7 08/13/2013 1039   CALCIUM 9.7 07/23/2013 2123   ALKPHOS 210* 08/13/2013 1039   ALKPHOS 68 04/17/2012 1314   AST 33 08/13/2013 1039   AST 21 04/17/2012 1314   ALT 16 08/13/2013 1039   ALT 16 04/17/2012 1314   BILITOT 0.32 08/13/2013 1039   BILITOT 0.5 04/17/2012 1314    REPORT OF SURGICAL PATHOLOGY  Case #: [id number] Patient Name: [name], [name] Office Chart Number: N/A  MRN: [id number] Pathologist: Havery Moros, MD DOB/Age Sep 18, 1946 (Age: 52) Gender: F Date Taken: 02/07/2007 Date Received: 02/07/2007  FINAL DIAGNOSIS  MICROSCOPIC EXAMINATION AND DIAGNOSIS  1. RIGHT AXILLARY SENTINEL LYMPH NODE, EXCISION: - METASTATIC CARCINOMA CONSISTENT WITH BREAST PRIMARY, SEE COMMENT.  2. RIGHT BREAST, NEEDLE LOCALIZATION BIOPSY: - RESIDUAL CARCINOMA IN TUMOR REGRESSION AREA. - CARCINOMA IS CLOSE  TO MEDIAL AND LATERAL SURGICAL MARGINS OF RESECTION. - SEE ONCOLOGY TABLE  COMMENT 1. The sections show metastatic carcinoma on H The tumor is seen in the form of dispersed atypical epithelioid cells in a fibrotic background involving an area more than 0.2 cm consistent with metastatic carcinoma. No extranodal extension is identified. For confirmation, Cytokeratin stain (AE1/AE3) was performed and is positive. The control stained appropriately.  ONCOLOGY TABLE-BREAST, INCISIONAL/EXCISIONAL BIOPSY OR MASTECTOMY WITH LYMPH NODES  1. Maximum tumor size (cm): Dispersed tumor cells in a tumor regression area measuring 1.8 cm, see comment 2. Margins: Tumor cells are seen less than 0.1 cm from the surgical margins of resection Invasive component, distance to closest margin: Less than 0.1 cm In situ component, distance to closest margin: N/A 3. Vascular/Lymphatic invasion: Yes 4. Histology, invasive component: Invasive mammary carcinoma consistent with lobular carcinoma 5. Grade, invasive component (Elston-Ellis modified Scarff-Bloom-Richardson): II Tubule formation grade: 3 Nuclear pleomorphism grade: 2 Mitotic grade: 1 6. Extensive intraductal component (  JCRT): No 7. Histology, in situ component: N/A 8. Grade, in situ component: N/A 9. Multicentric (separate tumors in different quadrants): Unknown 10. Multifocal (separate tumors in same quadrant or biopsy): No 11. Axillary lymph nodes: #examined: 1 #with metastasis: 1 ITC (isolated tumor cells, < 0.31mm): 0 Micrometastasis (> 0.49mm, < 2mm): 0 Metastasis > 2 mm: 1 Extracapsular extension: No 12. TNM Code: ypT1, pN1a, pMX 13. Breast Prognostic Markers: Case number [id number] Estrogen receptor positive (100%) Progesterone receptor positive (3%) Ki 67 (Mib-1) 12% Her 2 neu (HercepTest) 2+ Her 2 neu by FISH N/A 14. Non-neoplastic breast: Fibrosis 15. Comments: Sections of the localized tissue show a fibrotic area measuring 1.8  cm and displaying dispersed atypical epithelioid cells throughout that area. Some of the cells surround the benign appearing ductal elements. The findings are subtle and most compatible with residual carcinoma. For confirmation cytokeratin stains were performed on several blocks representing the localized tissue and show strong positivity in previously described epithelioid cells confirming the morphologic impression of residual carcinoma. The carcinoma is less than 0.1 cm from the medial surgical margin of resection and approximately 0.1 cm from the lateral surgical margin of resection. Since the findings are subtle and the tumor is essentially composed of dispersed cells throughout, the status of the medial and lateral margins in particular may be questionable and hence truly negative margins cannot be assured. I strongly recommend clinical correlation and followup. (BNS:caf 02/12/07)  RADIOGRAPHIC STUDIES:  No results found.  ASSESSMENT: 67 year old female with  #1 history of stage II invasive mammary carcinoma with lobular features originally diagnosed in 2007. She underwent neoadjuvant chemotherapy consisting of 4 cycles of dose dense FEC followed by dose dense Taxotere for 4 cycles. She then had a lumpectomy that revealed residual disease. She proceeded on to have a mastectomy. She was not a candidate for radiation therapy even though she did have an evaluation by radiation oncology.  #2 Dr. Donnie Coffin placed her on Femara 2.5 mg daily she overall tolerated it well. She is now finishing up 5 years of Femara. She discontinued this on 04/28/2013.  #3 Anemia   PLAN:   #1 Patient had a CT chest abdomen and pelvis that revealed diffuse bony lytic lesions consistent with widespread metastatic disease.  I discussed this with the patient.  I ordered a PET scan for further evaluation.    #2 proceed with blood transfusion today  #3 Return in 1 week for follow up   All questions were  answered. The patient knows to call the clinic with any problems, questions or concerns. We can certainly see the patient much sooner if necessary.  I spent 20 minutes counseling the patient face to face. The total time spent in the appointment was 25 minutes.  Drue Second, MD Medical/Oncology [facility] [phone] (beeper) [phone] (Office)

## 2013-08-17 ENCOUNTER — Other Ambulatory Visit: Payer: Self-pay | Admitting: Radiology

## 2013-08-18 ENCOUNTER — Encounter (HOSPITAL_COMMUNITY): Payer: Self-pay | Admitting: Pharmacy Technician

## 2013-08-20 ENCOUNTER — Ambulatory Visit (HOSPITAL_COMMUNITY)
Admission: RE | Admit: 2013-08-20 | Discharge: 2013-08-20 | Disposition: A | Discharge status: Home / Self Care | Payer: 59 | Source: Ambulatory Visit | Attending: Adult Health | Admitting: Adult Health

## 2013-08-20 ENCOUNTER — Encounter (HOSPITAL_COMMUNITY): Payer: Self-pay

## 2013-08-20 DIAGNOSIS — D696 Thrombocytopenia, unspecified: Secondary | ICD-10-CM | POA: Insufficient documentation

## 2013-08-20 DIAGNOSIS — C419 Malignant neoplasm of bone and articular cartilage, unspecified: Secondary | ICD-10-CM

## 2013-08-20 DIAGNOSIS — C50919 Malignant neoplasm of unspecified site of unspecified female breast: Secondary | ICD-10-CM

## 2013-08-20 DIAGNOSIS — Z853 Personal history of malignant neoplasm of breast: Secondary | ICD-10-CM | POA: Insufficient documentation

## 2013-08-20 DIAGNOSIS — D649 Anemia, unspecified: Secondary | ICD-10-CM | POA: Insufficient documentation

## 2013-08-20 DIAGNOSIS — C7951 Secondary malignant neoplasm of bone: Secondary | ICD-10-CM | POA: Insufficient documentation

## 2013-08-20 HISTORY — DX: Malignant neoplasm of bone and articular cartilage, unspecified: C41.9

## 2013-08-20 LAB — BONE MARROW EXAM

## 2013-08-20 LAB — CBC
HCT: 40.1 % (ref 36.0–46.0)
Hemoglobin: 13.4 g/dL (ref 12.0–15.0)
MCH: 30 pg (ref 26.0–34.0)
MCHC: 33.4 g/dL (ref 30.0–36.0)
Platelets: 128 10*3/uL — ABNORMAL LOW (ref 150–400)
RBC: 4.47 MIL/uL (ref 3.87–5.11)

## 2013-08-20 LAB — PROTIME-INR
INR: 1.2 (ref 0.00–1.49)
Prothrombin Time: 14.9 seconds (ref 11.6–15.2)

## 2013-08-20 LAB — APTT: aPTT: 31 seconds (ref 24–37)

## 2013-08-20 MED ORDER — HYDROCODONE-ACETAMINOPHEN 5-325 MG PO TABS
1.0000 | ORAL_TABLET | ORAL | Status: DC | PRN
  Filled 2013-08-20: qty 2

## 2013-08-20 MED ORDER — MIDAZOLAM HCL 2 MG/2ML IJ SOLN
INTRAMUSCULAR | Status: AC | PRN
  Administered 2013-08-20: 0.5 mg via INTRAVENOUS
  Administered 2013-08-20: 1 mg via INTRAVENOUS
  Administered 2013-08-20 (×2): 0.5 mg via INTRAVENOUS

## 2013-08-20 MED ORDER — SODIUM CHLORIDE 0.9 % IV SOLN
INTRAVENOUS | Status: DC
  Administered 2013-08-20: 08:00:00 via INTRAVENOUS

## 2013-08-20 MED ORDER — FENTANYL CITRATE 0.05 MG/ML IJ SOLN
INTRAMUSCULAR | Status: AC | PRN
  Administered 2013-08-20: 25 ug via INTRAVENOUS
  Administered 2013-08-20: 100 ug via INTRAVENOUS

## 2013-08-20 MED ORDER — MIDAZOLAM HCL 2 MG/2ML IJ SOLN
INTRAMUSCULAR | Status: AC
  Filled 2013-08-20: qty 6

## 2013-08-20 MED ORDER — FENTANYL CITRATE 0.05 MG/ML IJ SOLN
INTRAMUSCULAR | Status: AC
  Filled 2013-08-20: qty 6

## 2013-08-20 NOTE — H&P (Signed)
Chief Complaint: "I'm here for a bone marrow biopsy" Referring Physician:Khan HPI: Gina [name] is an 67 y.o. female with hx of breast cancer but also recently worked up for anemia and thrombocytopenia. She is scheduled for BM biopsy. PMHx and meds reviewed.   Past Medical History:  Past Medical History  Diagnosis Date  . Breast cancer 04/28/2013  . Myalgia and myositis, unspecified   . Anemia     Past Surgical History: History reviewed. No pertinent past surgical history.  Family History: History reviewed. No pertinent family history.  Social History:  reports that she has never smoked. She has never used smokeless tobacco. She reports that she drinks about 0.6 ounces of alcohol per week. She reports that she does not use illicit drugs.  Allergies:  Allergies  Allergen Reactions  . Sudafed [Pseudoephedrine] Anaphylaxis and Anxiety    Medications:   Medication List    ASK your doctor about these medications       cetirizine 10 MG tablet  Commonly known as:  ZYRTEC  Take 10 mg by mouth daily.     mirtazapine 7.5 MG tablet  Commonly known as:  REMERON  Take 1 tablet (7.5 mg total) by mouth at bedtime.     montelukast 10 MG tablet  Commonly known as:  SINGULAIR  Take 10 mg by mouth at bedtime.     omeprazole 40 MG capsule  Commonly known as:  PRILOSEC  Take 40 mg by mouth daily.     traMADol 50 MG tablet  Commonly known as:  ULTRAM  Take 0.5-1 tablets (25-50 mg total) by mouth every 6 (six) hours as needed for pain.        Please HPI for pertinent positives, otherwise complete 10 system ROS negative.  Physical Exam: BP 110/58  Pulse 102  Temp(Src) 98.5 F (36.9 C) (Oral)  Resp 16  SpO2 97% There is no weight on file to calculate BMI.   General Appearance:  Alert, cooperative, no distress, appears stated age  Head:  Normocephalic, without obvious abnormality, atraumatic  ENT: Unremarkable  Neck: Supple, symmetrical, trachea midline  Lungs:   Clear to  auscultation bilaterally, no w/r/r, respirations unlabored without use of accessory muscles.  Chest Wall:  No tenderness or deformity  Heart:  Regular rate and rhythm, S1, S2 normal, no murmur, rub or gallop.  Neurologic: Normal affect, no gross deficits.   Results for orders placed during the hospital encounter of 08/20/13 (from the past 48 hour(s))  APTT     Status: None   Collection Time    08/20/13  7:30 AM      Result Value Range   aPTT 31  24 - 37 seconds  CBC     Status: Abnormal   Collection Time    08/20/13  7:30 AM      Result Value Range   WBC 5.4  4.0 - 10.5 K/uL   RBC 4.47  3.87 - 5.11 MIL/uL   Hemoglobin 13.4  12.0 - 15.0 g/dL   HCT 16.1  09.6 - 04.5 %   MCV 89.7  78.0 - 100.0 fL   MCH 30.0  26.0 - 34.0 pg   MCHC 33.4  30.0 - 36.0 g/dL   RDW 40.9  81.1 - 91.4 %   Platelets 128 (*) 150 - 400 K/uL  PROTIME-INR     Status: None   Collection Time    08/20/13  7:30 AM      Result Value Range   Prothrombin  Time 14.9  11.6 - 15.2 seconds   INR 1.20  0.00 - 1.49   No results found.  Assessment/Plan Hx breast cancer Anemia/thrombocytopenia Discussed CT guided bone marrow biopsy, risks, complications, use of sedation. Labs reviewed. Consent signed in chart  Brayton El PA-C 08/20/2013, 8:48 AM

## 2013-08-20 NOTE — H&P (Signed)
Agree with PA note.  Signed,  Lydie Stammen K. Lashawnda Hancox, MD [facility]  

## 2013-08-20 NOTE — Procedures (Signed)
Interventional Radiology Procedure Note  Procedure: CT guided aspirate and core biopsy of right iliac bone Complications: None Recommendations: - Bedrest supine x 2 hrs - Hydrocodone PRN  Pain - Follow biopsy results  Signed,  Heath K. McCullough, MD [facility]  

## 2013-08-21 ENCOUNTER — Encounter (HOSPITAL_COMMUNITY)
Admission: RE | Admit: 2013-08-21 | Discharge: 2013-08-21 | Disposition: A | Discharge status: Home / Self Care | Payer: 59 | Source: Ambulatory Visit | Attending: Adult Health | Admitting: Adult Health

## 2013-08-21 ENCOUNTER — Ambulatory Visit (HOSPITAL_BASED_OUTPATIENT_CLINIC_OR_DEPARTMENT_OTHER): Payer: 59 | Admitting: Adult Health

## 2013-08-21 ENCOUNTER — Encounter: Payer: Self-pay | Admitting: Adult Health

## 2013-08-21 ENCOUNTER — Other Ambulatory Visit: Payer: 59 | Admitting: Lab

## 2013-08-21 ENCOUNTER — Encounter (HOSPITAL_COMMUNITY): Payer: Self-pay

## 2013-08-21 VITALS — BP 95/63 | HR 109 | Temp 99.4°F | Resp 20 | Ht 63.0 in | Wt 96.7 lb

## 2013-08-21 DIAGNOSIS — R918 Other nonspecific abnormal finding of lung field: Secondary | ICD-10-CM | POA: Insufficient documentation

## 2013-08-21 DIAGNOSIS — F329 Major depressive disorder, single episode, unspecified: Secondary | ICD-10-CM

## 2013-08-21 DIAGNOSIS — D649 Anemia, unspecified: Secondary | ICD-10-CM

## 2013-08-21 DIAGNOSIS — C7951 Secondary malignant neoplasm of bone: Secondary | ICD-10-CM | POA: Insufficient documentation

## 2013-08-21 DIAGNOSIS — C50919 Malignant neoplasm of unspecified site of unspecified female breast: Secondary | ICD-10-CM

## 2013-08-21 DIAGNOSIS — Z9010 Acquired absence of unspecified breast and nipple: Secondary | ICD-10-CM | POA: Insufficient documentation

## 2013-08-21 DIAGNOSIS — E43 Unspecified severe protein-calorie malnutrition: Secondary | ICD-10-CM

## 2013-08-21 DIAGNOSIS — Z853 Personal history of malignant neoplasm of breast: Secondary | ICD-10-CM

## 2013-08-21 LAB — UIFE/LIGHT CHAINS/TP QN, 24-HR UR
Albumin, U: DETECTED
Free Lambda Excretion/Day: 6.44 mg/d
Free Lambda Lt Chains,Ur: 0.92 mg/dL — ABNORMAL HIGH (ref 0.02–0.67)
Time: 24 hours
Total Protein, Urine-Ur/day: 143 mg/d — ABNORMAL HIGH (ref 10–140)
Total Protein, Urine: 20.4 mg/dL
Volume, Urine: 700 mL

## 2013-08-21 LAB — GLUCOSE, CAPILLARY: Glucose-Capillary: 111 mg/dL — ABNORMAL HIGH (ref 70–99)

## 2013-08-21 MED ORDER — FLUDEOXYGLUCOSE F - 18 (FDG) INJECTION: 956.0000 | Freq: Once | INTRAVENOUS | Status: DC | PRN

## 2013-08-21 MED ORDER — FLUDEOXYGLUCOSE F - 18 (FDG) INJECTION
19.8000 | Freq: Once | INTRAVENOUS | Status: AC | PRN
  Administered 2013-08-21: 19.8 via INTRAVENOUS

## 2013-08-21 MED ORDER — MIRTAZAPINE 15 MG PO TABS: 15.0000 mg | ORAL_TABLET | Freq: Every day | ORAL | Status: DC

## 2013-08-21 NOTE — Progress Notes (Addendum)
OFFICE PROGRESS NOTE  CC  Gina Hoff, MD [address] [place] [address] invasive mammary carcinoma with lobular features. Diagnosed in October 2007 Dr. Teodora Medici Dr.Daniel Clark-pearson  DIAGNOSIS: 67 year old female with diagnosis of stage II (T1 N1)  PRIOR THERAPY:  #1 patient presented with a mass in her right breast in October 2007. She underwent bilateral mammograms. The right breast ultrasound performed on 08/19/2006 showed increased density and distortion in the subareolar location of the right breast. Ultrasound showed abnormal echoes in the subareolar location which was a change measuring 1.5 cm. Patient underwent an ultrasound-guided biopsy on 08/23/2006 which showed invasive mammary carcinoma with lobular features. ER +100% PR 3% proliferation marker Ki-67 12% HER-2/neu 2+ by fish. MRI of the breasts on 08/30/2006 showed an ill-defined enhancing mass in the right breast measuring 2.6 x 4.0 x 4.1 cm. Enhancement trailed out to the nipple but did not involve the nipple. There was also a small enhancing internal mammary node measuring 7 mm suspicious for metastatic disease. Left breast was negative.  #2 patient was seen by Dr. Beatris Ship on 09/09/2006. She received neoadjuvant chemotherapy initially consisting of FEC regimen given dose dense for 4 cycles followed by Taxotere. She received her chemotherapy she received her chemotherapy from 09/20/2006 through February 2008. She then went on to lumpectomy on 02/07/2007. However there was essentially residual tumor dispersed over an area 1.8 cm with some close surgical margins at the medial area. Her case was reviewed at the breast conference and it was suggested she undergo mastectomy. Patient underwent a mastectomy on 03/19/2007.  #3 she was subsequently seen by radiation oncology for consideration of post mastectomy radiation therapy. However, she was not given radiation.  #4 she was then begun on Femara 2.5  mg daily. She has now completed 5 years of therapy. It was discontinued in June 2014.    #5 Returned to clinic on 08/05/13 for evaluation and management of new anemia.  She has underwent CT chest abdomen and pelvis which reveals diffuse lytic lesions throughout the axial and appendicular skeleton.  Work up is ongoing  CURRENT THERAPY: anemia and lytic lesion work up  INTERVAL HISTORY: Gina [name] 67 y.o. female returns for evaluation of her anemia and new lytic lesions.  She underwent a bone marrow biopsy yesterday.  She tolerated that well.  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  She is very frustrated, and depressed with this diagnosis and the effect on her quality of life.  She continues to struggle with eating.  She denies fevers, chills, nausea, vomiting, constipation, diarrhea, or further concerns.  Her pain is well controlled with Ultram.    MEDICAL HISTORY: Past Medical History  Diagnosis Date  . Breast cancer 04/28/2013  . Myalgia and myositis, unspecified   . Anemia     ALLERGIES:  is allergic to sudafed.  MEDICATIONS:  Current Outpatient Prescriptions  Medication Sig Dispense Refill  . cetirizine (ZYRTEC) 10 MG tablet Take 10 mg by mouth daily.        . mirtazapine (REMERON) 15 MG tablet Take 1 tablet (15 mg total) by mouth at bedtime.  30 tablet  6  . montelukast (SINGULAIR) 10 MG tablet Take 10 mg by mouth at bedtime.      Marland Kitchen omeprazole (PRILOSEC) 40 MG capsule Take 40 mg by mouth daily.      . traMADol (ULTRAM) 50 MG tablet Take 0.5-1 tablets (25-50  mg total) by mouth every 6 (six) hours as needed for pain.  120 tablet  0   No current facility-administered medications for this visit.    SURGICAL HISTORY: No past surgical history on file.  REVIEW OF SYSTEMS:  A 10 point review of systems was conducted and is otherwise negative except for what is noted above.    PHYSICAL EXAMINATION: Blood  pressure 95/63, pulse 109, temperature 99.4 F (37.4 C), temperature source Oral, resp. rate 20, height 5\' 3"  (1.6 m), weight 96 lb 11.2 oz (43.863 kg). Body mass index is 17.13 kg/(m^2). General: Patient is an ill appearing female, in wheelchair, improved from last exam HEENT: PERRLA, sclerae anicteric, conjunctival pallor, MMM Neck: supple, no palpable adenopathy Lungs: clear to auscultation bilaterally, no wheezes, rhonchi, or rales Cardiovascular: regular rate rhythm, S1, S2, no murmurs, rubs or gallops Abdomen: Soft, non-tender, non-distended, normoactive bowel sounds, no HSM Extremities: warm and well perfused, no clubbing, cyanosis, or edema Skin:  Pale, No rashes or lesions Neuro: Non-focal Breasts: deferred ECOG PERFORMANCE STATUS: 0 - Asymptomatic  LABORATORY DATA: Lab Results  Component Value Date   WBC 5.4 08/20/2013   HGB 13.4 08/20/2013   HCT 40.1 08/20/2013   MCV 89.7 08/20/2013   PLT 128* 08/20/2013      Chemistry      Component Value Date/Time   NA 139 08/13/2013 1039   NA 141 07/23/2013 2123   K 4.6 08/13/2013 1039   K 3.9 07/23/2013 2123   CL 106 07/23/2013 2123   CL 105 04/17/2013 1323   CO2 29 08/13/2013 1039   CO2 27 07/23/2013 2123   BUN 21.1 08/13/2013 1039   BUN 18 07/23/2013 2123   CREATININE 0.7 08/13/2013 1039   CREATININE 0.83 07/23/2013 2123      Component Value Date/Time   CALCIUM 9.7 08/13/2013 1039   CALCIUM 9.7 07/23/2013 2123   ALKPHOS 210* 08/13/2013 1039   ALKPHOS 68 04/17/2012 1314   AST 33 08/13/2013 1039   AST 21 04/17/2012 1314   ALT 16 08/13/2013 1039   ALT 16 04/17/2012 1314   BILITOT 0.32 08/13/2013 1039   BILITOT 0.5 04/17/2012 1314    REPORT OF SURGICAL PATHOLOGY  Case #: [id number] Patient Name: [name], [name] Office Chart Number: N/A  MRN: [id number] Pathologist: Havery Moros, MD DOB/Age [birth year] (Age: 50) Gender: F Date Taken: 02/07/2007 Date Received: 02/07/2007  FINAL DIAGNOSIS  MICROSCOPIC EXAMINATION AND DIAGNOSIS  1. RIGHT  AXILLARY SENTINEL LYMPH NODE, EXCISION: - METASTATIC CARCINOMA CONSISTENT WITH BREAST PRIMARY, SEE COMMENT.  2. RIGHT BREAST, NEEDLE LOCALIZATION BIOPSY: - RESIDUAL CARCINOMA IN TUMOR REGRESSION AREA. - CARCINOMA IS CLOSE TO MEDIAL AND LATERAL SURGICAL MARGINS OF RESECTION. - SEE ONCOLOGY TABLE  COMMENT 1. The sections show metastatic carcinoma on H The tumor is seen in the form of dispersed atypical epithelioid cells in a fibrotic background involving an area more than 0.2 cm consistent with metastatic carcinoma. No extranodal extension is identified. For confirmation, Cytokeratin stain (AE1/AE3) was performed and is positive. The control stained appropriately.  ONCOLOGY TABLE-BREAST, INCISIONAL/EXCISIONAL BIOPSY OR MASTECTOMY WITH LYMPH NODES  1. Maximum tumor size (cm): Dispersed tumor cells in a tumor regression area measuring 1.8 cm, see comment 2. Margins: Tumor cells are seen less than 0.1 cm from the surgical margins of resection Invasive component, distance to closest margin: Less than 0.1 cm In situ component, distance to closest margin: N/A 3. Vascular/Lymphatic invasion: Yes 4. Histology, invasive component: Invasive mammary carcinoma consistent with lobular carcinoma  5. Grade, invasive component (Elston-Ellis modified Scarff-Bloom-Richardson): II Tubule formation grade: 3 Nuclear pleomorphism grade: 2 Mitotic grade: 1 6. Extensive intraductal component (JCRT): No 7. Histology, in situ component: N/A 8. Grade, in situ component: N/A 9. Multicentric (separate tumors in different quadrants): Unknown 10. Multifocal (separate tumors in same quadrant or biopsy): No 11. Axillary lymph nodes: #examined: 1 #with metastasis: 1 ITC (isolated tumor cells, < 0.65mm): 0 Micrometastasis (> 0.37mm, < 2mm): 0 Metastasis > 2 mm: 1 Extracapsular extension: No 12. TNM Code: ypT1, pN1a, pMX 13. Breast Prognostic Markers: Case number [id number] Estrogen receptor positive  (100%) Progesterone receptor positive (3%) Ki 67 (Mib-1) 12% Her 2 neu (HercepTest) 2+ Her 2 neu by FISH N/A 14. Non-neoplastic breast: Fibrosis 15. Comments: Sections of the localized tissue show a fibrotic area measuring 1.8 cm and displaying dispersed atypical epithelioid cells throughout that area. Some of the cells surround the benign appearing ductal elements. The findings are subtle and most compatible with residual carcinoma. For confirmation cytokeratin stains were performed on several blocks representing the localized tissue and show strong positivity in previously described epithelioid cells confirming the morphologic impression of residual carcinoma. The carcinoma is less than 0.1 cm from the medial surgical margin of resection and approximately 0.1 cm from the lateral surgical margin of resection. Since the findings are subtle and the tumor is essentially composed of dispersed cells throughout, the status of the medial and lateral margins in particular may be questionable and hence truly negative margins cannot be assured. I strongly recommend clinical correlation and followup. (BNS:caf 02/12/07)  RADIOGRAPHIC STUDIES:  No results found.  ASSESSMENT: 67 year old female with  #1 history of stage II invasive mammary carcinoma with lobular features originally diagnosed in 2007. She underwent neoadjuvant chemotherapy consisting of 4 cycles of dose dense FEC followed by dose dense Taxotere for 4 cycles. She then had a lumpectomy that revealed residual disease. She proceeded on to have a mastectomy. She was not a candidate for radiation therapy even though she did have an evaluation by radiation oncology.  #2 Dr. Donnie Coffin placed her on Femara 2.5 mg daily she overall tolerated it well. She completed 5 years of Femara. She discontinued this on 04/28/2013.  #3 Anemia--undergoing evaluation.    #4 Severe malnutrition   PLAN:   #1 Patient is very discouraged today with the  length of time it has taken for her work up to take place.  We discussed the PET/CT results and the bone marrow biopsy results are still pending.  We discussed Xgeva today.  The myeloma panel was negative.    #2.  I increased the patient's mirtazapine to 15mg  qhs.     #3  I referred the patient to Vernell Leep, in nutrition for evaluation.  She has an appt on 08/24/13.    #4 Gina [name] will return on Wednesday, 08/26/13 for labs an appointment and evaluation.    All questions were answered. The patient knows to call the clinic with any problems, questions or concerns. We can certainly see the patient much sooner if necessary.  I spent 25 minutes counseling the patient face to face. The total time spent in the appointment was 30 minutes.   Cherie Ouch Lyn Hollingshead, NP Medical Oncology [facility] Phone: [phone] 08/22/2013, 8:47 AM  ATTENDING'S ATTESTATION:  I personally reviewed patient's chart, examined patient myself, formulated the treatment plan as followed.    Today we discussed CT scans as well as PET scan and bone marrow biopsy results which  are still pending. She is receiving XGeva for her bones. We discussed the side effects of this. We discussed remainder of her lab results which especially the myeloma panel was negative. I do think that she has recurrence of her breast cancer. Hopefully the bone marrow biopsy will make this clear her. Patient is severely malnourished and we have referred her to our dietitian for further management. Patient will be seen back in the few days for followup  Drue Second, MD Medical/Oncology [facility] [phone] (beeper) [phone] (Office)  09/14/2013, 10:01 AM

## 2013-08-21 NOTE — Patient Instructions (Addendum)
Denosumab injection What is this medicine? DENOSUMAB slows bone breakdown. It is used to treat osteoporosis in women after menopause and in men. This medicine is also used to prevent bone fractures and other bone problems caused by cancer bone metastases. This medicine may be used for other purposes; ask your health care provider or pharmacist if you have questions. What should I tell my health care provider before I take this medicine? They need to know if you have any of these conditions: -dental disease -eczema -infection or history of infections -kidney disease or on dialysis -low blood calcium or vitamin D -malabsorption syndrome -scheduled to have surgery or tooth extraction -taking medicine that contains denosumab -thyroid or parathyroid disease -an unusual reaction to denosumab, other medicines, foods, dyes, or preservatives -pregnant or trying to get pregnant -breast-feeding How should I use this medicine? This medicine is for injection under the skin. It is given by a health care professional in a hospital or clinic setting. If you are getting Prolia, a special MedGuide will be given to you by the pharmacist with each prescription and refill. Be sure to read this information carefully each time. Talk to your pediatrician regarding the use of this medicine in children. Special care may be needed. Overdosage: If you think you've taken too much of this medicine contact a poison control center or emergency room at once. Overdosage: If you think you have taken too much of this medicine contact a poison control center or emergency room at once. NOTE: This medicine is only for you. Do not share this medicine with others. What if I miss a dose? It is important not to miss your dose. Call your doctor or health care professional if you are unable to keep an appointment. What may interact with this medicine? Do not take this medicine with any of the following medications: -other medicines  containing denosumab This medicine may also interact with the following medications: -medicines that suppress the immune system -medicines that treat cancer -steroid medicines like prednisone or cortisone This list may not describe all possible interactions. Give your health care provider a list of all the medicines, herbs, non-prescription drugs, or dietary supplements you use. Also tell them if you smoke, drink alcohol, or use illegal drugs. Some items may interact with your medicine. What should I watch for while using this medicine? Visit your doctor or health care professional for regular checks on your progress. Your doctor or health care professional may order blood tests and other tests to see how you are doing. Call your doctor or health care professional if you get a cold or other infection while receiving this medicine. Do not treat yourself. This medicine may decrease your body's ability to fight infection. You should make sure you get enough calcium and vitamin D while you are taking this medicine, unless your doctor tells you not to. Discuss the foods you eat and the vitamins you take with your health care professional. See your dentist regularly. Brush and floss your teeth as directed. Before you have any dental work done, tell your dentist you are receiving this medicine. What side effects may I notice from receiving this medicine? Side effects that you should report to your doctor or health care professional as soon as possible: -allergic reactions like skin rash, itching or hives, swelling of the face, lips, or tongue -breathing problems -chest pain -fast, irregular heartbeat -feeling faint or lightheaded, falls -fever, chills, or any other sign of infection -muscle spasms, tightening, or twitches -numbness   or tingling -skin blisters or bumps, or is dry, peels, or red -slow healing or unexplained pain in the mouth or jaw -unusual bleeding or bruising Side effects that  usually do not require medical attention (Report these to your doctor or health care professional if they continue or are bothersome.): -muscle pain -stomach upset, gas This list may not describe all possible side effects. Call your doctor for medical advice about side effects. You may report side effects to FDA at 1-800-FDA-1088. Where should I keep my medicine? This medicine is only given in a clinic, doctor's office, or other health care setting and will not be stored at home. NOTE: This sheet is a summary. It may not cover all possible information. If you have questions about this medicine, talk to your doctor, pharmacist, or health care provider.  2013, [employer]. (08/21/2011 3:40:41 PM)  

## 2013-08-24 ENCOUNTER — Ambulatory Visit: Payer: 59 | Admitting: Nutrition

## 2013-08-24 NOTE — Progress Notes (Signed)
67 year old female diagnosed with breast cancer in 2007.  She is status post therapy.  She has completed Femara.  Past medical history includes myalgia and anemia.  Medications include Remeron and Prilosec.  Labs include albumin 2.6 on September 18.  Height: 63 inches. Weight: 96.7 pounds. Usual body weight: 111 pounds June 2014. BMI: 17.13.  Patient reports poor appetite.  She drinks ensure or boost twice a day.  She enjoys orange juice and power aid.  She eats small amounts of fruits.  She does report metallic taste as well as dry heaves twice this past week.  Patient is anxious about pending workup.  Nutrition diagnosis: Inadequate oral intake related to poor appetite as evidenced by patient's dietary history, continued weight loss, and BMI of 17.13, which is underweight.  Patient meets criteria for Severe malnutrition in the context of chronic illness secondary to severe depletion of muscle and fat stores on physical exam.  Intervention: Patient was educated to consume high-calorie, high-protein foods and beverages in small amounts throughout the day.  She was educated to eat by the clock rather than waiting for her body to signal that she is hungry.  I've educated her on strategies for foods to get the most calories.  I provided fact sheets on assisting with taste alterations, nausea and vomiting, and ways to increase calories and protein.  I provided her with multiple samples of oral nutrition supplements for her to try.  She understands where she can purchase these products.  Questions were answered.  Teach back method used.   Monitoring, evaluation, goals: Patient will tolerate increased oral intake to minimize further weight loss and improve quality-of-life.  Next visit: Patient has my contact information for questions or concerns

## 2013-08-25 ENCOUNTER — Telehealth: Payer: Self-pay | Admitting: Oncology

## 2013-08-25 ENCOUNTER — Telehealth: Payer: Self-pay | Admitting: *Deleted

## 2013-08-25 NOTE — Telephone Encounter (Signed)
Per staff message and POF I have scheduled appts.  JMW  

## 2013-08-25 NOTE — Telephone Encounter (Signed)
, °

## 2013-08-26 ENCOUNTER — Ambulatory Visit (HOSPITAL_BASED_OUTPATIENT_CLINIC_OR_DEPARTMENT_OTHER): Payer: 59 | Admitting: Adult Health

## 2013-08-26 ENCOUNTER — Other Ambulatory Visit: Payer: Self-pay | Admitting: Emergency Medicine

## 2013-08-26 ENCOUNTER — Encounter: Payer: Self-pay | Admitting: Adult Health

## 2013-08-26 ENCOUNTER — Ambulatory Visit (HOSPITAL_BASED_OUTPATIENT_CLINIC_OR_DEPARTMENT_OTHER): Payer: 59

## 2013-08-26 ENCOUNTER — Other Ambulatory Visit (HOSPITAL_BASED_OUTPATIENT_CLINIC_OR_DEPARTMENT_OTHER): Payer: 59 | Admitting: Lab

## 2013-08-26 ENCOUNTER — Telehealth: Payer: Self-pay | Admitting: *Deleted

## 2013-08-26 VITALS — BP 93/63 | HR 132 | Temp 99.6°F | Resp 20 | Ht 63.0 in | Wt 94.9 lb

## 2013-08-26 VITALS — BP 108/61 | Temp 99.3°F | Resp 20

## 2013-08-26 DIAGNOSIS — C50919 Malignant neoplasm of unspecified site of unspecified female breast: Secondary | ICD-10-CM

## 2013-08-26 DIAGNOSIS — E46 Unspecified protein-calorie malnutrition: Secondary | ICD-10-CM

## 2013-08-26 DIAGNOSIS — D649 Anemia, unspecified: Secondary | ICD-10-CM

## 2013-08-26 DIAGNOSIS — C50419 Malignant neoplasm of upper-outer quadrant of unspecified female breast: Secondary | ICD-10-CM

## 2013-08-26 DIAGNOSIS — C50219 Malignant neoplasm of upper-inner quadrant of unspecified female breast: Secondary | ICD-10-CM

## 2013-08-26 DIAGNOSIS — C7951 Secondary malignant neoplasm of bone: Secondary | ICD-10-CM

## 2013-08-26 DIAGNOSIS — C50319 Malignant neoplasm of lower-inner quadrant of unspecified female breast: Secondary | ICD-10-CM

## 2013-08-26 DIAGNOSIS — E860 Dehydration: Secondary | ICD-10-CM

## 2013-08-26 LAB — COMPREHENSIVE METABOLIC PANEL (CC13)
ALT: 24 U/L (ref 0–55)
Albumin: 2.3 g/dL — ABNORMAL LOW (ref 3.5–5.0)
BUN: 18.6 mg/dL (ref 7.0–26.0)
CO2: 27 mEq/L (ref 22–29)
Calcium: 9.7 mg/dL (ref 8.4–10.4)
Chloride: 95 mEq/L — ABNORMAL LOW (ref 98–109)
Creatinine: 0.7 mg/dL (ref 0.6–1.1)
Potassium: 4.4 mEq/L (ref 3.5–5.1)
Sodium: 135 mEq/L — ABNORMAL LOW (ref 136–145)
Total Bilirubin: 0.82 mg/dL (ref 0.20–1.20)

## 2013-08-26 LAB — CBC WITH DIFFERENTIAL/PLATELET
BASO%: 1.8 % (ref 0.0–2.0)
EOS%: 0.8 % (ref 0.0–7.0)
Eosinophils Absolute: 0.1 10*3/uL (ref 0.0–0.5)
HCT: 34.2 % — ABNORMAL LOW (ref 34.8–46.6)
LYMPH%: 16 % (ref 14.0–49.7)
MCH: 29 pg (ref 25.1–34.0)
MCHC: 32.5 g/dL (ref 31.5–36.0)
MONO#: 1.2 10*3/uL — ABNORMAL HIGH (ref 0.1–0.9)
NEUT%: 63.3 % (ref 38.4–76.8)
Platelets: 128 10*3/uL — ABNORMAL LOW (ref 145–400)
RBC: 3.83 10*6/uL (ref 3.70–5.45)
RDW: 15.1 % — ABNORMAL HIGH (ref 11.2–14.5)
WBC: 6.6 10*3/uL (ref 3.9–10.3)
nRBC: 2 % — ABNORMAL HIGH (ref 0–0)

## 2013-08-26 LAB — HOLD TUBE, BLOOD BANK

## 2013-08-26 MED ORDER — DENOSUMAB 120 MG/1.7ML ~~LOC~~ SOLN
120.0000 mg | Freq: Once | SUBCUTANEOUS | Status: AC
  Administered 2013-08-26: 120 mg via SUBCUTANEOUS
  Filled 2013-08-26: qty 1.7

## 2013-08-26 MED ORDER — SODIUM CHLORIDE 0.9 % IV SOLN
1000.0000 mL | Freq: Once | INTRAVENOUS | Status: DC
  Administered 2013-08-26: 1000 mL via INTRAVENOUS

## 2013-08-26 MED ORDER — ANASTROZOLE 1 MG PO TABS: 1.0000 mg | ORAL_TABLET | Freq: Every day | ORAL | Status: DC

## 2013-08-26 NOTE — Patient Instructions (Signed)
Denosumab injection What is this medicine? DENOSUMAB slows bone breakdown. It is used to treat osteoporosis in women after menopause and in men. This medicine is also used to prevent bone fractures and other bone problems caused by cancer bone metastases. This medicine may be used for other purposes; ask your health care provider or pharmacist if you have questions. What should I tell my health care provider before I take this medicine? They need to know if you have any of these conditions: -dental disease -eczema -infection or history of infections -kidney disease or on dialysis -low blood calcium or vitamin D -malabsorption syndrome -scheduled to have surgery or tooth extraction -taking medicine that contains denosumab -thyroid or parathyroid disease -an unusual reaction to denosumab, other medicines, foods, dyes, or preservatives -pregnant or trying to get pregnant -breast-feeding How should I use this medicine? This medicine is for injection under the skin. It is given by a health care professional in a hospital or clinic setting. If you are getting Prolia, a special MedGuide will be given to you by the pharmacist with each prescription and refill. Be sure to read this information carefully each time. Talk to your pediatrician regarding the use of this medicine in children. Special care may be needed. Overdosage: If you think you've taken too much of this medicine contact a poison control center or emergency room at once. Overdosage: If you think you have taken too much of this medicine contact a poison control center or emergency room at once. NOTE: This medicine is only for you. Do not share this medicine with others. What if I miss a dose? It is important not to miss your dose. Call your doctor or health care professional if you are unable to keep an appointment. What may interact with this medicine? Do not take this medicine with any of the following medications: -other medicines  containing denosumab This medicine may also interact with the following medications: -medicines that suppress the immune system -medicines that treat cancer -steroid medicines like prednisone or cortisone This list may not describe all possible interactions. Give your health care provider a list of all the medicines, herbs, non-prescription drugs, or dietary supplements you use. Also tell them if you smoke, drink alcohol, or use illegal drugs. Some items may interact with your medicine. What should I watch for while using this medicine? Visit your doctor or health care professional for regular checks on your progress. Your doctor or health care professional may order blood tests and other tests to see how you are doing. Call your doctor or health care professional if you get a cold or other infection while receiving this medicine. Do not treat yourself. This medicine may decrease your body's ability to fight infection. You should make sure you get enough calcium and vitamin D while you are taking this medicine, unless your doctor tells you not to. Discuss the foods you eat and the vitamins you take with your health care professional. See your dentist regularly. Brush and floss your teeth as directed. Before you have any dental work done, tell your dentist you are receiving this medicine. What side effects may I notice from receiving this medicine? Side effects that you should report to your doctor or health care professional as soon as possible: -allergic reactions like skin rash, itching or hives, swelling of the face, lips, or tongue -breathing problems -chest pain -fast, irregular heartbeat -feeling faint or lightheaded, falls -fever, chills, or any other sign of infection -muscle spasms, tightening, or twitches -numbness  or tingling -skin blisters or bumps, or is dry, peels, or red -slow healing or unexplained pain in the mouth or jaw -unusual bleeding or bruising Side effects that  usually do not require medical attention (Report these to your doctor or health care professional if they continue or are bothersome.): -muscle pain -stomach upset, gas This list may not describe all possible side effects. Call your doctor for medical advice about side effects. You may report side effects to FDA at 1-800-FDA-1088. Where should I keep my medicine? This medicine is only given in a clinic, doctor's office, or other health care setting and will not be stored at home. NOTE: This sheet is a summary. It may not cover all possible information. If you have questions about this medicine, talk to your doctor, pharmacist, or health care provider.  2013, Elsevier/Gold Standard. (08/21/2011 3:40:41 PM)   Anastrozole tablets What is this medicine? ANASTROZOLE (an AS troe zole) is used to treat breast cancer in women who have gone through menopause. Some types of breast cancer depend on estrogen to grow, and this medicine can stop tumor growth by blocking estrogen production. This medicine may be used for other purposes; ask your health care provider or pharmacist if you have questions. What should I tell my health care provider before I take this medicine? They need to know if you have any of these conditions: -liver disease -an unusual or allergic reaction to anastrozole, other medicines, foods, dyes, or preservatives -pregnant or trying to get pregnant -breast-feeding How should I use this medicine? Take this medicine by mouth with a glass of water. Follow the directions on the prescription label. You can take this medicine with or without food. Take your doses at regular intervals. Do not take your medicine more often than directed. Do not stop taking except on the advice of your doctor or health care professional. Talk to your pediatrician regarding the use of this medicine in children. Special care may be needed. Overdosage: If you think you have taken too much of this medicine contact  a poison control center or emergency room at once. NOTE: This medicine is only for you. Do not share this medicine with others. What if I miss a dose? If you miss a dose, take it as soon as you can. If it is almost time for your next dose, take only that dose. Do not take double or extra doses. What may interact with this medicine? Do not take this medicine with any of the following medications: -female hormones, like estrogens or progestins and birth control pills This medicine may also interact with the following medications: -tamoxifen This list may not describe all possible interactions. Give your health care provider a list of all the medicines, herbs, non-prescription drugs, or dietary supplements you use. Also tell them if you smoke, drink alcohol, or use illegal drugs. Some items may interact with your medicine. What should I watch for while using this medicine? Visit your doctor or health care professional for regular checks on your progress. Let your doctor or health care professional know about any unusual vaginal bleeding. Do not treat yourself for diarrhea, nausea, vomiting or other side effects. Ask your doctor or health care professional for advice. What side effects may I notice from receiving this medicine? Side effects that you should report to your doctor or health care professional as soon as possible: -allergic reactions like skin rash, itching or hives, swelling of the face, lips, or tongue -any new or unusual symptoms -breathing  problems -chest pain -leg pain or swelling -vomiting Side effects that usually do not require medical attention (report to your doctor or health care professional if they continue or are bothersome): -back or bone pain -cough, or throat infection -diarrhea or constipation -dizziness -headache -hot flashes -loss of appetite -nausea -sweating -weakness and tiredness -weight gain This list may not describe all possible side effects. Call  your doctor for medical advice about side effects. You may report side effects to FDA at 1-800-FDA-1088. Where should I keep my medicine? Keep out of the reach of children. Store at room temperature between 20 and 25 degrees C (68 and 77 degrees F). Throw away any unused medicine after the expiration date. NOTE: This sheet is a summary. It may not cover all possible information. If you have questions about this medicine, talk to your doctor, pharmacist, or health care provider.  2013, Elsevier/Gold Standard. (01/23/2008 4:31:52 PM)

## 2013-08-26 NOTE — Telephone Encounter (Signed)
appts made and printed...td 

## 2013-08-26 NOTE — Patient Instructions (Addendum)
Denosumab injection What is this medicine? DENOSUMAB slows bone breakdown. It is used to treat osteoporosis in women after menopause and in men. This medicine is also used to prevent bone fractures and other bone problems caused by cancer bone metastases. This medicine may be used for other purposes; ask your health care provider or pharmacist if you have questions. What should I tell my health care provider before I take this medicine? They need to know if you have any of these conditions: -dental disease -eczema -infection or history of infections -kidney disease or on dialysis -low blood calcium or vitamin D -malabsorption syndrome -scheduled to have surgery or tooth extraction -taking medicine that contains denosumab -thyroid or parathyroid disease -an unusual reaction to denosumab, other medicines, foods, dyes, or preservatives -pregnant or trying to get pregnant -breast-feeding How should I use this medicine? This medicine is for injection under the skin. It is given by a health care professional in a hospital or clinic setting. If you are getting Prolia, a special MedGuide will be given to you by the pharmacist with each prescription and refill. Be sure to read this information carefully each time. Talk to your pediatrician regarding the use of this medicine in children. Special care may be needed. Overdosage: If you think you've taken too much of this medicine contact a poison control center or emergency room at once. Overdosage: If you think you have taken too much of this medicine contact a poison control center or emergency room at once. NOTE: This medicine is only for you. Do not share this medicine with others. What if I miss a dose? It is important not to miss your dose. Call your doctor or health care professional if you are unable to keep an appointment. What may interact with this medicine? Do not take this medicine with any of the following medications: -other medicines  containing denosumab This medicine may also interact with the following medications: -medicines that suppress the immune system -medicines that treat cancer -steroid medicines like prednisone or cortisone This list may not describe all possible interactions. Give your health care provider a list of all the medicines, herbs, non-prescription drugs, or dietary supplements you use. Also tell them if you smoke, drink alcohol, or use illegal drugs. Some items may interact with your medicine. What should I watch for while using this medicine? Visit your doctor or health care professional for regular checks on your progress. Your doctor or health care professional may order blood tests and other tests to see how you are doing. Call your doctor or health care professional if you get a cold or other infection while receiving this medicine. Do not treat yourself. This medicine may decrease your body's ability to fight infection. You should make sure you get enough calcium and vitamin D while you are taking this medicine, unless your doctor tells you not to. Discuss the foods you eat and the vitamins you take with your health care professional. See your dentist regularly. Brush and floss your teeth as directed. Before you have any dental work done, tell your dentist you are receiving this medicine. What side effects may I notice from receiving this medicine? Side effects that you should report to your doctor or health care professional as soon as possible: -allergic reactions like skin rash, itching or hives, swelling of the face, lips, or tongue -breathing problems -chest pain -fast, irregular heartbeat -feeling faint or lightheaded, falls -fever, chills, or any other sign of infection -muscle spasms, tightening, or twitches -numbness   or tingling -skin blisters or bumps, or is dry, peels, or red -slow healing or unexplained pain in the mouth or jaw -unusual bleeding or bruising Side effects that  usually do not require medical attention (Report these to your doctor or health care professional if they continue or are bothersome.): -muscle pain -stomach upset, gas This list may not describe all possible side effects. Call your doctor for medical advice about side effects. You may report side effects to FDA at 1-800-FDA-1088. Where should I keep my medicine? This medicine is only given in a clinic, doctor's office, or other health care setting and will not be stored at home. NOTE: This sheet is a summary. It may not cover all possible information. If you have questions about this medicine, talk to your doctor, pharmacist, or health care provider.  2013, [employer]. (08/21/2011 3:40:41 PM)  

## 2013-08-26 NOTE — Progress Notes (Signed)
OFFICE PROGRESS NOTE  CC  Gina Hoff, MD [address] [place] [address] invasive mammary carcinoma with lobular features. Diagnosed in October 2007 Dr. Teodora Medici Dr.Daniel Clark-pearson  DIAGNOSIS: 67 year old female with diagnosis of stage II (T1 N1)  PRIOR THERAPY:  #1 patient presented with a mass in her right breast in October 2007. She underwent bilateral mammograms. The right breast ultrasound performed on 08/19/2006 showed increased density and distortion in the subareolar location of the right breast. Ultrasound showed abnormal echoes in the subareolar location which was a change measuring 1.5 cm. Patient underwent an ultrasound-guided biopsy on 08/23/2006 which showed invasive mammary carcinoma with lobular features. ER +100% PR 3% proliferation marker Ki-67 12% HER-2/neu 2+ by fish. MRI of the breasts on 08/30/2006 showed an ill-defined enhancing mass in the right breast measuring 2.6 x 4.0 x 4.1 cm. Enhancement trailed out to the nipple but did not involve the nipple. There was also a small enhancing internal mammary node measuring 7 mm suspicious for metastatic disease. Left breast was negative.  #2 patient was seen by Dr. Beatris Ship on 09/09/2006. She received neoadjuvant chemotherapy initially consisting of FEC regimen given dose dense for 4 cycles followed by Taxotere. She received her chemotherapy she received her chemotherapy from 09/20/2006 through February 2008. She then went on to lumpectomy on 02/07/2007. However there was essentially residual tumor dispersed over an area 1.8 cm with some close surgical margins at the medial area. Her case was reviewed at the breast conference and it was suggested she undergo mastectomy. Patient underwent a mastectomy on 03/19/2007.  #3 she was subsequently seen by radiation oncology for consideration of post mastectomy radiation therapy. However, she was not given radiation.  #4 she was then begun on Femara 2.5  mg daily. She has now completed 5 years of therapy. It was discontinued in June 2014.    #5 Returned to clinic on 08/05/13 for evaluation and management of new anemia.  She has underwent CT chest abdomen and pelvis which reveals diffuse lytic lesions throughout the axial and appendicular skeleton.  Bone marrow biopsy was consistent with metastatic ER/PR positive breast cancer.    CURRENT THERAPY: Arimidex daily/Xgeva  INTERVAL HISTORY: Gina [name] 67 y.o. female returns for evaluation of her anemia and new lytic lesions. Patient is feeling improved today.  She continues to eat/drink and has gained 1 lb.  She had an appointment with Vernell Leep in nutrition and found the appointment very helpful in learning about how to eat and what to eat.  Otherwise, a 10 point ROS is neg.    MEDICAL HISTORY: Past Medical History  Diagnosis Date  . Breast cancer 04/28/2013  . Myalgia and myositis, unspecified   . Anemia     ALLERGIES:  is allergic to sudafed.  MEDICATIONS:  Current Outpatient Prescriptions  Medication Sig Dispense Refill  . cetirizine (ZYRTEC) 10 MG tablet Take 10 mg by mouth daily.        . mirtazapine (REMERON) 15 MG tablet Take 1 tablet (15 mg total) by mouth at bedtime.  30 tablet  6  . montelukast (SINGULAIR) 10 MG tablet Take 10 mg by mouth at bedtime.      Marland Kitchen omeprazole (PRILOSEC) 40 MG capsule Take 40 mg by mouth daily.      . traMADol (ULTRAM) 50 MG tablet Take 0.5-1 tablets (25-50 mg total) by mouth every 6 (six) hours as needed for pain.  120 tablet  0   No current facility-administered  medications for this visit.    SURGICAL HISTORY: No past surgical history on file.  REVIEW OF SYSTEMS:  A 10 point review of systems was conducted and is otherwise negative except for what is noted above.    PHYSICAL EXAMINATION: Blood pressure 93/63, pulse 132, temperature 99.6 F (37.6 C), temperature source Oral, resp. rate 20, height 5\' 3"  (1.6 m), weight 94 lb 14.4 oz (43.046 kg). Body  mass index is 16.81 kg/(m^2). General: Patient is an ill appearing female, in wheelchair, improved from last exam HEENT: PERRLA, sclerae anicteric, conjunctival pallor, MMM Neck: supple, no palpable adenopathy Lungs: clear to auscultation bilaterally, no wheezes, rhonchi, or rales Cardiovascular: regular rate rhythm, S1, S2, no murmurs, rubs or gallops Abdomen: Soft, non-tender, non-distended, normoactive bowel sounds, no HSM Extremities: warm and well perfused, no clubbing, cyanosis, or edema Skin:  Pale, No rashes or lesions Neuro: Non-focal Breasts: deferred ECOG PERFORMANCE STATUS: 0 - Asymptomatic  LABORATORY DATA: Lab Results  Component Value Date   WBC 6.6 08/26/2013   HGB 11.1* 08/26/2013   HCT 34.2* 08/26/2013   MCV 89.3 08/26/2013   PLT 128* 08/26/2013      Chemistry      Component Value Date/Time   NA 139 08/13/2013 1039   NA 141 07/23/2013 2123   K 4.6 08/13/2013 1039   K 3.9 07/23/2013 2123   CL 106 07/23/2013 2123   CL 105 04/17/2013 1323   CO2 29 08/13/2013 1039   CO2 27 07/23/2013 2123   BUN 21.1 08/13/2013 1039   BUN 18 07/23/2013 2123   CREATININE 0.7 08/13/2013 1039   CREATININE 0.83 07/23/2013 2123      Component Value Date/Time   CALCIUM 9.7 08/13/2013 1039   CALCIUM 9.7 07/23/2013 2123   ALKPHOS 210* 08/13/2013 1039   ALKPHOS 68 04/17/2012 1314   AST 33 08/13/2013 1039   AST 21 04/17/2012 1314   ALT 16 08/13/2013 1039   ALT 16 04/17/2012 1314   BILITOT 0.32 08/13/2013 1039   BILITOT 0.5 04/17/2012 1314    REPORT OF SURGICAL PATHOLOGY  Case #: [id number] Patient Name: [name], [name] Office Chart Number: N/A  MRN: [id number] Pathologist: Havery Moros, MD DOB/Age 12-07-1945 (Age: 2) Gender: F Date Taken: 02/07/2007 Date Received: 02/07/2007  FINAL DIAGNOSIS  MICROSCOPIC EXAMINATION AND DIAGNOSIS  1. RIGHT AXILLARY SENTINEL LYMPH NODE, EXCISION: - METASTATIC CARCINOMA CONSISTENT WITH BREAST PRIMARY, SEE COMMENT.  2. RIGHT BREAST, NEEDLE LOCALIZATION  BIOPSY: - RESIDUAL CARCINOMA IN TUMOR REGRESSION AREA. - CARCINOMA IS CLOSE TO MEDIAL AND LATERAL SURGICAL MARGINS OF RESECTION. - SEE ONCOLOGY TABLE  COMMENT 1. The sections show metastatic carcinoma on H The tumor is seen in the form of dispersed atypical epithelioid cells in a fibrotic background involving an area more than 0.2 cm consistent with metastatic carcinoma. No extranodal extension is identified. For confirmation, Cytokeratin stain (AE1/AE3) was performed and is positive. The control stained appropriately.  ONCOLOGY TABLE-BREAST, INCISIONAL/EXCISIONAL BIOPSY OR MASTECTOMY WITH LYMPH NODES  1. Maximum tumor size (cm): Dispersed tumor cells in a tumor regression area measuring 1.8 cm, see comment 2. Margins: Tumor cells are seen less than 0.1 cm from the surgical margins of resection Invasive component, distance to closest margin: Less than 0.1 cm In situ component, distance to closest margin: N/A 3. Vascular/Lymphatic invasion: Yes 4. Histology, invasive component: Invasive mammary carcinoma consistent with lobular carcinoma 5. Grade, invasive component (Elston-Ellis modified Scarff-Bloom-Richardson): II Tubule formation grade: 3 Nuclear pleomorphism grade: 2 Mitotic grade: 1 6. Extensive intraductal  component (JCRT): No 7. Histology, in situ component: N/A 8. Grade, in situ component: N/A 9. Multicentric (separate tumors in different quadrants): Unknown 10. Multifocal (separate tumors in same quadrant or biopsy): No 11. Axillary lymph nodes: #examined: 1 #with metastasis: 1 ITC (isolated tumor cells, < 0.63mm): 0 Micrometastasis (> 0.62mm, < 2mm): 0 Metastasis > 2 mm: 1 Extracapsular extension: No 12. TNM Code: ypT1, pN1a, pMX 13. Breast Prognostic Markers: Case number [id number] Estrogen receptor positive (100%) Progesterone receptor positive (3%) Ki 67 (Mib-1) 12% Her 2 neu (HercepTest) 2+ Her 2 neu by FISH N/A 14. Non-neoplastic breast: Fibrosis 15.  Comments: Sections of the localized tissue show a fibrotic area measuring 1.8 cm and displaying dispersed atypical epithelioid cells throughout that area. Some of the cells surround the benign appearing ductal elements. The findings are subtle and most compatible with residual carcinoma. For confirmation cytokeratin stains were performed on several blocks representing the localized tissue and show strong positivity in previously described epithelioid cells confirming the morphologic impression of residual carcinoma. The carcinoma is less than 0.1 cm from the medial surgical margin of resection and approximately 0.1 cm from the lateral surgical margin of resection. Since the findings are subtle and the tumor is essentially composed of dispersed cells throughout, the status of the medial and lateral margins in particular may be questionable and hence truly negative margins cannot be assured. I strongly recommend clinical correlation and followup. (BNS:caf 02/12/07)  RADIOGRAPHIC STUDIES:  No results found.  ASSESSMENT: 67 year old female with  #1 history of stage II invasive mammary carcinoma with lobular features originally diagnosed in 2007. She underwent neoadjuvant chemotherapy consisting of 4 cycles of dose dense FEC followed by dose dense Taxotere for 4 cycles. She then had a lumpectomy that revealed residual disease. She proceeded on to have a mastectomy. She was not a candidate for radiation therapy even though she did have an evaluation by radiation oncology.  #2 Dr. Donnie Coffin placed her on Femara 2.5 mg daily she overall tolerated it well. She completed 5 years of Femara. She discontinued this on 04/28/2013.  #3 Anemia work up started on 08/05/13.  After CT chest abd pelvis, PET scan, and bone marrow biopsy, patient was found to have metastatic breast cancer to the bone.  She will receive Xgeva every four weeks and take Arimidex daily.    #4 Malnutrition  PLAN:   #1Patient is doing  much better today and in much better spirits now that we have a plan.  I discussed Arimidex daily and Xgeva every 4 weeks with the family.  I reviewed the medications with her in detail and printed out information in her AVS.    #2 Patient will return for weekly CBCs.  I will see her back in two weeks, and in 4 weeks prior to xgeva to make sure she continues to improve.    #3 She is up one lb this week and very encouraged.  Seh will continue with her increased PO intake.    All questions were answered. The patient knows to call the clinic with any problems, questions or concerns. We can certainly see the patient much sooner if necessary.  I spent 25 minutes counseling the patient face to face. The total time spent in the appointment was 30 minutes.   Cherie Ouch Lyn Hollingshead, NP Medical Oncology [facility] Phone: [phone] 08/26/2013, 1:22 PM

## 2013-08-27 ENCOUNTER — Encounter: Payer: Self-pay | Admitting: Oncology

## 2013-08-27 NOTE — Progress Notes (Signed)
Put fmla form on nurse's desk °

## 2013-08-28 ENCOUNTER — Encounter: Payer: Self-pay | Admitting: Oncology

## 2013-08-28 NOTE — Progress Notes (Signed)
Faxed fmla form to [place] @ [phone] °

## 2013-09-02 ENCOUNTER — Other Ambulatory Visit (HOSPITAL_BASED_OUTPATIENT_CLINIC_OR_DEPARTMENT_OTHER): Payer: 59 | Admitting: Lab

## 2013-09-02 ENCOUNTER — Encounter (INDEPENDENT_AMBULATORY_CARE_PROVIDER_SITE_OTHER): Payer: Self-pay

## 2013-09-02 DIAGNOSIS — C50219 Malignant neoplasm of upper-inner quadrant of unspecified female breast: Secondary | ICD-10-CM

## 2013-09-02 DIAGNOSIS — C50319 Malignant neoplasm of lower-inner quadrant of unspecified female breast: Secondary | ICD-10-CM

## 2013-09-02 DIAGNOSIS — C50919 Malignant neoplasm of unspecified site of unspecified female breast: Secondary | ICD-10-CM

## 2013-09-02 DIAGNOSIS — C7951 Secondary malignant neoplasm of bone: Secondary | ICD-10-CM

## 2013-09-02 LAB — CBC WITH DIFFERENTIAL/PLATELET
BASO%: 3.1 % — ABNORMAL HIGH (ref 0.0–2.0)
Eosinophils Absolute: 0.1 10*3/uL (ref 0.0–0.5)
LYMPH%: 23.6 % (ref 14.0–49.7)
MCHC: 32 g/dL (ref 31.5–36.0)
MCV: 91.4 fL (ref 79.5–101.0)
MONO#: 0.8 10*3/uL (ref 0.1–0.9)
MONO%: 11.9 % (ref 0.0–14.0)
NEUT#: 3.9 10*3/uL (ref 1.5–6.5)
RBC: 3.7 10*6/uL (ref 3.70–5.45)
RDW: 16 % — ABNORMAL HIGH (ref 11.2–14.5)
WBC: 6.4 10*3/uL (ref 3.9–10.3)
lymph#: 1.5 10*3/uL (ref 0.9–3.3)
nRBC: 9 % — ABNORMAL HIGH (ref 0–0)

## 2013-09-02 LAB — TECHNOLOGIST REVIEW

## 2013-09-09 ENCOUNTER — Encounter: Payer: Self-pay | Admitting: Adult Health

## 2013-09-09 ENCOUNTER — Other Ambulatory Visit (HOSPITAL_BASED_OUTPATIENT_CLINIC_OR_DEPARTMENT_OTHER): Payer: 59 | Admitting: Lab

## 2013-09-09 ENCOUNTER — Ambulatory Visit (HOSPITAL_BASED_OUTPATIENT_CLINIC_OR_DEPARTMENT_OTHER): Payer: 59 | Admitting: Adult Health

## 2013-09-09 VITALS — BP 104/69 | HR 112 | Temp 98.0°F | Resp 18 | Ht 63.0 in | Wt 98.8 lb

## 2013-09-09 DIAGNOSIS — C50319 Malignant neoplasm of lower-inner quadrant of unspecified female breast: Secondary | ICD-10-CM

## 2013-09-09 DIAGNOSIS — Z170 Estrogen receptor positive status [ER+]: Secondary | ICD-10-CM

## 2013-09-09 DIAGNOSIS — C50219 Malignant neoplasm of upper-inner quadrant of unspecified female breast: Secondary | ICD-10-CM

## 2013-09-09 DIAGNOSIS — C50919 Malignant neoplasm of unspecified site of unspecified female breast: Secondary | ICD-10-CM

## 2013-09-09 DIAGNOSIS — D649 Anemia, unspecified: Secondary | ICD-10-CM

## 2013-09-09 DIAGNOSIS — E46 Unspecified protein-calorie malnutrition: Secondary | ICD-10-CM

## 2013-09-09 DIAGNOSIS — C7951 Secondary malignant neoplasm of bone: Secondary | ICD-10-CM

## 2013-09-09 LAB — CBC WITH DIFFERENTIAL/PLATELET
BASO%: 3.7 % — ABNORMAL HIGH (ref 0.0–2.0)
Basophils Absolute: 0.2 10*3/uL — ABNORMAL HIGH (ref 0.0–0.1)
EOS%: 1.3 % (ref 0.0–7.0)
Eosinophils Absolute: 0.1 10*3/uL (ref 0.0–0.5)
HGB: 10.7 g/dL — ABNORMAL LOW (ref 11.6–15.9)
MCH: 28.9 pg (ref 25.1–34.0)
MCV: 90.5 fL (ref 79.5–101.0)
MONO#: 0.8 10*3/uL (ref 0.1–0.9)
MONO%: 16.3 % — ABNORMAL HIGH (ref 0.0–14.0)
RBC: 3.7 10*6/uL (ref 3.70–5.45)
RDW: 17.1 % — ABNORMAL HIGH (ref 11.2–14.5)
WBC: 4.7 10*3/uL (ref 3.9–10.3)
lymph#: 1.8 10*3/uL (ref 0.9–3.3)
nRBC: 13 % — ABNORMAL HIGH (ref 0–0)

## 2013-09-09 NOTE — Patient Instructions (Signed)
Doing well.  Continue daily Arimidex.  We will see you back in one week for labs only, and in two weeks for labs, office visit, and Xgeva.  Please call us if you have any questions or concerns.

## 2013-09-09 NOTE — Progress Notes (Signed)
OFFICE PROGRESS NOTE  CC  Gina Hoff, MD [address] [place] [address] invasive mammary carcinoma with lobular features. Diagnosed in October 2007 Dr. Teodora Medici Dr.Daniel Clark-pearson  DIAGNOSIS: 67 year old female with diagnosis of stage II (T1 N1)  PRIOR THERAPY:  #1 patient presented with a mass in her right breast in October 2007. She underwent bilateral mammograms. The right breast ultrasound performed on 08/19/2006 showed increased density and distortion in the subareolar location of the right breast. Ultrasound showed abnormal echoes in the subareolar location which was a change measuring 1.5 cm. Patient underwent an ultrasound-guided biopsy on 08/23/2006 which showed invasive mammary carcinoma with lobular features. ER +100% PR 3% proliferation marker Ki-67 12% HER-2/neu 2+ by fish. MRI of the breasts on 08/30/2006 showed an ill-defined enhancing mass in the right breast measuring 2.6 x 4.0 x 4.1 cm. Enhancement trailed out to the nipple but did not involve the nipple. There was also a small enhancing internal mammary node measuring 7 mm suspicious for metastatic disease. Left breast was negative.  #2 patient was seen by Dr. Beatris Ship on 09/09/2006. She received neoadjuvant chemotherapy initially consisting of FEC regimen given dose dense for 4 cycles followed by Taxotere. She received her chemotherapy she received her chemotherapy from 09/20/2006 through February 2008. She then went on to lumpectomy on 02/07/2007. However there was essentially residual tumor dispersed over an area 1.8 cm with some close surgical margins at the medial area. Her case was reviewed at the breast conference and it was suggested she undergo mastectomy. Patient underwent a mastectomy on 03/19/2007.  #3 she was subsequently seen by radiation oncology for consideration of post mastectomy radiation therapy. However, she was not given radiation.  #4 she was then begun on Femara 2.5  mg daily. She has now completed 5 years of therapy. It was discontinued in June 2014.    #5 Returned to clinic on 08/05/13 for evaluation and management of new anemia.  She has underwent PET/CT chest abdomen and pelvis which reveals diffuse lytic lesions throughout the axial and appendicular skeleton.  Bone marrow biopsy was consistent with metastatic ER/PR positive breast cancer.    CURRENT THERAPY: Arimidex daily/Xgeva  INTERVAL HISTORY: Gina [name] 67 y.o. female returns for evaluation following the initiation of daily arimidex and q28 day Xgeva.  She's feeling well.  She has gained wait, isn't using the Ultram nearly as much because her pain is improved. She is taking the arimidex daily and tolerating it well.  She is not feeling any further hot flashes, joint aches, or any other concerns.    MEDICAL HISTORY: Past Medical History  Diagnosis Date  . Breast cancer 04/28/2013  . Myalgia and myositis, unspecified   . Anemia     ALLERGIES:  is allergic to sudafed.  MEDICATIONS:  Current Outpatient Prescriptions  Medication Sig Dispense Refill  . anastrozole (ARIMIDEX) 1 MG tablet Take 1 tablet (1 mg total) by mouth daily.  30 tablet  3  . cetirizine (ZYRTEC) 10 MG tablet Take 10 mg by mouth daily.        . mirtazapine (REMERON) 15 MG tablet Take 1 tablet (15 mg total) by mouth at bedtime.  30 tablet  6  . omeprazole (PRILOSEC) 40 MG capsule Take 40 mg by mouth daily.      . traMADol (ULTRAM) 50 MG tablet Take 0.5-1 tablets (25-50 mg total) by mouth every 6 (six) hours as needed for pain.  120 tablet  0  No current facility-administered medications for this visit.    SURGICAL HISTORY: No past surgical history on file.  REVIEW OF SYSTEMS:  A 10 point review of systems was conducted and is otherwise negative except for what is noted above.    PHYSICAL EXAMINATION: Blood pressure 104/69, pulse 112, temperature 98 F (36.7 C), temperature source Oral, resp. rate 18, height 5\' 3"  (1.6  m), weight 98 lb 12.8 oz (44.815 kg), SpO2 98.00%. Body mass index is 17.51 kg/(m^2). General: Patient is a well appearing female, in wheelchair, improved from last exam, weight improved by 4 lbs HEENT: PERRLA, sclerae anicteric, conjunctival pallor, MMM Neck: supple, no palpable adenopathy Lungs: clear to auscultation bilaterally, no wheezes, rhonchi, or rales Cardiovascular: regular rate rhythm, S1, S2, no murmurs, rubs or gallops Abdomen: Soft, non-tender, non-distended, normoactive bowel sounds, no HSM Extremities: warm and well perfused, no clubbing, cyanosis, or edema Skin:  No rashes or lesions Neuro: Non-focal Breasts: deferred ECOG PERFORMANCE STATUS: 0 - Asymptomatic  LABORATORY DATA: Lab Results  Component Value Date   WBC 4.7 09/09/2013   HGB 10.7* 09/09/2013   HCT 33.5* 09/09/2013   MCV 90.5 09/09/2013   PLT 74* 09/09/2013      Chemistry      Component Value Date/Time   NA 135* 08/26/2013 1232   NA 141 07/23/2013 2123   K 4.4 08/26/2013 1232   K 3.9 07/23/2013 2123   CL 106 07/23/2013 2123   CL 105 04/17/2013 1323   CO2 27 08/26/2013 1232   CO2 27 07/23/2013 2123   BUN 18.6 08/26/2013 1232   BUN 18 07/23/2013 2123   CREATININE 0.7 08/26/2013 1232   CREATININE 0.83 07/23/2013 2123      Component Value Date/Time   CALCIUM 9.7 08/26/2013 1232   CALCIUM 9.7 07/23/2013 2123   ALKPHOS 241* 08/26/2013 1232   ALKPHOS 68 04/17/2012 1314   AST 26 08/26/2013 1232   AST 21 04/17/2012 1314   ALT 24 08/26/2013 1232   ALT 16 04/17/2012 1314   BILITOT 0.82 08/26/2013 1232   BILITOT 0.5 04/17/2012 1314    REPORT OF SURGICAL PATHOLOGY  Case #: [id number] Patient Name: [name], [name] Office Chart Number: N/A  MRN: [id number] Pathologist: Havery Moros, MD DOB/Age [birth year] (Age: 53) Gender: F Date Taken: 02/07/2007 Date Received: 02/07/2007  FINAL DIAGNOSIS  MICROSCOPIC EXAMINATION AND DIAGNOSIS  1. RIGHT AXILLARY SENTINEL LYMPH NODE, EXCISION: - METASTATIC CARCINOMA CONSISTENT  WITH BREAST PRIMARY, SEE COMMENT.  2. RIGHT BREAST, NEEDLE LOCALIZATION BIOPSY: - RESIDUAL CARCINOMA IN TUMOR REGRESSION AREA. - CARCINOMA IS CLOSE TO MEDIAL AND LATERAL SURGICAL MARGINS OF RESECTION. - SEE ONCOLOGY TABLE  COMMENT 1. The sections show metastatic carcinoma on H The tumor is seen in the form of dispersed atypical epithelioid cells in a fibrotic background involving an area more than 0.2 cm consistent with metastatic carcinoma. No extranodal extension is identified. For confirmation, Cytokeratin stain (AE1/AE3) was performed and is positive. The control stained appropriately.  ONCOLOGY TABLE-BREAST, INCISIONAL/EXCISIONAL BIOPSY OR MASTECTOMY WITH LYMPH NODES  1. Maximum tumor size (cm): Dispersed tumor cells in a tumor regression area measuring 1.8 cm, see comment 2. Margins: Tumor cells are seen less than 0.1 cm from the surgical margins of resection Invasive component, distance to closest margin: Less than 0.1 cm In situ component, distance to closest margin: N/A 3. Vascular/Lymphatic invasion: Yes 4. Histology, invasive component: Invasive mammary carcinoma consistent with lobular carcinoma 5. Grade, invasive component (Elston-Ellis modified Scarff-Bloom-Richardson): II Tubule formation grade: 3 Nuclear  pleomorphism grade: 2 Mitotic grade: 1 6. Extensive intraductal component (JCRT): No 7. Histology, in situ component: N/A 8. Grade, in situ component: N/A 9. Multicentric (separate tumors in different quadrants): Unknown 10. Multifocal (separate tumors in same quadrant or biopsy): No 11. Axillary lymph nodes: #examined: 1 #with metastasis: 1 ITC (isolated tumor cells, < 0.84mm): 0 Micrometastasis (> 0.85mm, < 2mm): 0 Metastasis > 2 mm: 1 Extracapsular extension: No 12. TNM Code: ypT1, pN1a, pMX 13. Breast Prognostic Markers: Case number [id number] Estrogen receptor positive (100%) Progesterone receptor positive (3%) Ki 67 (Mib-1) 12% Her 2 neu  (HercepTest) 2+ Her 2 neu by FISH N/A 14. Non-neoplastic breast: Fibrosis 15. Comments: Sections of the localized tissue show a fibrotic area measuring 1.8 cm and displaying dispersed atypical epithelioid cells throughout that area. Some of the cells surround the benign appearing ductal elements. The findings are subtle and most compatible with residual carcinoma. For confirmation cytokeratin stains were performed on several blocks representing the localized tissue and show strong positivity in previously described epithelioid cells confirming the morphologic impression of residual carcinoma. The carcinoma is less than 0.1 cm from the medial surgical margin of resection and approximately 0.1 cm from the lateral surgical margin of resection. Since the findings are subtle and the tumor is essentially composed of dispersed cells throughout, the status of the medial and lateral margins in particular may be questionable and hence truly negative margins cannot be assured. I strongly recommend clinical correlation and followup. (BNS:caf 02/12/07)  RADIOGRAPHIC STUDIES:  No results found.  ASSESSMENT: 67 year old female with  #1 history of stage II invasive mammary carcinoma with lobular features originally diagnosed in 2007. She underwent neoadjuvant chemotherapy consisting of 4 cycles of dose dense FEC followed by dose dense Taxotere for 4 cycles. She then had a lumpectomy that revealed residual disease. She proceeded on to have a mastectomy. She was not a candidate for radiation therapy even though she did have an evaluation by radiation oncology.  #2 Dr. Donnie Coffin placed her on Femara 2.5 mg daily she overall tolerated it well. She completed 5 years of Femara. She discontinued this on 04/28/2013.  #3 Anemia work up started on 08/05/13.  After CT chest abd pelvis, PET scan, and bone marrow biopsy, patient was found to have metastatic breast cancer to the bone.  She will receive Xgeva every four  weeks and take Arimidex daily.    #4 Malnutrition  PLAN:   #1  She is doing well today.  Her platelets are slightly decreased today.  We will monitor.  She will continue with daily Arimidex.    #2   Patient will return next week for labs, and in 2 weeks, for labs, evaluation, Xgeva.   #3 She is up four lbs this week and very encouraged.  She continues on Mirtazapine.  She will continue with her increased PO intake.    All questions were answered. The patient knows to call the clinic with any problems, questions or concerns. We can certainly see the patient much sooner if necessary.  I spent 25 minutes counseling the patient face to face. The total time spent in the appointment was 30 minutes.   Illa Level, NP Medical Oncology [facility] [phone]   09/10/2013, 6:02 AM

## 2013-09-16 ENCOUNTER — Other Ambulatory Visit (HOSPITAL_BASED_OUTPATIENT_CLINIC_OR_DEPARTMENT_OTHER): Payer: 59 | Admitting: Lab

## 2013-09-16 DIAGNOSIS — C50919 Malignant neoplasm of unspecified site of unspecified female breast: Secondary | ICD-10-CM

## 2013-09-16 DIAGNOSIS — C50219 Malignant neoplasm of upper-inner quadrant of unspecified female breast: Secondary | ICD-10-CM

## 2013-09-16 DIAGNOSIS — D649 Anemia, unspecified: Secondary | ICD-10-CM

## 2013-09-16 LAB — MANUAL DIFFERENTIAL
ALC: 1.6 10*3/uL (ref 0.9–3.3)
ANC (CHCC manual diff): 2 10*3/uL (ref 1.5–6.5)
Band Neutrophils: 5 % (ref 0–10)
Blasts: 2 % — ABNORMAL HIGH (ref 0–0)
EOS: 3 % (ref 0–7)
MONO: 13 % (ref 0–14)
Metamyelocytes: 6 % — ABNORMAL HIGH (ref 0–0)
Other Cell: 0 % (ref 0–0)
PROMYELO: 1 % — ABNORMAL HIGH (ref 0–0)
SEG: 23 % — ABNORMAL LOW (ref 38–77)
Variant Lymph: 0 % (ref 0–0)
nRBC: 15 % — ABNORMAL HIGH (ref 0–0)

## 2013-09-16 LAB — CBC WITH DIFFERENTIAL/PLATELET
HGB: 10.6 g/dL — ABNORMAL LOW (ref 11.6–15.9)
MCV: 92.6 fL (ref 79.5–101.0)
Platelets: 33 10*3/uL — ABNORMAL LOW (ref 145–400)
RBC: 3.67 10*6/uL — ABNORMAL LOW (ref 3.70–5.45)
RDW: 18.3 % — ABNORMAL HIGH (ref 11.2–14.5)
WBC: 4.8 10*3/uL (ref 3.9–10.3)

## 2013-09-23 ENCOUNTER — Telehealth: Payer: Self-pay | Admitting: Adult Health

## 2013-09-23 ENCOUNTER — Ambulatory Visit (HOSPITAL_BASED_OUTPATIENT_CLINIC_OR_DEPARTMENT_OTHER): Payer: 59 | Admitting: Adult Health

## 2013-09-23 ENCOUNTER — Encounter: Payer: Self-pay | Admitting: Adult Health

## 2013-09-23 ENCOUNTER — Telehealth: Payer: Self-pay | Admitting: Oncology

## 2013-09-23 ENCOUNTER — Ambulatory Visit (HOSPITAL_BASED_OUTPATIENT_CLINIC_OR_DEPARTMENT_OTHER): Payer: 59

## 2013-09-23 ENCOUNTER — Other Ambulatory Visit (HOSPITAL_BASED_OUTPATIENT_CLINIC_OR_DEPARTMENT_OTHER): Payer: 59 | Admitting: Lab

## 2013-09-23 VITALS — BP 118/72 | HR 112 | Temp 97.5°F | Resp 18 | Ht 63.0 in | Wt 102.3 lb

## 2013-09-23 DIAGNOSIS — C50919 Malignant neoplasm of unspecified site of unspecified female breast: Secondary | ICD-10-CM

## 2013-09-23 DIAGNOSIS — C7951 Secondary malignant neoplasm of bone: Secondary | ICD-10-CM

## 2013-09-23 DIAGNOSIS — D649 Anemia, unspecified: Secondary | ICD-10-CM

## 2013-09-23 DIAGNOSIS — Z170 Estrogen receptor positive status [ER+]: Secondary | ICD-10-CM

## 2013-09-23 DIAGNOSIS — M549 Dorsalgia, unspecified: Secondary | ICD-10-CM

## 2013-09-23 LAB — COMPREHENSIVE METABOLIC PANEL (CC13)
Albumin: 3.1 g/dL — ABNORMAL LOW (ref 3.5–5.0)
Alkaline Phosphatase: 231 U/L — ABNORMAL HIGH (ref 40–150)
Anion Gap: 7 mEq/L (ref 3–11)
BUN: 21.4 mg/dL (ref 7.0–26.0)
Creatinine: 0.7 mg/dL (ref 0.6–1.1)
Glucose: 134 mg/dl (ref 70–140)
Sodium: 141 mEq/L (ref 136–145)
Total Bilirubin: 0.43 mg/dL (ref 0.20–1.20)
Total Protein: 7.3 g/dL (ref 6.4–8.3)

## 2013-09-23 LAB — MANUAL DIFFERENTIAL
ALC: 1.3 10*3/uL (ref 0.9–3.3)
ANC (CHCC manual diff): 2.1 10*3/uL (ref 1.5–6.5)
Band Neutrophils: 10 % (ref 0–10)
Blasts: 1 % — ABNORMAL HIGH (ref 0–0)
LYMPH: 28 % (ref 14–49)
Other Cell: 0 % (ref 0–0)
PROMYELO: 2 % — ABNORMAL HIGH (ref 0–0)
SEG: 27 % — ABNORMAL LOW (ref 38–77)
Variant Lymph: 0 % (ref 0–0)
nRBC: 22 % — ABNORMAL HIGH (ref 0–0)

## 2013-09-23 LAB — CBC WITH DIFFERENTIAL/PLATELET
HCT: 32 % — ABNORMAL LOW (ref 34.8–46.6)
MCH: 29 pg (ref 25.1–34.0)
MCHC: 31.3 g/dL — ABNORMAL LOW (ref 31.5–36.0)
MCV: 92.8 fL (ref 79.5–101.0)
Platelets: 21 10*3/uL — ABNORMAL LOW (ref 145–400)

## 2013-09-23 MED ORDER — DENOSUMAB 120 MG/1.7ML ~~LOC~~ SOLN
120.0000 mg | Freq: Once | SUBCUTANEOUS | Status: AC
  Administered 2013-09-23: 120 mg via SUBCUTANEOUS
  Filled 2013-09-23: qty 1.7

## 2013-09-23 NOTE — Progress Notes (Addendum)
OFFICE PROGRESS NOTE  CC  [name], MD [address] [place] [address] invasive mammary carcinoma with lobular features. Diagnosed in October 2007 Dr. Teodora Medici Dr.Daniel Clark-pearson  DIAGNOSIS: 67 year old female with diagnosis of stage II (T1 N1)  PRIOR THERAPY:  #1 patient presented with a mass in her right breast in October 2007. She underwent bilateral mammograms. The right breast ultrasound performed on 08/19/2006 showed increased density and distortion in the subareolar location of the right breast. Ultrasound showed abnormal echoes in the subareolar location which was a change measuring 1.5 cm. Patient underwent an ultrasound-guided biopsy on 08/23/2006 which showed invasive mammary carcinoma with lobular features. ER +100% PR 3% proliferation marker Ki-67 12% HER-2/neu 2+ by fish. MRI of the breasts on 08/30/2006 showed an ill-defined enhancing mass in the right breast measuring 2.6 x 4.0 x 4.1 cm. Enhancement trailed out to the nipple but did not involve the nipple. There was also a small enhancing internal mammary node measuring 7 mm suspicious for metastatic disease. Left breast was negative.  #2 patient was seen by Dr. Beatris Ship on 09/09/2006. She received neoadjuvant chemotherapy initially consisting of FEC regimen given dose dense for 4 cycles followed by Taxotere. She received her chemotherapy she received her chemotherapy from 09/20/2006 through February 2008. She then went on to lumpectomy on 02/07/2007. However there was essentially residual tumor dispersed over an area 1.8 cm with some close surgical margins at the medial area. Her case was reviewed at the breast conference and it was suggested she undergo mastectomy. Patient underwent a mastectomy on 03/19/2007.  #3 she was subsequently seen by radiation oncology for consideration of post mastectomy radiation therapy. However, she was not given radiation.  #4 she was then begun on Femara 2.5  mg daily. She has now completed 5 years of therapy. It was discontinued in June 2014.    #5 Returned to clinic on 08/05/13 for evaluation and management of new anemia.  She has underwent PET/CT chest abdomen and pelvis which reveals diffuse lytic lesions throughout the axial and appendicular skeleton.  Bone marrow biopsy was consistent with metastatic ER/PR positive breast cancer.    CURRENT THERAPY: Arimidex daily/Xgeva  INTERVAL HISTORY: Gina [name] 67 y.o. female returns for evaluation.  She is doing much better.  Her weight, appetite, pain, and fatigue have all improved.  She denies fevers, chills, night sweats, or any other concerns.  The family is concerned about her platelet count dropping, but otherwise, a 10 point ROS is neg.     MEDICAL HISTORY: Past Medical History  Diagnosis Date  . Breast cancer 04/28/2013  . Myalgia and myositis, unspecified   . Anemia     ALLERGIES:  is allergic to sudafed.  MEDICATIONS:  Current Outpatient Prescriptions  Medication Sig Dispense Refill  . anastrozole (ARIMIDEX) 1 MG tablet Take 1 tablet (1 mg total) by mouth daily.  30 tablet  3  . cetirizine (ZYRTEC) 10 MG tablet Take 10 mg by mouth daily.        . mirtazapine (REMERON) 15 MG tablet Take 1 tablet (15 mg total) by mouth at bedtime.  30 tablet  6  . omeprazole (PRILOSEC) 40 MG capsule Take 40 mg by mouth daily.      . traMADol (ULTRAM) 50 MG tablet Take 0.5-1 tablets (25-50 mg total) by mouth every 6 (six) hours as needed for pain.  120 tablet  0   No current facility-administered medications for this visit.  SURGICAL HISTORY: No past surgical history on file.  REVIEW OF SYSTEMS:  A 10 point review of systems was conducted and is otherwise negative except for what is noted above.    PHYSICAL EXAMINATION: Blood pressure 118/72, pulse 112, temperature 97.5 F (36.4 C), temperature source Oral, resp. rate 18, height 5\' 3"  (1.6 m), weight 102 lb 4.8 oz (46.403 kg), SpO2 97.00%. Body  mass index is 18.13 kg/(m^2). General: Patient is a well appearing female, in wheelchair, improved from last exam, weight improved by 4 lbs HEENT: PERRLA, sclerae anicteric, conjunctival pallor, MMM Neck: supple, no palpable adenopathy Lungs: clear to auscultation bilaterally, no wheezes, rhonchi, or rales Cardiovascular: regular rate rhythm, S1, S2, no murmurs, rubs or gallops Abdomen: Soft, non-tender, non-distended, normoactive bowel sounds, no HSM Extremities: warm and well perfused, no clubbing, cyanosis, or edema Skin:  No rashes or lesions Neuro: Non-focal Breasts: deferred ECOG PERFORMANCE STATUS: 0 - Asymptomatic  LABORATORY DATA: Lab Results  Component Value Date   WBC 4.8 09/23/2013   HGB 10.0* 09/23/2013   HCT 32.0* 09/23/2013   MCV 92.8 09/23/2013   PLT 21* 09/23/2013      Chemistry      Component Value Date/Time   NA 141 09/23/2013 1103   NA 141 07/23/2013 2123   K 4.4 09/23/2013 1103   K 3.9 07/23/2013 2123   CL 106 07/23/2013 2123   CL 105 04/17/2013 1323   CO2 23 09/23/2013 1103   CO2 27 07/23/2013 2123   BUN 21.4 09/23/2013 1103   BUN 18 07/23/2013 2123   CREATININE 0.7 09/23/2013 1103   CREATININE 0.83 07/23/2013 2123      Component Value Date/Time   CALCIUM 9.0 09/23/2013 1103   CALCIUM 9.7 07/23/2013 2123   ALKPHOS 231* 09/23/2013 1103   ALKPHOS 68 04/17/2012 1314   AST 29 09/23/2013 1103   AST 21 04/17/2012 1314   ALT 19 09/23/2013 1103   ALT 16 04/17/2012 1314   BILITOT 0.43 09/23/2013 1103   BILITOT 0.5 04/17/2012 1314    REPORT OF SURGICAL PATHOLOGY  Case #: [id number] Patient Name: Gina [name], [name] Office Chart Number: N/A  MRN: [id number] Pathologist: Havery Moros, MD DOB/Age 22-Apr-1946 (Age: 75) Gender: F Date Taken: 02/07/2007 Date Received: 02/07/2007  FINAL DIAGNOSIS  MICROSCOPIC EXAMINATION AND DIAGNOSIS  1. RIGHT AXILLARY SENTINEL LYMPH NODE, EXCISION: - METASTATIC CARCINOMA CONSISTENT WITH BREAST PRIMARY, SEE COMMENT.  2. RIGHT  BREAST, NEEDLE LOCALIZATION BIOPSY: - RESIDUAL CARCINOMA IN TUMOR REGRESSION AREA. - CARCINOMA IS CLOSE TO MEDIAL AND LATERAL SURGICAL MARGINS OF RESECTION. - SEE ONCOLOGY TABLE  COMMENT 1. The sections show metastatic carcinoma on H The tumor is seen in the form of dispersed atypical epithelioid cells in a fibrotic background involving an area more than 0.2 cm consistent with metastatic carcinoma. No extranodal extension is identified. For confirmation, Cytokeratin stain (AE1/AE3) was performed and is positive. The control stained appropriately.  ONCOLOGY TABLE-BREAST, INCISIONAL/EXCISIONAL BIOPSY OR MASTECTOMY WITH LYMPH NODES  1. Maximum tumor size (cm): Dispersed tumor cells in a tumor regression area measuring 1.8 cm, see comment 2. Margins: Tumor cells are seen less than 0.1 cm from the surgical margins of resection Invasive component, distance to closest margin: Less than 0.1 cm In situ component, distance to closest margin: N/A 3. Vascular/Lymphatic invasion: Yes 4. Histology, invasive component: Invasive mammary carcinoma consistent with lobular carcinoma 5. Grade, invasive component (Elston-Ellis modified Scarff-Bloom-Richardson): II Tubule formation grade: 3 Nuclear pleomorphism grade: 2 Mitotic grade: 1 6. Extensive intraductal component (  JCRT): No 7. Histology, in situ component: N/A 8. Grade, in situ component: N/A 9. Multicentric (separate tumors in different quadrants): Unknown 10. Multifocal (separate tumors in same quadrant or biopsy): No 11. Axillary lymph nodes: #examined: 1 #with metastasis: 1 ITC (isolated tumor cells, < 0.71mm): 0 Micrometastasis (> 0.46mm, < 2mm): 0 Metastasis > 2 mm: 1 Extracapsular extension: No 12. TNM Code: ypT1, pN1a, pMX 13. Breast Prognostic Markers: Case number [id number] Estrogen receptor positive (100%) Progesterone receptor positive (3%) Ki 67 (Mib-1) 12% Her 2 neu (HercepTest) 2+ Her 2 neu by FISH N/A 14.  Non-neoplastic breast: Fibrosis 15. Comments: Sections of the localized tissue show a fibrotic area measuring 1.8 cm and displaying dispersed atypical epithelioid cells throughout that area. Some of the cells surround the benign appearing ductal elements. The findings are subtle and most compatible with residual carcinoma. For confirmation cytokeratin stains were performed on several blocks representing the localized tissue and show strong positivity in previously described epithelioid cells confirming the morphologic impression of residual carcinoma. The carcinoma is less than 0.1 cm from the medial surgical margin of resection and approximately 0.1 cm from the lateral surgical margin of resection. Since the findings are subtle and the tumor is essentially composed of dispersed cells throughout, the status of the medial and lateral margins in particular may be questionable and hence truly negative margins cannot be assured. I strongly recommend clinical correlation and followup. (BNS:caf 02/12/07)  RADIOGRAPHIC STUDIES:  No results found.  ASSESSMENT: 67 year old female with  #1 history of stage II invasive mammary carcinoma with lobular features originally diagnosed in 2007. She underwent neoadjuvant chemotherapy consisting of 4 cycles of dose dense FEC followed by dose dense Taxotere for 4 cycles. She then had a lumpectomy that revealed residual disease. She proceeded on to have a mastectomy. She was not a candidate for radiation therapy even though she did have an evaluation by radiation oncology.  #2 Dr. Donnie Coffin placed her on Femara 2.5 mg daily she overall tolerated it well. She completed 5 years of Femara. She discontinued this on 04/28/2013.  #3 Anemia work up started on 08/05/13.  After CT chest abd pelvis, PET scan, and bone marrow biopsy, patient was found to have metastatic breast cancer to the bone.  She will receive Xgeva every four weeks and take Arimidex daily.    #4  Malnutrition  PLAN:   #1  She is doing well today.  Her platelets are down to the 20s.  I discussed this with the family.  She also has blasts in her peripheral blood.  This was discussed with Dr. Welton Flakes and she would like peripheral flow ordered.  I explained that to the patient.    She will continue with Rivka Barbara, and Arimidex daily while work up is underway.  #2   Patient will return in two weeks for evaluation.   #3 She continues on Mirtazapine.  She will continue with her increased PO intake.    All questions were answered. The patient knows to call the clinic with any problems, questions or concerns. We can certainly see the patient much sooner if necessary.  I spent 25 minutes counseling the patient face to face. The total time spent in the appointment was 30 minutes.   Illa Level, NP Medical Oncology [facility] [phone]   09/25/2013, 6:05 AM  ATTENDING'S ATTESTATION:  I personally reviewed patient's chart, examined patient myself, formulated the treatment plan as followed.    Ms. Segler looks remarkably well although her  platelets are low down in the 20s. This is very concerning. Patient also has a few blasts on her peripheral smear again very concerning. It is unclear whether this is just overactive bone marrow versus a conversion to an acute leukemia. We did discuss doing a peripheral blood flow cytometry. However afterwards I was told by the lab that we cannot perform and because there are inadequate number of blasts. We therefore will continue to follow her. In the meantime she'll continue the Arimidex as well as taking the Xgeva. We will continue to follow her very closely.  Patient has been did discuss with me the possibility of undergoing 2 [facility] [place] in [place] for a second opinion. I am very appreciative of him letting me know this. We will do everything we can to get her records there if the need them.  Drue Second,  MD Medical/Oncology [facility] [phone] (beeper) [phone] (Office)  10/01/2013, 6:28 PM

## 2013-09-23 NOTE — Telephone Encounter (Signed)
, °

## 2013-09-23 NOTE — Patient Instructions (Signed)
Denosumab injection What is this medicine? DENOSUMAB slows bone breakdown. It is used to treat osteoporosis in women after menopause and in men. This medicine is also used to prevent bone fractures and other bone problems caused by cancer bone metastases. This medicine may be used for other purposes; ask your health care provider or pharmacist if you have questions. What should I tell my health care provider before I take this medicine? They need to know if you have any of these conditions: -dental disease -eczema -infection or history of infections -kidney disease or on dialysis -low blood calcium or vitamin D -malabsorption syndrome -scheduled to have surgery or tooth extraction -taking medicine that contains denosumab -thyroid or parathyroid disease -an unusual reaction to denosumab, other medicines, foods, dyes, or preservatives -pregnant or trying to get pregnant -breast-feeding How should I use this medicine? This medicine is for injection under the skin. It is given by a health care professional in a hospital or clinic setting. If you are getting Prolia, a special MedGuide will be given to you by the pharmacist with each prescription and refill. Be sure to read this information carefully each time. Talk to your pediatrician regarding the use of this medicine in children. Special care may be needed. Overdosage: If you think you've taken too much of this medicine contact a poison control center or emergency room at once. Overdosage: If you think you have taken too much of this medicine contact a poison control center or emergency room at once. NOTE: This medicine is only for you. Do not share this medicine with others. What if I miss a dose? It is important not to miss your dose. Call your doctor or health care professional if you are unable to keep an appointment. What may interact with this medicine? Do not take this medicine with any of the following medications: -other medicines  containing denosumab This medicine may also interact with the following medications: -medicines that suppress the immune system -medicines that treat cancer -steroid medicines like prednisone or cortisone This list may not describe all possible interactions. Give your health care provider a list of all the medicines, herbs, non-prescription drugs, or dietary supplements you use. Also tell them if you smoke, drink alcohol, or use illegal drugs. Some items may interact with your medicine. What should I watch for while using this medicine? Visit your doctor or health care professional for regular checks on your progress. Your doctor or health care professional may order blood tests and other tests to see how you are doing. Call your doctor or health care professional if you get a cold or other infection while receiving this medicine. Do not treat yourself. This medicine may decrease your body's ability to fight infection. You should make sure you get enough calcium and vitamin D while you are taking this medicine, unless your doctor tells you not to. Discuss the foods you eat and the vitamins you take with your health care professional. See your dentist regularly. Brush and floss your teeth as directed. Before you have any dental work done, tell your dentist you are receiving this medicine. What side effects may I notice from receiving this medicine? Side effects that you should report to your doctor or health care professional as soon as possible: -allergic reactions like skin rash, itching or hives, swelling of the face, lips, or tongue -breathing problems -chest pain -fast, irregular heartbeat -feeling faint or lightheaded, falls -fever, chills, or any other sign of infection -muscle spasms, tightening, or twitches -numbness  or tingling -skin blisters or bumps, or is dry, peels, or red -slow healing or unexplained pain in the mouth or jaw -unusual bleeding or bruising Side effects that  usually do not require medical attention (Report these to your doctor or health care professional if they continue or are bothersome.): -muscle pain -stomach upset, gas This list may not describe all possible side effects. Call your doctor for medical advice about side effects. You may report side effects to FDA at 1-800-FDA-1088. Where should I keep my medicine? This medicine is only given in a clinic, doctor's office, or other health care setting and will not be stored at home. NOTE: This sheet is a summary. It may not cover all possible information. If you have questions about this medicine, talk to your doctor, pharmacist, or health care provider.  2013, [employer]vier/[employer]. (08/21/2011 3:40:41 PM)

## 2013-09-29 ENCOUNTER — Other Ambulatory Visit: Payer: Self-pay | Admitting: Emergency Medicine

## 2013-09-29 DIAGNOSIS — C50919 Malignant neoplasm of unspecified site of unspecified female breast: Secondary | ICD-10-CM

## 2013-09-30 ENCOUNTER — Other Ambulatory Visit (HOSPITAL_BASED_OUTPATIENT_CLINIC_OR_DEPARTMENT_OTHER): Payer: 59 | Admitting: Lab

## 2013-09-30 ENCOUNTER — Other Ambulatory Visit: Payer: Self-pay | Admitting: Emergency Medicine

## 2013-09-30 DIAGNOSIS — C50919 Malignant neoplasm of unspecified site of unspecified female breast: Secondary | ICD-10-CM

## 2013-09-30 LAB — COMPREHENSIVE METABOLIC PANEL (CC13)
Albumin: 3.1 g/dL — ABNORMAL LOW (ref 3.5–5.0)
Alkaline Phosphatase: 238 U/L — ABNORMAL HIGH (ref 40–150)
Anion Gap: 10 mEq/L (ref 3–11)
CO2: 21 mEq/L — ABNORMAL LOW (ref 22–29)
Creatinine: 0.6 mg/dL (ref 0.6–1.1)
Glucose: 135 mg/dl (ref 70–140)
Potassium: 4.2 mEq/L (ref 3.5–5.1)
Sodium: 138 mEq/L (ref 136–145)
Total Bilirubin: 0.58 mg/dL (ref 0.20–1.20)
Total Protein: 7.4 g/dL (ref 6.4–8.3)

## 2013-09-30 LAB — MANUAL DIFFERENTIAL
ALC: 2.1 10*3/uL (ref 0.9–3.3)
ANC (CHCC manual diff): 2.7 10*3/uL (ref 1.5–6.5)
Band Neutrophils: 0 % (ref 0–10)
Basophil: 3 % — ABNORMAL HIGH (ref 0–2)
Blasts: 1 % — ABNORMAL HIGH (ref 0–0)
LYMPH: 34 % (ref 14–49)
Other Cell: 0 % (ref 0–0)
PLT EST: DECREASED
PROMYELO: 1 % — ABNORMAL HIGH (ref 0–0)
SEG: 36 % — ABNORMAL LOW (ref 38–77)
Variant Lymph: 0 % (ref 0–0)
nRBC: 21 % — ABNORMAL HIGH (ref 0–0)

## 2013-09-30 LAB — CBC WITH DIFFERENTIAL/PLATELET
HCT: 31.7 % — ABNORMAL LOW (ref 34.8–46.6)
MCH: 29.4 pg (ref 25.1–34.0)
MCHC: 31.5 g/dL (ref 31.5–36.0)
MCV: 93.2 fL (ref 79.5–101.0)
Platelets: 23 10*3/uL — ABNORMAL LOW (ref 145–400)
WBC: 6.2 10*3/uL (ref 3.9–10.3)

## 2013-10-03 ENCOUNTER — Ambulatory Visit
Admission: RE | Admit: 2013-10-03 | Discharge: 2013-10-03 | Disposition: A | Discharge status: Home / Self Care | Payer: 59 | Source: Ambulatory Visit | Attending: Adult Health | Admitting: Adult Health

## 2013-10-03 ENCOUNTER — Other Ambulatory Visit: Payer: Self-pay | Admitting: Adult Health

## 2013-10-03 DIAGNOSIS — M549 Dorsalgia, unspecified: Secondary | ICD-10-CM

## 2013-10-05 ENCOUNTER — Telehealth: Payer: Self-pay | Admitting: Oncology

## 2013-10-05 ENCOUNTER — Encounter: Payer: Self-pay | Admitting: *Deleted

## 2013-10-05 NOTE — Telephone Encounter (Signed)
, °

## 2013-10-05 NOTE — Progress Notes (Signed)
Received records and took to Dorothy so she would have for her visit on 11/19.

## 2013-10-07 ENCOUNTER — Other Ambulatory Visit: Payer: 59 | Admitting: Lab

## 2013-10-07 ENCOUNTER — Ambulatory Visit: Payer: 59 | Admitting: Adult Health

## 2013-10-12 ENCOUNTER — Telehealth: Payer: Self-pay | Admitting: Oncology

## 2013-10-14 ENCOUNTER — Ambulatory Visit: Payer: 59 | Admitting: Adult Health

## 2013-10-14 ENCOUNTER — Other Ambulatory Visit: Payer: 59 | Admitting: Lab

## 2013-10-15 ENCOUNTER — Telehealth: Payer: Self-pay | Admitting: Oncology

## 2013-10-15 NOTE — Telephone Encounter (Signed)
Faxed pt medical records to [facility] in [place]

## 2013-10-29 ENCOUNTER — Other Ambulatory Visit: Payer: Self-pay | Admitting: Emergency Medicine

## 2013-10-29 DIAGNOSIS — C50919 Malignant neoplasm of unspecified site of unspecified female breast: Secondary | ICD-10-CM

## 2013-10-30 ENCOUNTER — Telehealth (HOSPITAL_COMMUNITY): Payer: Self-pay | Admitting: Radiology

## 2013-11-02 ENCOUNTER — Emergency Department (HOSPITAL_COMMUNITY): Payer: 59

## 2013-11-02 ENCOUNTER — Encounter (HOSPITAL_COMMUNITY): Payer: Self-pay | Admitting: Emergency Medicine

## 2013-11-02 ENCOUNTER — Observation Stay (HOSPITAL_COMMUNITY)
Admission: EM | Admit: 2013-11-02 | Discharge: 2013-11-04 | Disposition: A | Discharge status: Home / Self Care | Payer: 59 | Attending: Internal Medicine | Admitting: Internal Medicine

## 2013-11-02 DIAGNOSIS — R42 Dizziness and giddiness: Principal | ICD-10-CM | POA: Insufficient documentation

## 2013-11-02 DIAGNOSIS — Z79899 Other long term (current) drug therapy: Secondary | ICD-10-CM | POA: Insufficient documentation

## 2013-11-02 DIAGNOSIS — R110 Nausea: Secondary | ICD-10-CM | POA: Insufficient documentation

## 2013-11-02 DIAGNOSIS — C50919 Malignant neoplasm of unspecified site of unspecified female breast: Secondary | ICD-10-CM | POA: Diagnosis present

## 2013-11-02 DIAGNOSIS — R270 Ataxia, unspecified: Secondary | ICD-10-CM | POA: Diagnosis present

## 2013-11-02 DIAGNOSIS — IMO0001 Reserved for inherently not codable concepts without codable children: Secondary | ICD-10-CM | POA: Insufficient documentation

## 2013-11-02 DIAGNOSIS — R5381 Other malaise: Secondary | ICD-10-CM | POA: Insufficient documentation

## 2013-11-02 DIAGNOSIS — Z853 Personal history of malignant neoplasm of breast: Secondary | ICD-10-CM | POA: Insufficient documentation

## 2013-11-02 DIAGNOSIS — D696 Thrombocytopenia, unspecified: Secondary | ICD-10-CM

## 2013-11-02 DIAGNOSIS — D649 Anemia, unspecified: Secondary | ICD-10-CM | POA: Insufficient documentation

## 2013-11-02 LAB — COMPREHENSIVE METABOLIC PANEL
Albumin: 3.5 g/dL (ref 3.5–5.2)
Alkaline Phosphatase: 238 U/L — ABNORMAL HIGH (ref 39–117)
BUN: 25 mg/dL — ABNORMAL HIGH (ref 6–23)
Chloride: 101 mEq/L (ref 96–112)
Creatinine, Ser: 0.46 mg/dL — ABNORMAL LOW (ref 0.50–1.10)
GFR calc non Af Amer: >90 mL/min (ref >90)
Total Bilirubin: 0.3 mg/dL (ref 0.3–1.2)
Total Protein: 7.4 g/dL (ref 6.0–8.3)

## 2013-11-02 MED ORDER — LORAZEPAM 2 MG/ML IJ SOLN
1.0000 mg | Freq: Once | INTRAMUSCULAR | Status: AC
  Administered 2013-11-03: 1 mg via INTRAVENOUS
  Filled 2013-11-02: qty 1

## 2013-11-02 NOTE — ED Provider Notes (Signed)
CSN: [id number]     Arrival date & time 11/02/13  1935 History   First MD Initiated Contact with Patient 11/02/13 2111     Chief Complaint  Patient presents with  . Dizziness  . Weakness   (Consider location/radiation/quality/duration/timing/severity/associated sxs/prior Treatment) The history is provided by the patient, medical records, the spouse and a relative. No language interpreter was used.   This is a 67 year old female who is newly diagnosed recurrent breast cancer who presents emergency Department with a chief complaint of new onset vertigo. Patient states about 4 PM this afternoon she stood up from the couch with sudden onset of vertiginous symptoms. His associated nausea without vomiting. Patient has never had this before.Denies unilateral weakness, facial asymmetry, difficulty with speech or swallowing. Denies HA.   Past Medical History  Diagnosis Date  . Breast cancer 04/28/2013  . Myalgia and myositis, unspecified   . Anemia    History reviewed. No pertinent past surgical history. History reviewed. No pertinent family history. History  Substance Use Topics  . Smoking status: Never Smoker   . Smokeless tobacco: Never Used  . Alcohol Use: 0.6 oz/week    1 Glasses of wine per week   OB History   Grav Para Term Preterm Abortions TAB SAB Ect Mult Living                 Review of Systems  Allergies  Sudafed  Home Medications   Current Outpatient Rx  Name  Route  Sig  Dispense  Refill  . anastrozole (ARIMIDEX) 1 MG tablet   Oral   Take 1 tablet (1 mg total) by mouth daily.   30 tablet   3   . cetirizine (ZYRTEC) 10 MG tablet   Oral   Take 10 mg by mouth daily.           . Cholecalciferol (VITAMIN D3) 5000 UNITS TABS   Oral   Take 5,000 mg by mouth daily.         Marland Kitchen dronabinol (MARINOL) 2.5 MG capsule   Oral   Take 2.5 mg by mouth 2 (two) times daily before a meal.         . HYDROcodone-acetaminophen (NORCO/VICODIN) 5-325 MG per tablet   Oral  Take 1 tablet by mouth every 6 (six) hours as needed for moderate pain.         Carlene Cor[place] Mushroom POWD   Does not apply   5 mLs by Does not apply route daily.         . mirtazapine (REMERON) 15 MG tablet   Oral   Take 7.5 mg by mouth at bedtime.         Marland Kitchen omeprazole (PRILOSEC) 40 MG capsule   Oral   Take 40 mg by mouth daily.         . traMADol (ULTRAM) 50 MG tablet   Oral   Take 0.5-1 tablets (25-50 mg total) by mouth every 6 (six) hours as needed for pain.   120 tablet   0    BP 121/52  Pulse 97  Temp(Src) 98.4 F (36.9 C) (Oral)  Resp 18  Ht 5\' 3"  (1.6 m)  Wt 101 lb (45.813 kg)  BMI 17.90 kg/m2  SpO2 96% Physical Exam Physical Exam  Nursing note and vitals reviewed. Constitutional: She is oriented to person, place, and time. She appears well-developed and well-nourished. No distress.  HENT:  Head: Normocephalic and atraumatic.  Eyes: Conjunctivae normal and EOM are normal. Pupils are  equal, round, and reactive to light. No scleral icterus.  Neck: Normal range of motion.  Cardiovascular: Normal rate, regular rhythm and normal heart sounds.  Exam reveals no gallop and no friction rub.   No murmur heard. Pulmonary/Chest: Effort normal and breath sounds normal. No respiratory distress.  Abdominal: Soft. Bowel sounds are normal. She exhibits no distension and no mass. There is no tenderness. There is no guarding.  Neurological: She is alert and oriented to person, place, and time.  Speech is clear and goal oriented, follows commands Major Cranial nerves without deficit, no facial droop. No nystagmus Normal strength in upper and lower extremities bilaterally including dorsiflexion and plantar flexion, strong and equal grip strength Sensation normal to light and sharp touch Moves extremities without ataxia, coordination intact Normal finger to nose and rapid alternating movements Normal Heel to [place]. Neg romberg, no pronator drift Balance is dis coordinated.  satinet is ataxic and has to hold on to something to walk  Skin: Skin is warm and dry. She is not diaphoretic.    ED Course  Procedures (including critical care time) Labs Review Labs Reviewed  CBC WITH DIFFERENTIAL - Abnormal; Notable for the following:    RBC 3.37 (*)    Hemoglobin 10.1 (*)    HCT 30.9 (*)    RDW 17.3 (*)    All other components within normal limits  COMPREHENSIVE METABOLIC PANEL - Abnormal; Notable for the following:    Glucose, Bld 156 (*)    BUN 25 (*)    Creatinine, Ser 0.46 (*)    Alkaline Phosphatase 238 (*)    All other components within normal limits   Imaging Review Ct Head Wo Contrast  11/02/2013   CLINICAL DATA:  Dizziness and nauseated after a nap today. History of breast cancer.  EXAM: CT HEAD WITHOUT CONTRAST  TECHNIQUE: Contiguous axial images were obtained from the base of the skull through the vertex without intravenous contrast.  COMPARISON:  None.  FINDINGS: There is no intra or extra-axial fluid collection or mass lesion. The basilar cisterns and ventricles have a normal appearance. There is no CT evidence for acute infarction or hemorrhage. Bone windows are unremarkable.  IMPRESSION: No evidence for acute intracranial abnormality.   Electronically Signed   By: Rosalie Gums M.D.   On: 11/02/2013 23:55    EKG Interpretation   None       MDM   1. Vertigo    1:45 AM BP 122/69  Pulse 95  Temp(Src) 98.4 F (36.9 C) (Oral)  Resp 18  Ht 5\' 3"  (1.6 m)  Wt 101 lb (45.813 kg)  BMI 17.90 kg/m2  SpO2 96% Patient head ct negative for bleeds or mets. Ativan and meclizine given.  paitient states she is feeling better but continues to be ataxic. Will admit for MRI.    Arthor Captain, PA-C 11/04/13 1017

## 2013-11-02 NOTE — ED Notes (Signed)
Was only able to get light green top for pts labs. Called lab and made them aware.

## 2013-11-02 NOTE — ED Notes (Signed)
Pt. reports generalized weakness and dizziness this evening , pt. has a history of bone cancer / breast cancer - scheduled for chemotherapy tomorrow.

## 2013-11-03 ENCOUNTER — Other Ambulatory Visit: Payer: 59 | Admitting: Lab

## 2013-11-03 ENCOUNTER — Observation Stay (HOSPITAL_COMMUNITY): Payer: 59

## 2013-11-03 ENCOUNTER — Encounter (HOSPITAL_COMMUNITY): Payer: Self-pay | Admitting: Internal Medicine

## 2013-11-03 ENCOUNTER — Ambulatory Visit: Payer: 59 | Admitting: Oncology

## 2013-11-03 DIAGNOSIS — C50919 Malignant neoplasm of unspecified site of unspecified female breast: Secondary | ICD-10-CM

## 2013-11-03 DIAGNOSIS — R42 Dizziness and giddiness: Principal | ICD-10-CM

## 2013-11-03 DIAGNOSIS — D649 Anemia, unspecified: Secondary | ICD-10-CM

## 2013-11-03 DIAGNOSIS — R270 Ataxia, unspecified: Secondary | ICD-10-CM | POA: Diagnosis present

## 2013-11-03 DIAGNOSIS — R55 Syncope and collapse: Secondary | ICD-10-CM

## 2013-11-03 DIAGNOSIS — D696 Thrombocytopenia, unspecified: Secondary | ICD-10-CM

## 2013-11-03 LAB — CBC WITH DIFFERENTIAL/PLATELET
Basophils Absolute: 0.4 10*3/uL — ABNORMAL HIGH (ref 0.0–0.1)
Basophils Absolute: 0.4 10*3/uL — ABNORMAL HIGH (ref 0.0–0.1)
Eosinophils Absolute: 0.1 10*3/uL (ref 0.0–0.7)
Eosinophils Relative: 2 % (ref 0–5)
Lymphocytes Relative: 24 % (ref 12–46)
Lymphs Abs: 1.7 10*3/uL (ref 0.7–4.0)
Lymphs Abs: 1.8 10*3/uL (ref 0.7–4.0)
MCH: 30 pg (ref 26.0–34.0)
MCHC: 31.7 g/dL (ref 30.0–36.0)
MCV: 91.7 fL (ref 78.0–100.0)
Monocytes Absolute: 1.3 10*3/uL — ABNORMAL HIGH (ref 0.1–1.0)
Monocytes Relative: 21 % — ABNORMAL HIGH (ref 3–12)
Monocytes Relative: 29 % — ABNORMAL HIGH (ref 3–12)
Neutro Abs: 2.9 10*3/uL (ref 1.7–7.7)
Neutrophils Relative %: 45 % (ref 43–77)
Neutrophils Relative %: 40 % — ABNORMAL LOW (ref 43–77)
Platelets: 38 10*3/uL — ABNORMAL LOW (ref 150–400)
Platelets: 31 10*3/uL — ABNORMAL LOW (ref 150–400)
RDW: 17.3 % — ABNORMAL HIGH (ref 11.5–15.5)
RDW: 17.3 % — ABNORMAL HIGH (ref 11.5–15.5)
WBC: 6.4 10*3/uL (ref 4.0–10.5)
WBC: 7.4 10*3/uL (ref 4.0–10.5)

## 2013-11-03 LAB — ABO/RH: ABO/RH(D): O POS

## 2013-11-03 LAB — TSH: TSH: 0.714 u[IU]/mL (ref 0.350–4.500)

## 2013-11-03 LAB — COMPREHENSIVE METABOLIC PANEL
ALT: 13 U/L (ref 0–35)
Albumin: 3.4 g/dL — ABNORMAL LOW (ref 3.5–5.2)
Alkaline Phosphatase: 224 U/L — ABNORMAL HIGH (ref 39–117)
BUN: 19 mg/dL (ref 6–23)
Creatinine, Ser: 0.44 mg/dL — ABNORMAL LOW (ref 0.50–1.10)
GFR calc non Af Amer: >90 mL/min (ref >90)
Potassium: 4 mEq/L (ref 3.5–5.1)
Sodium: 137 mEq/L (ref 135–145)
Total Protein: 7.1 g/dL (ref 6.0–8.3)

## 2013-11-03 LAB — T4, FREE: Free T4: 1.29 ng/dL (ref 0.80–1.80)

## 2013-11-03 LAB — TYPE AND SCREEN
ABO/RH(D): O POS
Antibody Screen: NEGATIVE

## 2013-11-03 LAB — PATHOLOGIST SMEAR REVIEW

## 2013-11-03 LAB — T3, FREE: T3, Free: 2.9 pg/mL (ref 2.3–4.2)

## 2013-11-03 MED ORDER — LORATADINE 10 MG PO TABS
10.0000 mg | ORAL_TABLET | Freq: Every day | ORAL | Status: DC
  Administered 2013-11-03 – 2013-11-04 (×2): 10 mg via ORAL
  Filled 2013-11-03 (×3): qty 1

## 2013-11-03 MED ORDER — GADOBENATE DIMEGLUMINE 529 MG/ML IV SOLN
10.0000 mL | Freq: Once | INTRAVENOUS | Status: AC
  Administered 2013-11-03: 10 mL via INTRAVENOUS

## 2013-11-03 MED ORDER — PANTOPRAZOLE SODIUM 40 MG PO TBEC
40.0000 mg | DELAYED_RELEASE_TABLET | Freq: Every day | ORAL | Status: DC
  Administered 2013-11-03 – 2013-11-04 (×2): 40 mg via ORAL
  Filled 2013-11-03 (×2): qty 1

## 2013-11-03 MED ORDER — ONDANSETRON HCL 4 MG PO TABS: 4.0000 mg | ORAL_TABLET | Freq: Four times a day (QID) | ORAL | Status: DC | PRN

## 2013-11-03 MED ORDER — SODIUM CHLORIDE 0.9 % IV SOLN
INTRAVENOUS | Status: AC
  Administered 2013-11-03: 06:00:00 via INTRAVENOUS

## 2013-11-03 MED ORDER — ACETAMINOPHEN 650 MG RE SUPP: 650.0000 mg | Freq: Four times a day (QID) | RECTAL | Status: DC | PRN

## 2013-11-03 MED ORDER — MECLIZINE HCL 25 MG PO TABS
25.0000 mg | ORAL_TABLET | Freq: Once | ORAL | Status: AC
  Administered 2013-11-03: 25 mg via ORAL
  Filled 2013-11-03: qty 1

## 2013-11-03 MED ORDER — TRAMADOL HCL 50 MG PO TABS: 25.0000 mg | ORAL_TABLET | Freq: Four times a day (QID) | ORAL | Status: DC | PRN

## 2013-11-03 MED ORDER — MECLIZINE HCL 25 MG PO TABS: 25.0000 mg | ORAL_TABLET | Freq: Three times a day (TID) | ORAL | Status: DC | PRN

## 2013-11-03 MED ORDER — LORAZEPAM 2 MG/ML IJ SOLN
INTRAMUSCULAR | Status: AC
  Administered 2013-11-03: 1 mg
  Filled 2013-11-03: qty 1

## 2013-11-03 MED ORDER — MIRTAZAPINE 15 MG PO TABS
15.0000 mg | ORAL_TABLET | Freq: Every day | ORAL | Status: DC
  Filled 2013-11-03: qty 1

## 2013-11-03 MED ORDER — SODIUM CHLORIDE 0.9 % IJ SOLN: 3.0000 mL | Freq: Two times a day (BID) | INTRAMUSCULAR | Status: DC

## 2013-11-03 MED ORDER — HYDROCODONE-ACETAMINOPHEN 5-325 MG PO TABS: 1.0000 | ORAL_TABLET | Freq: Four times a day (QID) | ORAL | Status: DC | PRN

## 2013-11-03 MED ORDER — ACETAMINOPHEN 325 MG PO TABS: 650.0000 mg | ORAL_TABLET | Freq: Four times a day (QID) | ORAL | Status: DC | PRN

## 2013-11-03 MED ORDER — ANASTROZOLE 1 MG PO TABS
1.0000 mg | ORAL_TABLET | Freq: Every day | ORAL | Status: DC
  Administered 2013-11-03 – 2013-11-04 (×2): 1 mg via ORAL
  Filled 2013-11-03 (×2): qty 1

## 2013-11-03 MED ORDER — MIRTAZAPINE 7.5 MG PO TABS
7.5000 mg | ORAL_TABLET | Freq: Every day | ORAL | Status: DC
  Administered 2013-11-03: 7.5 mg via ORAL
  Filled 2013-11-03 (×3): qty 1

## 2013-11-03 MED ORDER — ONDANSETRON HCL 4 MG/2ML IJ SOLN
4.0000 mg | Freq: Four times a day (QID) | INTRAMUSCULAR | Status: DC | PRN
  Administered 2013-11-03 (×2): 4 mg via INTRAVENOUS
  Filled 2013-11-03 (×2): qty 2

## 2013-11-03 MED ORDER — DRONABINOL 2.5 MG PO CAPS
2.5000 mg | ORAL_CAPSULE | Freq: Two times a day (BID) | ORAL | Status: DC
Start: 2013-11-03 — End: 2013-11-04
  Administered 2013-11-03 – 2013-11-04 (×3): 2.5 mg via ORAL
  Filled 2013-11-03 (×3): qty 1

## 2013-11-03 MED ORDER — VITAMIN D3 125 MCG (5000 UT) PO TABS: 5000.0000 mg | ORAL_TABLET | Freq: Every day | ORAL | Status: DC

## 2013-11-03 MED ORDER — VITAMIN D3 25 MCG (1000 UNIT) PO TABS
5000.0000 [IU] | ORAL_TABLET | Freq: Every day | ORAL | Status: DC
  Administered 2013-11-03 – 2013-11-04 (×2): 5000 [IU] via ORAL
  Filled 2013-11-03 (×2): qty 5

## 2013-11-03 MED ORDER — MECLIZINE HCL 12.5 MG PO TABS
12.5000 mg | ORAL_TABLET | Freq: Two times a day (BID) | ORAL | Status: DC | PRN
  Filled 2013-11-03: qty 1

## 2013-11-03 NOTE — H&P (Signed)
[facility] History and Physical  Gina [name] ZOX:[id number] DOB: [birth year] DOA: 11/02/2013  Referring physician: ER physician. PCP: Gina Hoff, MD  Specialists: Dr. Suszanne [name]. Oncologist.  Chief Complaint: Dizziness.  HPI: Gina [name] is a 67 y.o. female with history of breast cancer, anemia and thrombocytopenia probably related to breast cancer started experiencing sudden onset of dizziness last evening around 4 PM. Patient felt dizzy only when she tried to walk. On lying down her dizziness resolved. Patient did not have any sensation of palpitations chest pain but did have some nausea patient did not have any focal deficits blurred vision headache diarrhea. In the ER patient was found to be ataxic on trying to walk. CT head did not show anything acute. Patient has been admitted for further management.   Review of Systems: As presented in the history of presenting illness, rest negative.  Past Medical History  Diagnosis Date  . Breast cancer 04/28/2013  . Myalgia and myositis, unspecified   . Anemia    History reviewed. No pertinent past surgical history. Social History:  reports that she has never smoked. She has never used smokeless tobacco. She reports that she drinks about 0.6 ounces of alcohol per week. She reports that she does not use illicit drugs. Where does patient live home. Can patient participate in ADLs? Yes.  Allergies  Allergen Reactions  . Sudafed [Pseudoephedrine] Anaphylaxis and Anxiety    Family History: History reviewed. No pertinent family history.    Prior to Admission medications   Medication Sig Start Date End Date Taking? Authorizing Provider  anastrozole (ARIMIDEX) 1 MG tablet Take 1 tablet (1 mg total) by mouth daily. 08/26/13  Yes Illa Level, NP  cetirizine (ZYRTEC) 10 MG tablet Take 10 mg by mouth daily.     Yes Historical Provider, MD  Cholecalciferol (VITAMIN D3) 5000 UNITS TABS Take 5,000 mg by mouth daily.   Yes Historical  Provider, MD  dronabinol (MARINOL) 2.5 MG capsule Take 2.5 mg by mouth 2 (two) times daily before a meal.   Yes Historical Provider, MD  HYDROcodone-acetaminophen (NORCO/VICODIN) 5-325 MG per tablet Take 1 tablet by mouth every 6 (six) hours as needed for moderate pain.   Yes Historical Provider, MD  [place] Mushroom POWD 5 mLs by Does not apply route daily.   Yes Historical Provider, MD  mirtazapine (REMERON) 15 MG tablet Take 7.5 mg by mouth at bedtime. 08/21/13  Yes Illa Level, NP  omeprazole (PRILOSEC) 40 MG capsule Take 40 mg by mouth daily.   Yes Historical Provider, MD  traMADol (ULTRAM) 50 MG tablet Take 0.5-1 tablets (25-50 mg total) by mouth every 6 (six) hours as needed for pain. 08/13/13  Yes Illa Level, NP  meclizine (ANTIVERT) 25 MG tablet Take 1 tablet (25 mg total) by mouth 3 (three) times daily as needed for dizziness. 11/03/13   Arthor Captain, PA-C    Physical Exam: Filed Vitals:   11/03/13 0315 11/03/13 0330 11/03/13 0343 11/03/13 0359  BP: 116/58 107/49  124/70  Pulse: 95 94  103  Temp:   98.4 F (36.9 C) 97.6 F (36.4 C)  TempSrc:    Oral  Resp:    16  Height:    5\' 3"  (1.6 m)  Weight:    45.632 kg (100 lb 9.6 oz)  SpO2: 97% 96% 95% 96%     General:  Well developed and moderately nourished.  Eyes: Anicteric no pallor.  ENT: No discharge from the ears eyes nose  or mouth.  Neck: No mass felt.  Cardiovascular: S1-S2 heard.  Respiratory: No rhonchi or crepitations.  Abdomen: Soft nontender bowel sounds present.  Skin: No rash.  Musculoskeletal: No edema.  Psychiatric: Appears normal.  Neurologic: Alert awake oriented to time place and person. Moves all extremities.  Labs on Admission:  Basic Metabolic Panel:  Recent Labs Lab 11/02/13 2007  NA 139  K 4.0  CL 101  CO2 25  GLUCOSE 156*  BUN 25*  CREATININE 0.46*  CALCIUM 8.8   Liver Function Tests:  Recent Labs Lab 11/02/13 2007  AST 34  ALT 15  ALKPHOS 238*  BILITOT  0.3  PROT 7.4  ALBUMIN 3.5   No results found for this basename: LIPASE, AMYLASE,  in the last 168 hours No results found for this basename: AMMONIA,  in the last 168 hours CBC:  Recent Labs Lab 11/02/13 2123  WBC 6.4  NEUTROABS 2.9  HGB 10.1*  HCT 30.9*  MCV 91.7  PLT 38*   Cardiac Enzymes: No results found for this basename: CKTOTAL, CKMB, CKMBINDEX, TROPONINI,  in the last 168 hours  BNP (last 3 results) No results found for this basename: PROBNP,  in the last 8760 hours CBG: No results found for this basename: GLUCAP,  in the last 168 hours  Radiological Exams on Admission: Ct Head Wo Contrast  11/02/2013   CLINICAL DATA:  Dizziness and nauseated after a nap today. History of breast cancer.  EXAM: CT HEAD WITHOUT CONTRAST  TECHNIQUE: Contiguous axial images were obtained from the base of the skull through the vertex without intravenous contrast.  COMPARISON:  None.  FINDINGS: There is no intra or extra-axial fluid collection or mass lesion. The basilar cisterns and ventricles have a normal appearance. There is no CT evidence for acute infarction or hemorrhage. Bone windows are unremarkable.  IMPRESSION: No evidence for acute intracranial abnormality.   Electronically Signed   By: Rosalie Gums M.D.   On: 11/02/2013 23:55     Assessment/Plan Principal Problem:   Dizziness Active Problems:   Breast cancer   Anemia, unspecified   Thrombocytopenia, unspecified   1. Dizziness - patient's dizziness is positional particularly on standing. We will check orthostatic changes. Gently hydrate. Recheck hemoglobin to see if there is any further decrease. Check MRI brain with contrast given history of breast cancer. As per patient's husband patient was originally scheduled for MRI brain last month. Physical therapy consult. Ativan has been ordered one dose one hour prior to MRI brain secondary to claustrophobia. 2. Sinus tachycardia - monitor shows sinus tachycardia. We will get an EKG  to check the rhythm. Check thyroid function test and 2-D echo. 3. Anemia and severe thrombocytopenia - probably related to patient's known cancer. Closely follow CBC and if there is any further decrease may need transfusion. As per the husband patient did receive transfusion in October both PRBC and platelets. Presently patient denies any obvious bleeding. 4. Breast cancer - per oncologist.    Code Status: Full code.  Family Communication: Family at the bedside.  Disposition Plan: Admit to inpatient.    KAKRAKANDY,ARSHAD N. [facility] Pager [phone].  If 7PM-7AM, please contact night-coverage [email or web address] Password [email or web address] 11/03/2013, 5:14 AM

## 2013-11-03 NOTE — Progress Notes (Signed)
Echo Lab  2D Echocardiogram completed.  Lavilla Delamora L Vanya Carberry, RDCS 11/03/2013 12:47 PM

## 2013-11-03 NOTE — ED Notes (Signed)
Patient pass stoke swollow study

## 2013-11-03 NOTE — Progress Notes (Signed)
Physical Therapy Treatment Patient Details Name: Gina [name] MRN: [id number] DOB: Feb 06, 1946 Today's Date: 11/03/2013 Time: 1191-4782 PT Time Calculation (min): 23 min  PT Assessment / Plan / Recommendation  History of Present Illness 67 y.o. female admitted to [facility] on 11/02/13 with history of breast cancer, anemia and thrombocytopenia probably related to breast cancer started experiencing sudden onset of dizziness last evening around 4 PM. Patient felt dizzy only when she tried to walk. On lying down her dizziness resolved. Patient did not have any sensation of palpitations chest pain but did have some nausea patient did not have any focal deficits blurred vision headache diarrhea. In the ER patient was found to be ataxic on trying to walk. CT head did not show anything acute.  MRI pending.     PT Comments   I came back this PM to assess gait and tolerance of treatment preformed earlier for her BPPV.  Pt was feeling better in general and was able to ambulate down the hallway with min hand held assist.  She became very nauseated and started dry heaving at the end of our session together, but it passed quickly.  I mentioned some compensation strategies that we could use next session (RW with a target) and the needed to continue to asses, re-test and treat her positional vertigo (need to test the right side [employer] and preform the log roll test to r/o horizontal canal involvement).    Follow Up Recommendations  Outpatient PT;Supervision for mobility/OOB (for vestibular therapy)     Does the patient have the potential to tolerate intense rehabilitation    NA  Barriers to Discharge   Husband works, but sister is up from [place] and can stay as needed.        Equipment Recommendations  None recommended by PT (pt owns a cane and a RW)    Recommendations for Other Services   None  Frequency Min 3X/week   Progress towards PT Goals Progress towards PT goals: Progressing toward goals  Plan Current  plan remains appropriate    Precautions / Restrictions Precautions Precautions: Fall   Pertinent Vitals/Pain See vitals flow sheet.     Mobility  Bed Mobility Bed Mobility: Supine to Sit;Sitting - Scoot to Edge of Bed;Sit to Supine Supine to Sit: 6: Modified independent (Device/Increase time);With rails;HOB elevated Sitting - Scoot to Edge of Bed: 6: Modified independent (Device/Increase time);With rail Sit to Supine: 6: Modified independent (Device/Increase time);With rail;HOB elevated Details for Bed Mobility Assistance: pt able to get to EOB easily with assist of railing.   Transfers Transfers: Sit to Stand;Stand to Sit Sit to Stand: 4: Min guard;With upper extremity assist;With armrests;From bed;From toilet Stand to Sit: 4: Min guard;With upper extremity assist;With armrests;To bed;To toilet Stand Pivot Transfers: 4: Min guard;With armrests Details for Transfer Assistance: min guard assist to steady pt for balance.  Ambulation/Gait Ambulation/Gait Assistance: 4: Min assist Ambulation Distance (Feet): 150 Feet Assistive device: 1 person hand held assist Ambulation/Gait Assistance Details: one person hand held assist to steady pt for balance due to staggering gait pattern. I had pt's husband asisst her for part of ambulation so he could get a sense of how much help she needed during gait.  They both report this was better than PTA.   Gait Pattern: Step-through pattern (staggering) Gait velocity: decreased    Exercises Other Exercises Other Exercises: Epley's Maneuver preformed x 2 with pt to try to clear L BPPV posterior canal.     PT Diagnosis: Difficulty  walking;Abnormality of gait;Generalized weakness  PT Problem List: Decreased strength;Decreased activity tolerance;Decreased balance;Decreased mobility;Decreased knowledge of precautions PT Treatment Interventions: DME instruction;Gait training;Stair training;Functional mobility training;Therapeutic activities;Balance  training;Therapeutic exercise;Neuromuscular re-education;Patient/family education   PT Goals (current goals can now be found in the care plan section) Acute Rehab PT Goals Patient Stated Goal: to get back to feeling better, more balanced PT Goal Formulation: With patient Time For Goal Achievement: 11/17/13 Potential to Achieve Goals: Good  Visit Information  Last PT Received On: 11/03/13 Assistance Needed: +1 History of Present Illness: 67 y.o. female admitted to [facility] on 11/02/13 with history of breast cancer, anemia and thrombocytopenia probably related to breast cancer started experiencing sudden onset of dizziness last evening around 4 PM. Patient felt dizzy only when she tried to walk. On lying down her dizziness resolved. Patient did not have any sensation of palpitations chest pain but did have some nausea patient did not have any focal deficits blurred vision headache diarrhea. In the ER patient was found to be ataxic on trying to walk. CT head did not show anything acute.  MRI pending.      Subjective Data  Subjective: Pt reports feeling midly better after this morning's treatment session.  She is sleepy, but ready to walk.  Husband and sister are in room with her.   Patient Stated Goal: to get back to feeling better, more balanced   Cognition  Cognition Arousal/Alertness: Lethargic Behavior During Therapy: WFL for tasks assessed/performed Overall Cognitive Status: Within Functional Limits for tasks assessed    Balance  Balance Balance Assessed: Yes Static Sitting Balance Static Sitting - Balance Support: No upper extremity supported;Feet supported Static Sitting - Level of Assistance: 7: Independent Static Standing Balance Static Standing - Balance Support: Left upper extremity supported Static Standing - Level of Assistance: 4: Min assist Dynamic Standing Balance Dynamic Standing - Balance Support: Left upper extremity supported Dynamic Standing - Level of Assistance: 4:  Min assist  End of Session PT - End of Session Equipment Utilized During Treatment: Gait belt Activity Tolerance: Patient limited by fatigue;Other (comment) (nausea) Patient left: in bed;with call bell/phone within reach;with family/visitor present Nurse Communication: Other (comment) (nauseated, dry heaving)   GP Functional Assessment Tool Used: assist levels Functional Limitation: Mobility: Walking and moving around Mobility: Walking and Moving Around Current Status (Z3086): At least 1 percent but less than 20 percent impaired, limited or restricted Mobility: Walking and Moving Around Goal Status [id number]): At least 1 percent but less than 20 percent impaired, limited or restricted   Dhrithi Riche B. Shalandria Elsbernd, PT, DPT [phone]   11/03/2013, 5:30 PM

## 2013-11-03 NOTE — Progress Notes (Signed)
[facility] PROGRESS NOTE  KENLY HENCKEL ZOX:[id number] DOB: 1946/04/27 DOA: 11/02/2013 PCP: [name], MD  Assessment/Plan: 67 y.o. female with history of metastatic recurrent breast cancer, anemia thrombocytopenia probably related to breast cancer started experiencing sudden onset of intermittent dizziness  1. Dizziness of unclear etiology; CT: no acute findings; neuro exam no focal; possible orthostatic related dizziness;  -r/o metastatic disease pend MRI head; pend echo; cont IVF; meclizine; PT/T eval    2. Sinus tachycardia, no active cardiopulmonary symptoms; pend echo; cont monitor on tele    3. Anemia, thrombocytopenia likely related to underlying cancer; no s/s of acute bleeding; cont monitor; added Dr. Welton Flakes to treatment team   4. Breast CA, metastatic recurrent; cont per hem/onc outpatient   Code Status: full Family Communication: none at the bedsdie (indicate person spoken with, relationship, and if by phone, the number) Disposition Plan: home 24-48 hours    Consultants:  None, but added Dr. Park Breed to treatment team to inform about admission; if need may need to call   Procedures:  MRI pend   Antibiotics:  None  (indicate start date, and stop date if known)  HPI/Subjective: alert  Objective: Filed Vitals:   11/03/13 0804  BP: 142/111  Pulse: 103  Temp:   Resp: 16   No intake or output data in the 24 hours ending 11/03/13 1011 Filed Weights   11/02/13 2002 11/03/13 0359  Weight: 45.813 kg (101 lb) 45.632 kg (100 lb 9.6 oz)    Exam:   General:  alert  Cardiovascular: s1,s2 rrr  Respiratory: CTA BL  Abdomen: soft, nt, nd   Musculoskeletal: no LE edema   Data Reviewed: Basic Metabolic Panel:  Recent Labs Lab 11/02/13 2007 11/03/13 0548  NA 139 137  K 4.0 4.0  CL 101 100  CO2 25 25  GLUCOSE 156* 113*  BUN 25* 19  CREATININE 0.46* 0.44*  CALCIUM 8.8 7.6*   Liver Function Tests:  Recent Labs Lab 11/02/13 2007 11/03/13 0548   AST 34 31  ALT 15 13  ALKPHOS 238* 224*  BILITOT 0.3 0.4  PROT 7.4 7.1  ALBUMIN 3.5 3.4*   No results found for this basename: LIPASE, AMYLASE,  in the last 168 hours No results found for this basename: AMMONIA,  in the last 168 hours CBC:  Recent Labs Lab 11/02/13 2123 11/03/13 0548  WBC 6.4 7.4  NEUTROABS 2.9 3.0  HGB 10.1* 10.0*  HCT 30.9* 31.5*  MCV 91.7 93.2  PLT 38* 31*   Cardiac Enzymes: No results found for this basename: CKTOTAL, CKMB, CKMBINDEX, TROPONINI,  in the last 168 hours BNP (last 3 results) No results found for this basename: PROBNP,  in the last 8760 hours CBG: No results found for this basename: GLUCAP,  in the last 168 hours  No results found for this or any previous visit (from the past 240 hour(s)).   Studies: Ct Head Wo Contrast  11/02/2013   CLINICAL DATA:  Dizziness and nauseated after a nap today. History of breast cancer.  EXAM: CT HEAD WITHOUT CONTRAST  TECHNIQUE: Contiguous axial images were obtained from the base of the skull through the vertex without intravenous contrast.  COMPARISON:  None.  FINDINGS: There is no intra or extra-axial fluid collection or mass lesion. The basilar cisterns and ventricles have a normal appearance. There is no CT evidence for acute infarction or hemorrhage. Bone windows are unremarkable.  IMPRESSION: No evidence for acute intracranial abnormality.   Electronically Signed   By: Waynetta Sandy  Manson Passey M.D.   On: 11/02/2013 23:55    Scheduled Meds: . anastrozole  1 mg Oral Daily  . cholecalciferol  5,000 Units Oral Daily  . dronabinol  2.5 mg Oral BID AC  . loratadine  10 mg Oral Daily  . mirtazapine  7.5 mg Oral QHS  . pantoprazole  40 mg Oral Daily  . sodium chloride  3 mL Intravenous Q12H   Continuous Infusions: . sodium chloride 50 mL/hr at 11/03/13 0537    Principal Problem:   Dizziness Active Problems:   Breast cancer   Anemia, unspecified   Thrombocytopenia, unspecified    Time spent: >35 minutes      Esperanza Sheets  [facility] Pager [phone]. If 7PM-7AM, please contact night-coverage at [email or web address], password [facility] 11/03/2013, 10:11 AM  LOS: 1 day

## 2013-11-03 NOTE — Evaluation (Signed)
Physical Therapy Evaluation Patient Details Name: Gina [name] MRN: [id number] DOB: August 13, 1946 Today's Date: 11/03/2013 Time: 4010-2725 PT Time Calculation (min): 40 min  PT Assessment / Plan / Recommendation History of Present Illness  67 y.o. female admitted to [facility] on 11/02/13 with history of breast cancer, anemia and thrombocytopenia probably related to breast cancer started experiencing sudden onset of dizziness last evening around 4 PM. Patient felt dizzy only when she tried to walk. On lying down her dizziness resolved. Patient did not have any sensation of palpitations chest pain but did have some nausea patient did not have any focal deficits blurred vision headache diarrhea. In the ER patient was found to be ataxic on trying to walk. CT head did not show anything acute.  MRI pending.    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  I preformed Epley's maneuver x 2 for this side with positive results (increased tolerance of sitting).  PT plans to come back this PM to assess gait, but pt will need OP PT f/u for vestibular therapy at discharge.  [facility] Neuro rehab clinic is most appropriate.  PT to follow acutely for deficits listed below.       PT Assessment  Patient needs continued PT services    Follow Up Recommendations  Outpatient PT;Supervision for mobility/OOB    Does the patient have the potential to tolerate intense rehabilitation     NA  Barriers to Discharge   None      Equipment Recommendations  None recommended by PT    Recommendations for Other Services   None  Frequency Min 3X/week    Precautions / Restrictions Precautions Precautions: Fall   Pertinent Vitals/Pain See vitals flow sheet. Earlier orthostatic vitals were (-).     Mobility  Bed Mobility Bed Mobility: Supine to Sit;Sitting - Scoot to Edge of Bed;Sit to Supine Supine to Sit: 6: Modified independent (Device/Increase time);With rails;HOB flat Sitting -  Scoot to Edge of Bed: 6: Modified independent (Device/Increase time);With rail Sit to Supine: 6: Modified independent (Device/Increase time);With rail;HOB elevated Details for Bed Mobility Assistance: pt able to move EOB with upper extremity assist and increased time to compensate for dizziness.   Transfers Transfers: Sit to Stand;Stand to [employer] Transfers Sit to Stand: 4: Min guard;With upper extremity assist;With armrests;From bed Stand to Sit: 4: Min guard;With upper extremity assist;With armrests;To chair/3-in-1 Stand Pivot Transfers: 4: Min guard;With armrests Details for Transfer Assistance: min guard assist to steady pt for balance and support trunk during transitions.  Heavy reliance on hands for balance and support when moving.  Ambulation/Gait Ambulation/Gait Assistance: Not tested (comment) (PT to come back later and assess ambulation)    Exercises Other Exercises Other Exercises: Epley's Maneuver preformed x 2 with pt to try to clear L BPPV posterior canal.     PT Diagnosis: Difficulty walking;Abnormality of gait;Generalized weakness  PT Problem List: Decreased strength;Decreased activity tolerance;Decreased balance;Decreased mobility;Decreased knowledge of precautions PT Treatment Interventions: DME instruction;Gait training;Stair training;Functional mobility training;Therapeutic activities;Balance training;Therapeutic exercise;Neuromuscular re-education;Patient/family education     PT Goals(Current goals can be found in the care plan section) Acute Rehab PT Goals Patient Stated Goal: to get back to feeling better, more balanced PT Goal Formulation: With patient Time For Goal Achievement: 11/17/13 Potential to Achieve Goals: Good  Visit Information  Last PT Received On: 11/03/13 Assistance Needed: +1 History of Present Illness: 67 y.o. female admitted to [facility] on 11/02/13 with history of breast cancer,  anemia and thrombocytopenia probably related to breast cancer  started experiencing sudden onset of dizziness last evening around 4 PM. Patient felt dizzy only when she tried to walk. On lying down her dizziness resolved. Patient did not have any sensation of palpitations chest pain but did have some nausea patient did not have any focal deficits blurred vision headache diarrhea. In the ER patient was found to be ataxic on trying to walk. CT head did not show anything acute.  MRI pending.         Prior Functioning  Home Living Family/patient expects to be discharged to:: Private residence Living Arrangements: Spouse/significant other Prior Function Level of Independence: Independent Communication Communication: No difficulties    Cognition  Cognition Arousal/Alertness: Awake/alert Behavior During Therapy: WFL for tasks assessed/performed Overall Cognitive Status: Within Functional Limits for tasks assessed    Extremity/Trunk Assessment Upper Extremity Assessment Upper Extremity Assessment: Overall WFL for tasks assessed Lower Extremity Assessment Lower Extremity Assessment: Generalized weakness Cervical / Trunk Assessment Cervical / Trunk Assessment: Normal   Balance Balance Balance Assessed: Yes Static Sitting Balance Static Sitting - Balance Support: Bilateral upper extremity supported;Feet supported Static Sitting - Level of Assistance: 6: Modified independent (Device/Increase time) Static Standing Balance Static Standing - Balance Support: Bilateral upper extremity supported Static Standing - Level of Assistance: 4: Min assist Dynamic Standing Balance Dynamic Standing - Balance Support: Bilateral upper extremity supported Dynamic Standing - Level of Assistance: 4: Min assist  End of Session PT - End of Session Equipment Utilized During Treatment: Gait belt Activity Tolerance: Patient limited by fatigue;Other (comment) (limited by nausea and dizziness)    [name], PT, DPT [phone]   11/03/2013, 1:50 PM

## 2013-11-03 NOTE — H&P (Signed)
PATIENT IS JEHOVAH WITNESS BUT TODAY ( 11/03/2013 ) states she is willing  To have a blood transfusion

## 2013-11-03 NOTE — Progress Notes (Signed)
Utilization review completed. Tasheba Henson, RN, BSN. 

## 2013-11-04 ENCOUNTER — Other Ambulatory Visit: Payer: Self-pay | Admitting: Radiology

## 2013-11-04 ENCOUNTER — Telehealth: Payer: Self-pay | Admitting: *Deleted

## 2013-11-04 LAB — BASIC METABOLIC PANEL WITH GFR
BUN: 8 mg/dL (ref 6–23)
CO2: 23 meq/L (ref 19–32)
Calcium: 7.3 mg/dL — ABNORMAL LOW (ref 8.4–10.5)
Chloride: 108 meq/L (ref 96–112)
Creatinine, Ser: 0.43 mg/dL — ABNORMAL LOW (ref 0.50–1.10)
GFR calc Af Amer: >90 mL/min
GFR calc non Af Amer: >90 mL/min
Glucose, Bld: 97 mg/dL (ref 70–99)
Potassium: 3.8 meq/L (ref 3.5–5.1)
Sodium: 140 meq/L (ref 135–145)

## 2013-11-04 LAB — CBC
MCH: 29.7 pg (ref 26.0–34.0)
MCV: 96 fL (ref 78.0–100.0)
Platelets: 32 10*3/uL — ABNORMAL LOW (ref 150–400)
RBC: 3.23 MIL/uL — ABNORMAL LOW (ref 3.87–5.11)
RDW: 17.7 % — ABNORMAL HIGH (ref 11.5–15.5)

## 2013-11-04 MED ORDER — MECLIZINE HCL 25 MG PO TABS: 25.0000 mg | ORAL_TABLET | Freq: Three times a day (TID) | ORAL | Status: DC | PRN

## 2013-11-04 MED ORDER — BOOST PLUS PO LIQD
237.0000 mL | ORAL | Status: DC
  Filled 2013-11-04: qty 237

## 2013-11-04 MED ORDER — BOOST / RESOURCE BREEZE PO LIQD: 1.0000 | ORAL | Status: DC

## 2013-11-04 MED ORDER — ONDANSETRON HCL 4 MG PO TABS: 4.0000 mg | ORAL_TABLET | Freq: Four times a day (QID) | ORAL | Status: DC | PRN

## 2013-11-04 MED ORDER — MECLIZINE HCL 25 MG PO TABS
25.0000 mg | ORAL_TABLET | Freq: Three times a day (TID) | ORAL | Status: DC | PRN
Start: 2013-11-04 — End: 2014-07-12

## 2013-11-04 NOTE — Progress Notes (Signed)
Pt provided with dc instructions and education. Pt verbalized understanding. Pt has no questions at this time. Pt aware of procedure tomorrow at [facility]. A copy of blood products consent faxed to blood bank to prepare platelets for tomorrow. IV removed with tip intact. Heart monitor cleaned and returned to front. Levonne Spiller, RN

## 2013-11-04 NOTE — ED Provider Notes (Signed)
Medical screening examination/treatment/procedure(s) were conducted as a shared visit with non-physician practitioner(s) and myself.  I personally evaluated the patient during the encounter.  EKG Interpretation    Date/Time:    Ventricular Rate:    PR Interval:    QRS Duration:   QT Interval:    QTC Calculation:   R Axis:     Text Interpretation:             I interviewed and examined the patient. Lungs are CTAB. Cardiac exam wnl. Abdomen soft.  Pt too sedated to complete neuro exam for me. Will plan on admission for MRI.    Junius Argyle, MD 11/04/13 [phone]

## 2013-11-04 NOTE — Telephone Encounter (Signed)
Pt to have pac placed on 11/05/13. Spoke with pt's husband gave instructions NPO after 0630, pt to be at [facility] at 1130am.  Mr. Reifschneider verbaIized understanding, no other concerns.

## 2013-11-04 NOTE — Progress Notes (Signed)
INITIAL NUTRITION ASSESSMENT  DOCUMENTATION CODES Per approved criteria  -Severe malnutrition in the context of chronic illness -Underweight   INTERVENTION: - [employer] daily, provides 250 kcal, 9 g protein - Boost daily, provides 350 kcal, 13 g protein - Encourage adequate intake of meals  NUTRITION DIAGNOSIS: Inadequate oral intake related to decreased appetite as evidenced by 0% meal intake.   Goal: Patient will meet >/=90% of estimated nutrition needs  Monitor:  PO intake, weight, labs, I/Os  Reason for Assessment: Malnutrition screening tool  67 y.o. female  Admitting Dx: Dizziness  ASSESSMENT: Patient with a history of metastatic recurrent breast cancer, admitted with dizziness. She reports that she has not been eating well due to decreased appetite. She says this happens when the cancer comes back. 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   She is currently not eating due to the possibility for procedure today.   Patient meets the criteria for severe MALNUTRITION in the context of chronic illness with PO intake <75% of estimated needs and severe fat and muscle wasting.   Nutrition Focused Physical Exam:  Subcutaneous Fat:  Orbital Region: severe wasting Upper Arm Region: severe wasting Thoracic and Lumbar Region: severe wasting  Muscle:  Temple Region: severe wasting Clavicle Bone Region: severe wasting Clavicle and Acromion Bone Region: severe wasting Scapular Bone Region: severe wasting Dorsal Hand: severe wasting Patellar Region: severe wasting Anterior Thigh Region: severe wasting Posterior Calf Region: severe wasting  Edema: WNL  Height: Ht Readings from Last 1 Encounters:  11/03/13 5\' 3"  (1.6 m)    Weight: Wt Readings from Last 1 Encounters:  11/04/13 97 lb 9.6 oz (44.271 kg)    Ideal Body Weight: 115 pounds  % Ideal Body Weight: 84%  Wt Readings from Last 10 Encounters:  11/04/13 97 lb 9.6 oz (44.271 kg)  09/30/13 105  lb 1 oz (47.656 kg)  09/23/13 102 lb 4.8 oz (46.403 kg)  09/09/13 98 lb 12.8 oz (44.815 kg)  08/26/13 94 lb 14.4 oz (43.046 kg)  08/21/13 96 lb 11.2 oz (43.863 kg)  08/13/13 99 lb 1.6 oz (44.951 kg)  08/10/13 100 lb (45.36 kg)  08/07/13 99 lb 8 oz (45.133 kg)  08/05/13 96 lb 11.2 oz (43.863 kg)    Usual Body Weight: 95-100 pounds  % Usual Body Weight: 100%  BMI:  Body mass index is 17.29 kg/(m^2). Patient is underweight.   Estimated Nutritional Needs: Kcal: 1350-1450 kcal Protein: 60-70 g Fluid: 1.3-1.4 L/day  Skin: Intact  Diet Order: Cardiac  EDUCATION NEEDS: -No education needs identified at this time  No intake or output data in the 24 hours ending 11/04/13 1248  Last BM: PTA   Labs:   Recent Labs Lab 11/02/13 2007 11/03/13 0548 11/04/13 0454  NA 139 137 140  K 4.0 4.0 3.8  CL 101 100 108  CO2 25 25 23   BUN 25* 19 8  CREATININE 0.46* 0.44* 0.43*  CALCIUM 8.8 7.6* 7.3*  GLUCOSE 156* 113* 97    CBG (last 3)  No results found for this basename: GLUCAP,  in the last 72 hours  Scheduled Meds: . anastrozole  1 mg Oral Daily  . cholecalciferol  5,000 Units Oral Daily  . dronabinol  2.5 mg Oral BID AC  . loratadine  10 mg Oral Daily  . mirtazapine  7.5 mg Oral QHS  . pantoprazole  40 mg Oral Daily  . sodium chloride  3 mL Intravenous Q12H    Continuous Infusions:  Past Medical History  Diagnosis Date  . Breast cancer 04/28/2013  . Myalgia and myositis, unspecified   . Anemia     Past Surgical History  Procedure Laterality Date  . Mastectomy Right   . Tubal ligation  1971    Linnell Fulling, RD, LDN Pager #: [phone] After-Hours Pager #: [phone]

## 2013-11-04 NOTE — Care Management Note (Signed)
    Page 1 of 1   11/04/2013     2:38:58 PM   CARE MANAGEMENT NOTE 11/04/2013  Patient:  Gina [name], Gina [name]   Account Number:  [id number]  Date Initiated:  11/04/2013  Documentation initiated by:  GRAVES-BIGELOW,Asiyah Pineau  Subjective/Objective Assessment:   Pt admitted with dizziness and plans to be d/c today with outpatient services at neuro rehab.     Action/Plan:   Cm did fax referral to outpatient rehab and they will call pt for time and day.   Anticipated DC Date:  11/04/2013   Anticipated DC Plan:  HOME/SELF CARE      DC Planning Services  CM consult      Choice offered to / List presented to:             Status of service:  Completed, signed off Medicare Important Message given?   (If response is "NO", the following Medicare IM given date fields will be blank) Date Medicare IM given:   Date Additional Medicare IM given:    Discharge Disposition:  HOME/SELF CARE  Per UR Regulation:  Reviewed for med. necessity/level of care/duration of stay  If discussed at Long Length of Stay Meetings, dates discussed:    Comments:

## 2013-11-04 NOTE — Discharge Summary (Signed)
Physician Discharge Summary  Gina [name] ZOX:[id number] DOB: Mar 08, 1946 DOA: 11/02/2013  PCP: Gina Hoff, MD  Admit date: 11/02/2013 Discharge date: 11/04/2013  Recommendations for Outpatient Follow-up:  1. Outpatient vestibular PT  Discharge Diagnoses:  Principal Problem:   Dizziness/BPPV Active Problems:   Breast cancer   Anemia, unspecified   Thrombocytopenia, unspecified  Discharge Condition: discharge  Filed Weights   11/02/13 2002 11/03/13 0359 11/04/13 0319  Weight: 45.813 kg (101 lb) 45.632 kg (100 lb 9.6 oz) 44.271 kg (97 lb 9.6 oz)    History of present illness:  HPI: Gina [name] is a 67 y.o. female with history of breast cancer, anemia and thrombocytopenia probably related to breast cancer started experiencing sudden onset of dizziness last evening around 4 PM. Patient felt dizzy only when she tried to walk. On lying down her dizziness resolved. Patient did not have any sensation of palpitations chest pain but did have some nausea patient did not have any focal deficits blurred vision headache diarrhea. In the ER patient was found to be ataxic on trying to walk. CT head did not show anything acute. Patient has been admitted for further management.   Hospital Course:  Placed on observation.  PT reports findings consistent with BPPV.  MRI showed dural enhancement, but no brain mets. Symptoms improved with vestibular PT and supportive care.   outpt vestibular rehab recommended.  Procedures:  stable  Discharge Exam: Filed Vitals:   11/04/13 0319  BP: 132/71  Pulse: 105  Temp: 98 F (36.7 C)  Resp: 18    General: comfortable. Alert oriented. Cardiovascular: RRR without MGR Respiratory: CTA without WRR  Discharge Instructions  Discharge Orders   Future Appointments Provider Department Dept Phone   11/05/2013 11:30 AM [facility] Room [facility] [phone]   11/05/2013 1:30 PM [facility]  [phone]   Future Orders Complete By Expires   Diet general  As directed    Increase activity slowly  As directed        Medication List         anastrozole 1 MG tablet  Commonly known as:  ARIMIDEX  Take 1 tablet (1 mg total) by mouth daily.     cetirizine 10 MG tablet  Commonly known as:  ZYRTEC  Take 10 mg by mouth daily.     dronabinol 2.5 MG capsule  Commonly known as:  MARINOL  Take 2.5 mg by mouth 2 (two) times daily before a meal.     HYDROcodone-acetaminophen 5-325 MG per tablet  Commonly known as:  NORCO/VICODIN  Take 1 tablet by mouth every 6 (six) hours as needed for moderate pain.     [place] Mushroom Powd  5 mLs by Does not apply route daily.     meclizine 25 MG tablet  Commonly known as:  ANTIVERT  Take 1 tablet (25 mg total) by mouth 3 (three) times daily as needed for dizziness.     mirtazapine 15 MG tablet  Commonly known as:  REMERON  Take 7.5 mg by mouth at bedtime.     omeprazole 40 MG capsule  Commonly known as:  PRILOSEC  Take 40 mg by mouth daily.     ondansetron 4 MG tablet  Commonly known as:  ZOFRAN  Take 1 tablet (4 mg total) by mouth every 6 (six) hours as needed for nausea.     traMADol 50 MG tablet  Commonly known as:  ULTRAM  Take 0.5-1 tablets (25-50 mg  total) by mouth every 6 (six) hours as needed for pain.     Vitamin D3 5000 UNITS Tabs  Take 5,000 mg by mouth daily.       Allergies  Allergen Reactions  . Sudafed [Pseudoephedrine] Anaphylaxis and Anxiety       Follow-up Information   Follow up with Gina Hoff, MD.   Specialty:  Family Medicine   Contact information:   [address], [address] [place] [address] [phone]        The results of significant diagnostics from this hospitalization (including imaging, microbiology, ancillary and laboratory) are listed below for reference.    Significant Diagnostic Studies: Ct Head Wo Contrast  11/02/2013   CLINICAL DATA:  Dizziness and nauseated  after a nap today. History of breast cancer.  EXAM: CT HEAD WITHOUT CONTRAST  TECHNIQUE: Contiguous axial images were obtained from the base of the skull through the vertex without intravenous contrast.  COMPARISON:  None.  FINDINGS: There is no intra or extra-axial fluid collection or mass lesion. The basilar cisterns and ventricles have a normal appearance. There is no CT evidence for acute infarction or hemorrhage. Bone windows are unremarkable.  IMPRESSION: No evidence for acute intracranial abnormality.   Electronically Signed   By: Rosalie Gums M.D.   On: 11/02/2013 23:55   Mr [name] BJ Contrast  11/03/2013   CLINICAL DATA:  New onset vertigo.  History of breast cancer.  EXAM: MRI HEAD WITHOUT AND WITH CONTRAST  TECHNIQUE: Multiplanar, multiecho pulse sequences of the brain and surrounding structures were obtained without and with intravenous contrast.  CONTRAST:  10mL MULTIHANCE GADOBENATE DIMEGLUMINE 529 MG/ML IV SOLN  COMPARISON:  11/02/2013 head CT.  10/03/2013 cervical spine MR.  FINDINGS: No acute infarct.  No intracranial hemorrhage.  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 The adjacent dural enhancement is suggested or spread of tumor.  No parenchymal enhancing lesion to suggest brain parenchymal metastatic disease. 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  Mild white matter type changes suggestive of result of small vessel disease.  Partial opacification left petrous apex and left mastoid air cells.  Major intracranial vascular structures are patent.  Cervical medullary junction, pituitary region, pineal region and orbital structures unremarkable.  IMPRESSION: No acute infarct.  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 The adjacent dural enhancement is suggested or spread of tumor.  No parenchymal enhancing lesion to suggest brain parenchymal  metastatic disease. 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  Mild white matter type changes suggestive of result of small vessel disease.  Partial opacification left petrous apex and left mastoid air cells.   Electronically Signed   By: Bridgett Larsson M.D.   On: 11/03/2013 19:36    Microbiology: No results found for this or any previous visit (from the past 240 hour(s)).   Labs: Basic Metabolic Panel:  Recent Labs Lab 11/02/13 2007 11/03/13 0548 11/04/13 0454  NA 139 137 140  K 4.0 4.0 3.8  CL 101 100 108  CO2 25 25 23   GLUCOSE 156* 113* 97  BUN 25* 19 8  CREATININE 0.46* 0.44* 0.43*  CALCIUM 8.8 7.6* 7.3*   Liver Function Tests:  Recent Labs Lab 11/02/13 2007 11/03/13 0548  AST 34 31  ALT 15 13  ALKPHOS 238* 224*  BILITOT 0.3 0.4  PROT 7.4 7.1  ALBUMIN 3.5 3.4*   No results found for  this basename: LIPASE, AMYLASE,  in the last 168 hours No results found for this basename: AMMONIA,  in the last 168 hours CBC:  Recent Labs Lab 11/02/13 2123 11/03/13 0548 11/04/13 0454  WBC 6.4 7.4 6.5  NEUTROABS 2.9 3.0  --   HGB 10.1* 10.0* 9.6*  HCT 30.9* 31.5* 31.0*  MCV 91.7 93.2 96.0  PLT 38* 31* 32*   Signed:  Adith Tejada L  [facility] 11/04/2013, 2:05 PM

## 2013-11-04 NOTE — Telephone Encounter (Signed)
Call to Mr. Raffety to discuss pt's platelets are low she will need transfusion prior to [facility] being inserted.Pt is a Jehovah's witness.  Mr. Landgrebe (pt's husband) verbalized consent for pt to receive platelets on 11/05/13.  Discussed with Mr. Onstad I would make a note in the chart with his verbal consent and he or wife will need to also sign a consent form prior to the platelet infusion.

## 2013-11-04 NOTE — Progress Notes (Signed)
Physical Therapy Treatment Patient Details Name: Gina [name] MRN: [id number] DOB: [birth year] Today's Date: 11/04/2013 Time: 1204-1300 PT Time Calculation (min): 56 min  PT Assessment / Plan / Recommendation  History of Present Illness 67 y.o. female admitted to [facility] on 11/02/13 with history of breast cancer, anemia and thrombocytopenia probably related to breast cancer started experiencing sudden onset of dizziness last evening around 4 PM. Patient felt dizzy only when she tried to walk. On lying down her dizziness resolved. Patient did not have any sensation of palpitations chest pain but did have some nausea patient did not have any focal deficits blurred vision headache diarrhea. In the ER patient was found to be ataxic on trying to walk. CT head did not show anything acute.  MRI pending.     PT Comments   Pt reports mild improvements today compared to yesterday.  Still unsteady and requiring assist when up and walking.  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  Eply's maneuver preformed x 2 again today with improvements seen with repeat testing.  HEP given and reviewed with pt and PT continues to recommend f/u at [facility] for OP Vestibular PT eval.    Follow Up Recommendations  Outpatient PT;Supervision for mobility/OOB (OP Vestibular PT at [facility])     Does the patient have the potential to tolerate intense rehabilitation    NA  Barriers to Discharge   None      Equipment Recommendations  None recommended by PT (pt owns a cane and a RW)    Recommendations for Other Services   None  Frequency Min 3X/week   Progress towards PT Goals Progress towards PT goals: Progressing toward goals   Plan Current plan remains appropriate    Precautions / Restrictions Precautions Precautions: Fall   Pertinent Vitals/Pain See vitals flow sheet.    Mobility  Bed Mobility Supine to Sit: 6: Modified independent (Device/Increase time);With  rails Sitting - Scoot to Edge of Bed: 6: Modified independent (Device/Increase time);With rail Sit to Supine: 6: Modified independent (Device/Increase time);With rail Details for Bed Mobility Assistance: pt able to get to EOB easily with assist of railing.   Transfers Transfers: Sit to Stand;Stand to Sit Sit to Stand: 4: Min guard;With upper extremity assist;With armrests;From bed Stand to Sit: 4: Min guard;With upper extremity assist;With armrests;To chair/3-in-1 Details for Transfer Assistance: min guard assist to steady pt for balance.  Ambulation/Gait Ambulation/Gait Assistance: Not tested (comment) (pt declined)    Exercises Other Exercises Other Exercises: Epley's Maneuver preformed x 2 with pt to try to clear L BPPV posterior canal.   Other Exercises: Tested for right sided BPPV (-) and roll test bil (-) to confirm diagnosis of L posterior canal BPPV.   Other Exercises: x1 exercises given and reviewed with pt with frequency and precautions listed. See copy of HEP in paper chart.      PT Goals (current goals can now be found in the care plan section) Acute Rehab PT Goals Patient Stated Goal: to get back to feeling better, more balanced  Visit Information  Last PT Received On: 11/04/13 Assistance Needed: +1 History of Present Illness: 67 y.o. female admitted to [facility] on 11/02/13 with history of breast cancer, anemia and thrombocytopenia probably related to breast cancer started experiencing sudden onset of dizziness last evening around 4 PM. Patient felt dizzy only when she tried to walk. On lying down her dizziness resolved. Patient did not have any sensation of palpitations chest pain  but did have some nausea patient did not have any focal deficits blurred vision headache diarrhea. In the ER patient was found to be ataxic on trying to walk. CT head did not show anything acute.  MRI pending.      Subjective Data  Subjective: Pt reports still only mild improvements after yesterday's  treatments.  She did not want to walk today because she feels weak and has not eaten since last night because she reports she might have a proceedure today.  Patient Stated Goal: to get back to feeling better, more balanced   Cognition  Cognition Arousal/Alertness: Awake/alert Behavior During Therapy: WFL for tasks assessed/performed Overall Cognitive Status: Within Functional Limits for tasks assessed    Balance  Static Sitting Balance Static Sitting - Balance Support: No upper extremity supported;Feet supported Static Sitting - Level of Assistance: 7: Independent Static Standing Balance Static Standing - Balance Support: Left upper extremity supported Static Standing - Level of Assistance: 4: Min assist Dynamic Standing Balance Dynamic Standing - Balance Support: Left upper extremity supported Dynamic Standing - Level of Assistance: 4: Min assist  End of Session PT - End of Session Activity Tolerance: Patient limited by fatigue;Other (comment) (limited by dizziness and nausea) Patient left: in chair;with call bell/phone within reach     Gina [name], PT, DPT [phone]   11/04/2013, 2:07 PM

## 2013-11-05 ENCOUNTER — Ambulatory Visit (HOSPITAL_COMMUNITY)
Admit: 2013-11-05 | Discharge: 2013-11-05 | Disposition: A | Discharge status: Home / Self Care | Payer: 59 | Attending: Oncology | Admitting: Oncology

## 2013-11-05 ENCOUNTER — Telehealth: Payer: Self-pay | Admitting: *Deleted

## 2013-11-05 ENCOUNTER — Encounter (HOSPITAL_COMMUNITY): Payer: Self-pay

## 2013-11-05 ENCOUNTER — Other Ambulatory Visit: Payer: Self-pay | Admitting: Emergency Medicine

## 2013-11-05 ENCOUNTER — Other Ambulatory Visit: Payer: Self-pay | Admitting: Oncology

## 2013-11-05 DIAGNOSIS — Z79899 Other long term (current) drug therapy: Secondary | ICD-10-CM | POA: Insufficient documentation

## 2013-11-05 DIAGNOSIS — C50919 Malignant neoplasm of unspecified site of unspecified female breast: Secondary | ICD-10-CM

## 2013-11-05 DIAGNOSIS — D696 Thrombocytopenia, unspecified: Secondary | ICD-10-CM | POA: Insufficient documentation

## 2013-11-05 LAB — CBC WITH DIFFERENTIAL/PLATELET
Basophils Absolute: 0.5 10*3/uL — ABNORMAL HIGH (ref 0.0–0.1)
Eosinophils Relative: 2 % (ref 0–5)
MCH: 29.5 pg (ref 26.0–34.0)
MCV: 93 fL (ref 78.0–100.0)
Monocytes Absolute: 1.5 10*3/uL — ABNORMAL HIGH (ref 0.1–1.0)
Monocytes Relative: 21 % — ABNORMAL HIGH (ref 3–12)
Neutrophils Relative %: 43 % (ref 43–77)
Platelets: 34 10*3/uL — ABNORMAL LOW (ref 150–400)
RBC: 3.83 MIL/uL — ABNORMAL LOW (ref 3.87–5.11)
RDW: 17 % — ABNORMAL HIGH (ref 11.5–15.5)
WBC: 7 10*3/uL (ref 4.0–10.5)

## 2013-11-05 LAB — PROTIME-INR: Prothrombin Time: 13.3 seconds (ref 11.6–15.2)

## 2013-11-05 MED ORDER — FENTANYL CITRATE 0.05 MG/ML IJ SOLN
INTRAMUSCULAR | Status: AC
  Filled 2013-11-05: qty 6

## 2013-11-05 MED ORDER — MIDAZOLAM HCL 2 MG/2ML IJ SOLN
INTRAMUSCULAR | Status: AC | PRN
  Administered 2013-11-05 (×2): 1 mg via INTRAVENOUS

## 2013-11-05 MED ORDER — SODIUM CHLORIDE 0.9 % IV SOLN
INTRAVENOUS | Status: DC
  Administered 2013-11-05: 13:00:00 via INTRAVENOUS

## 2013-11-05 MED ORDER — FENTANYL CITRATE 0.05 MG/ML IJ SOLN
INTRAMUSCULAR | Status: AC | PRN
  Administered 2013-11-05 (×2): 50 ug via INTRAVENOUS

## 2013-11-05 MED ORDER — LIDOCAINE HCL 1 % IJ SOLN
INTRAMUSCULAR | Status: AC
  Filled 2013-11-05: qty 20

## 2013-11-05 MED ORDER — HEPARIN SOD (PORK) LOCK FLUSH 100 UNIT/ML IV SOLN
INTRAVENOUS | Status: AC | PRN
  Administered 2013-11-05: 500 [IU]

## 2013-11-05 MED ORDER — MIDAZOLAM HCL 2 MG/2ML IJ SOLN
INTRAMUSCULAR | Status: AC
  Filled 2013-11-05: qty 6

## 2013-11-05 MED ORDER — CEFAZOLIN SODIUM-DEXTROSE 2-3 GM-% IV SOLR
2.0000 g | Freq: Once | INTRAVENOUS | Status: AC
  Administered 2013-11-05: 2 g via INTRAVENOUS
  Filled 2013-11-05: qty 50

## 2013-11-05 MED ORDER — HEPARIN SOD (PORK) LOCK FLUSH 100 UNIT/ML IV SOLN
INTRAVENOUS | Status: AC
  Filled 2013-11-05: qty 5

## 2013-11-05 NOTE — Progress Notes (Signed)
Platelets infusing x 15 minutes. Vital signs assessed and are within normal limits (see flowsheet).  No reaction noted. Platelets infusion rate increased to 220ml/hr. Maeola Harman

## 2013-11-05 NOTE — H&P (Signed)
Chief Complaint: "I'm here for a port" Referring Physician:Khan HPI: Gina [name] is an 67 y.o. female with breast cancer. She is here for port placement. 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 This was confirmed with Dr. Welton Flakes after discussion with the pt's husband. She is scheduled for procedure today with pan for PLT transfusion just prior to. Pt feels ok otherwise, no fevers, chills. Dealing with some positional vertigo.  Past Medical History:  Past Medical History  Diagnosis Date  . Breast cancer 04/28/2013  . Myalgia and myositis, unspecified   . Anemia     Past Surgical History:  Past Surgical History  Procedure Laterality Date  . Mastectomy Right   . Tubal ligation  1971    Family History: No family history on file.  Social History:  reports that she has never smoked. She has never used smokeless tobacco. She reports that she does not drink alcohol or use illicit drugs.  Allergies:  Allergies  Allergen Reactions  . Sudafed [Pseudoephedrine] Anaphylaxis and Anxiety    Medications:   Medication List    ASK your doctor about these medications       anastrozole 1 MG tablet  Commonly known as:  ARIMIDEX  Take 1 tablet (1 mg total) by mouth daily.     cetirizine 10 MG tablet  Commonly known as:  ZYRTEC  Take 10 mg by mouth daily.     dronabinol 2.5 MG capsule  Commonly known as:  MARINOL  Take 2.5 mg by mouth 2 (two) times daily before a meal.     HYDROcodone-acetaminophen 5-325 MG per tablet  Commonly known as:  NORCO/VICODIN  Take 1 tablet by mouth every 6 (six) hours as needed for moderate pain.     [place] Mushroom Powd  5 mLs by Does not apply route daily.     meclizine 25 MG tablet  Commonly known as:  ANTIVERT  Take 1 tablet (25 mg total) by mouth 3 (three) times daily as needed for dizziness.     mirtazapine 15 MG tablet  Commonly known as:  REMERON  Take  7.5 mg by mouth at bedtime.     omeprazole 40 MG capsule  Commonly known as:  PRILOSEC  Take 40 mg by mouth daily.     ondansetron 4 MG tablet  Commonly known as:  ZOFRAN  Take 1 tablet (4 mg total) by mouth every 6 (six) hours as needed for nausea.     traMADol 50 MG tablet  Commonly known as:  ULTRAM  Take 0.5-1 tablets (25-50 mg total) by mouth every 6 (six) hours as needed for pain.     Vitamin D3 5000 UNITS Tabs  Take 5,000 mg by mouth daily.        Please HPI for pertinent positives, otherwise complete 10 system ROS negative.  Physical Exam: BP 117/70  Pulse 92  Temp(Src) 97.8 F (36.6 C) (Oral)  Resp 18 There is no weight on file to calculate BMI.   General Appearance:  Alert, cooperative, no distress, appears stated age  Head:  Normocephalic, without obvious abnormality, atraumatic  ENT: Unremarkable  Neck: Supple, symmetrical, trachea midline  Lungs:   Clear to auscultation bilaterally, no w/r/r  Chest Wall:  No tenderness or deformity. Mastectomy scar well healed.  Heart:  Regular rate and rhythm, S1, S2 normal, no murmur, rub or gallop.  Abdomen:   Soft, non-tender, non distended.  Neurologic: Normal affect, no gross deficits.   Results for orders placed during the hospital encounter of 11/02/13 (from the past 48 hour(s))  BASIC METABOLIC PANEL     Status: Abnormal   Collection Time    11/04/13  4:54 AM      Result Value Range   Sodium 140  135 - 145 mEq/L   Potassium 3.8  3.5 - 5.1 mEq/L   Chloride 108  96 - 112 mEq/L   CO2 23  19 - 32 mEq/L   Glucose, Bld 97  70 - 99 mg/dL   BUN 8  6 - 23 mg/dL   Comment: DELTA CHECK NOTED   Creatinine, Ser 0.43 (*) 0.50 - 1.10 mg/dL   Calcium 7.3 (*) 8.4 - 10.5 mg/dL   GFR calc non Af Amer >90  >90 mL/min   GFR calc Af Amer >90  >90 mL/min   Comment: (NOTE)     The eGFR has been calculated using the CKD EPI equation.     This calculation has not been validated in all clinical situations.     eGFR's persistently  <90 mL/min signify possible Chronic Kidney     Disease.  CBC     Status: Abnormal   Collection Time    11/04/13  4:54 AM      Result Value Range   WBC 6.5  4.0 - 10.5 K/uL   RBC 3.23 (*) 3.87 - 5.11 MIL/uL   Hemoglobin 9.6 (*) 12.0 - 15.0 g/dL   HCT 21.3 (*) 08.6 - 57.8 %   MCV 96.0  78.0 - 100.0 fL   MCH 29.7  26.0 - 34.0 pg   MCHC 31.0  30.0 - 36.0 g/dL   RDW 46.9 (*) 62.9 - 52.8 %   Platelets 32 (*) 150 - 400 K/uL   Comment: CONSISTENT WITH PREVIOUS RESULT   Mr Lodema Pilot Contrast  11/03/2013   CLINICAL DATA:  New onset vertigo.  History of breast cancer.  EXAM: MRI HEAD WITHOUT AND WITH CONTRAST  TECHNIQUE: Multiplanar, multiecho pulse sequences of the brain and surrounding structures were obtained without and with intravenous contrast.  CONTRAST:  10mL MULTIHANCE GADOBENATE DIMEGLUMINE 529 MG/ML IV SOLN  COMPARISON:  11/02/2013 head CT.  10/03/2013 cervical spine MR.  FINDINGS: No acute infarct.  No intracranial hemorrhage.  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 The adjacent dural enhancement is suggested or spread of tumor.  No parenchymal enhancing lesion to suggest brain parenchymal metastatic disease. 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  Mild white matter type changes suggestive of result of small vessel disease.  Partial opacification left petrous apex and left mastoid air cells.  Major intracranial vascular structures are patent.  Cervical medullary junction, pituitary region, pineal region and orbital structures unremarkable.  IMPRESSION: No acute infarct.  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 The adjacent dural enhancement is suggested or spread of tumor.  No parenchymal enhancing lesion to suggest brain parenchymal metastatic disease. 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   Mild white matter type changes suggestive of result of small vessel disease.  Partial opacification left petrous apex and left mastoid air cells.   Electronically Signed   By: Bridgett Larsson M.D.   On: 11/03/2013 19:36    Assessment/Plan Breast cancer Thrombocytopenia For port placement today PLTs are currently transfusing PLT count 34k, other labs ok. Discussed procedure with pt  and family including risks, complications, use of sedation. Consent signed in chart  Brayton El PA-C 11/05/2013, 12:58 PM

## 2013-11-05 NOTE — Procedures (Signed)
R IJ Port cathter placement with US and fluoroscopy No complication No blood loss. See complete dictation in [facility] PACS.  

## 2013-11-05 NOTE — Telephone Encounter (Signed)
Per taff message I have scheduled appt

## 2013-11-06 ENCOUNTER — Ambulatory Visit: Payer: 59 | Admitting: Oncology

## 2013-11-06 ENCOUNTER — Ambulatory Visit (HOSPITAL_BASED_OUTPATIENT_CLINIC_OR_DEPARTMENT_OTHER): Payer: 59

## 2013-11-06 ENCOUNTER — Telehealth: Payer: Self-pay | Admitting: *Deleted

## 2013-11-06 ENCOUNTER — Ambulatory Visit (HOSPITAL_BASED_OUTPATIENT_CLINIC_OR_DEPARTMENT_OTHER): Payer: 59 | Admitting: Oncology

## 2013-11-06 ENCOUNTER — Other Ambulatory Visit (HOSPITAL_BASED_OUTPATIENT_CLINIC_OR_DEPARTMENT_OTHER): Payer: 59

## 2013-11-06 ENCOUNTER — Telehealth: Payer: Self-pay | Admitting: Oncology

## 2013-11-06 ENCOUNTER — Other Ambulatory Visit: Payer: Self-pay | Admitting: *Deleted

## 2013-11-06 ENCOUNTER — Other Ambulatory Visit: Payer: Self-pay | Admitting: Oncology

## 2013-11-06 ENCOUNTER — Ambulatory Visit: Payer: 59

## 2013-11-06 VITALS — BP 140/81 | HR 93 | Temp 97.6°F | Resp 16

## 2013-11-06 VITALS — BP 138/82 | HR 103 | Temp 97.6°F | Resp 17 | Ht 63.0 in | Wt 100.5 lb

## 2013-11-06 DIAGNOSIS — E46 Unspecified protein-calorie malnutrition: Secondary | ICD-10-CM

## 2013-11-06 DIAGNOSIS — D61818 Other pancytopenia: Secondary | ICD-10-CM

## 2013-11-06 DIAGNOSIS — C50919 Malignant neoplasm of unspecified site of unspecified female breast: Secondary | ICD-10-CM

## 2013-11-06 DIAGNOSIS — D696 Thrombocytopenia, unspecified: Secondary | ICD-10-CM

## 2013-11-06 DIAGNOSIS — Z5111 Encounter for antineoplastic chemotherapy: Secondary | ICD-10-CM

## 2013-11-06 DIAGNOSIS — D649 Anemia, unspecified: Secondary | ICD-10-CM

## 2013-11-06 LAB — CBC WITH DIFFERENTIAL/PLATELET
Basophils Absolute: 0.4 10*3/uL — ABNORMAL HIGH (ref 0.0–0.1)
EOS%: 2 % (ref 0.0–7.0)
Eosinophils Absolute: 0.1 10*3/uL (ref 0.0–0.5)
LYMPH%: 28.1 % (ref 14.0–49.7)
MCH: 29.9 pg (ref 25.1–34.0)
MCHC: 32.6 g/dL (ref 31.5–36.0)
MCV: 91.7 fL (ref 79.5–101.0)
MONO#: 1.1 10*3/uL — ABNORMAL HIGH (ref 0.1–0.9)
MONO%: 17.6 % — ABNORMAL HIGH (ref 0.0–14.0)
Platelets: 68 10*3/uL — ABNORMAL LOW (ref 145–400)
RBC: 3.75 10*6/uL (ref 3.70–5.45)
RDW: 16.9 % — ABNORMAL HIGH (ref 11.2–14.5)
WBC: 6 10*3/uL (ref 3.9–10.3)
nRBC: 14 % — ABNORMAL HIGH (ref 0–0)

## 2013-11-06 LAB — PREPARE PLATELET PHERESIS

## 2013-11-06 LAB — COMPREHENSIVE METABOLIC PANEL (CC13)
ALT: 12 U/L (ref 0–55)
Alkaline Phosphatase: 230 U/L — ABNORMAL HIGH (ref 40–150)
Anion Gap: 11 mEq/L (ref 3–11)
BUN: 16.8 mg/dL (ref 7.0–26.0)
CO2: 25 mEq/L (ref 22–29)
Calcium: 9.3 mg/dL (ref 8.4–10.4)
Chloride: 104 mEq/L (ref 98–109)
Creatinine: 0.6 mg/dL (ref 0.6–1.1)
Total Bilirubin: 0.43 mg/dL (ref 0.20–1.20)

## 2013-11-06 LAB — TECHNOLOGIST REVIEW

## 2013-11-06 MED ORDER — SODIUM CHLORIDE 0.9 % IJ SOLN
10.0000 mL | INTRAMUSCULAR | Status: DC | PRN
  Administered 2013-11-06: 10 mL
  Filled 2013-11-06: qty 10

## 2013-11-06 MED ORDER — FAMOTIDINE IN NACL 20-0.9 MG/50ML-% IV SOLN
INTRAVENOUS | Status: AC
  Filled 2013-11-06: qty 50

## 2013-11-06 MED ORDER — DIPHENHYDRAMINE HCL 50 MG/ML IJ SOLN
25.0000 mg | Freq: Once | INTRAMUSCULAR | Status: AC
  Administered 2013-11-06: 25 mg via INTRAVENOUS

## 2013-11-06 MED ORDER — LIDOCAINE-PRILOCAINE 2.5-2.5 % EX CREA
TOPICAL_CREAM | CUTANEOUS | Status: AC
  Filled 2013-11-06: qty 5

## 2013-11-06 MED ORDER — ONDANSETRON 8 MG/NS 50 ML IVPB
INTRAVENOUS | Status: AC
  Filled 2013-11-06: qty 8

## 2013-11-06 MED ORDER — DIPHENHYDRAMINE HCL 50 MG/ML IJ SOLN
INTRAMUSCULAR | Status: AC
  Filled 2013-11-06: qty 1

## 2013-11-06 MED ORDER — ONDANSETRON 8 MG/50ML IVPB (CHCC)
8.0000 mg | Freq: Once | INTRAVENOUS | Status: AC
  Administered 2013-11-06: 8 mg via INTRAVENOUS

## 2013-11-06 MED ORDER — DEXAMETHASONE SODIUM PHOSPHATE 20 MG/5ML IJ SOLN
INTRAMUSCULAR | Status: AC
  Filled 2013-11-06: qty 5

## 2013-11-06 MED ORDER — HEPARIN SOD (PORK) LOCK FLUSH 100 UNIT/ML IV SOLN
500.0000 [IU] | Freq: Once | INTRAVENOUS | Status: AC | PRN
  Administered 2013-11-06: 500 [IU]
  Filled 2013-11-06: qty 5

## 2013-11-06 MED ORDER — DEXAMETHASONE SODIUM PHOSPHATE 20 MG/5ML IJ SOLN
20.0000 mg | Freq: Once | INTRAMUSCULAR | Status: AC
  Administered 2013-11-06: 20 mg via INTRAVENOUS

## 2013-11-06 MED ORDER — FAMOTIDINE IN NACL 20-0.9 MG/50ML-% IV SOLN
20.0000 mg | Freq: Once | INTRAVENOUS | Status: AC
  Administered 2013-11-06: 20 mg via INTRAVENOUS

## 2013-11-06 MED ORDER — SODIUM CHLORIDE 0.9 % IV SOLN
Freq: Once | INTRAVENOUS | Status: AC
  Administered 2013-11-06: 11:00:00 via INTRAVENOUS

## 2013-11-06 MED ORDER — SODIUM CHLORIDE 0.9 % IV SOLN
80.0000 mg/m2 | Freq: Once | INTRAVENOUS | Status: AC
  Administered 2013-11-06: 114 mg via INTRAVENOUS
  Filled 2013-11-06: qty 19

## 2013-11-06 NOTE — Patient Instructions (Addendum)
[facility] Discharge Instructions for Patients Receiving Chemotherapy  Today you received the following chemotherapy agents: Taxol  To help prevent nausea and vomiting after your treatment, we encourage you to take your nausea medication as prescribed.    If you develop nausea and vomiting that is not controlled by your nausea medication, call the clinic.   BELOW ARE SYMPTOMS THAT SHOULD BE REPORTED IMMEDIATELY:  *FEVER GREATER THAN 100.5 F  *CHILLS WITH OR WITHOUT FEVER  NAUSEA AND VOMITING THAT IS NOT CONTROLLED WITH YOUR NAUSEA MEDICATION  *UNUSUAL SHORTNESS OF BREATH  *UNUSUAL BRUISING OR BLEEDING  TENDERNESS IN MOUTH AND THROAT WITH OR WITHOUT PRESENCE OF ULCERS  *URINARY PROBLEMS  *BOWEL PROBLEMS  UNUSUAL RASH Items with * indicate a potential emergency and should be followed up as soon as possible.  Feel free to call the clinic you have any questions or concerns. The clinic phone number is [phone].    Paclitaxel injection - Taxol What is this medicine? PACLITAXEL (PAK li TAX el) is a chemotherapy drug. It targets fast dividing cells, like cancer cells, and causes these cells to die. This medicine is used to treat ovarian cancer, breast cancer, and other cancers. This medicine may be used for other purposes; ask your health care provider or pharmacist if you have questions. COMMON BRAND NAME(S): Onxol , Taxol What should I tell my health care provider before I take this medicine? They need to know if you have any of these conditions: -blood disorders -irregular heartbeat -infection (especially a virus infection such as chickenpox, cold sores, or herpes) -liver disease -previous or ongoing radiation therapy -an unusual or allergic reaction to paclitaxel, alcohol, polyoxyethylated castor oil, other chemotherapy agents, other medicines, foods, dyes, or preservatives -pregnant or trying to get pregnant -breast-feeding How should I use this  medicine? This drug is given as an infusion into a vein. It is administered in a hospital or clinic by a specially trained health care professional. Talk to your pediatrician regarding the use of this medicine in children. Special care may be needed. Overdosage: If you think you have taken too much of this medicine contact a poison control center or emergency room at once. NOTE: This medicine is only for you. Do not share this medicine with others. What if I miss a dose? It is important not to miss your dose. Call your doctor or health care professional if you are unable to keep an appointment. What may interact with this medicine? Do not take this medicine with any of the following medications: -disulfiram -metronidazole This medicine may also interact with the following medications: -cyclosporine -diazepam -ketoconazole -medicines to increase blood counts like filgrastim, pegfilgrastim, sargramostim -other chemotherapy drugs like cisplatin, doxorubicin, epirubicin, etoposide, teniposide, vincristine -quinidine -testosterone -vaccines -verapamil Talk to your doctor or health care professional before taking any of these medicines: -acetaminophen -aspirin -ibuprofen -ketoprofen -naproxen This list may not describe all possible interactions. Give your health care provider a list of all the medicines, herbs, non-prescription drugs, or dietary supplements you use. Also tell them if you smoke, drink alcohol, or use illegal drugs. Some items may interact with your medicine. What should I watch for while using this medicine? Your condition will be monitored carefully while you are receiving this medicine. You will need important blood work done while you are taking this medicine. This drug may make you feel generally unwell. This is not uncommon, as chemotherapy can affect healthy cells as well as cancer cells. Report any side effects.   Continue your course of treatment even though you feel ill  unless your doctor tells you to stop. In some cases, you may be given additional medicines to help with side effects. Follow all directions for their use. Call your doctor or health care professional for advice if you get a fever, chills or sore throat, or other symptoms of a cold or flu. Do not treat yourself. This drug decreases your body's ability to fight infections. Try to avoid being around people who are sick. This medicine may increase your risk to bruise or bleed. Call your doctor or health care professional if you notice any unusual bleeding. Be careful brushing and flossing your teeth or using a toothpick because you may get an infection or bleed more easily. If you have any dental work done, tell your dentist you are receiving this medicine. Avoid taking products that contain aspirin, acetaminophen, ibuprofen, naproxen, or ketoprofen unless instructed by your doctor. These medicines may hide a fever. Do not become pregnant while taking this medicine. Women should inform their doctor if they wish to become pregnant or think they might be pregnant. There is a potential for serious side effects to an unborn child. Talk to your health care professional or pharmacist for more information. Do not breast-feed an infant while taking this medicine. Men are advised not to father a child while receiving this medicine. What side effects may I notice from receiving this medicine? Side effects that you should report to your doctor or health care professional as soon as possible: -allergic reactions like skin rash, itching or hives, swelling of the face, lips, or tongue -low blood counts - This drug may decrease the number of white blood cells, red blood cells and platelets. You may be at increased risk for infections and bleeding. -signs of infection - fever or chills, cough, sore throat, pain or difficulty passing urine -signs of decreased platelets or bleeding - bruising, pinpoint red spots on the skin,  black, tarry stools, nosebleeds -signs of decreased red blood cells - unusually weak or tired, fainting spells, lightheadedness -breathing problems -chest pain -high or low blood pressure -mouth sores -nausea and vomiting -pain, swelling, redness or irritation at the injection site -pain, tingling, numbness in the hands or feet -slow or irregular heartbeat -swelling of the ankle, feet, hands Side effects that usually do not require medical attention (report to your doctor or health care professional if they continue or are bothersome): -bone pain -complete hair loss including hair on your head, underarms, pubic hair, eyebrows, and eyelashes -changes in the color of fingernails -diarrhea -loosening of the fingernails -loss of appetite -muscle or joint pain -red flush to skin -sweating This list may not describe all possible side effects. Call your doctor for medical advice about side effects. You may report side effects to FDA at 1-800-FDA-1088. Where should I keep my medicine? This drug is given in a hospital or clinic and will not be stored at home. NOTE: This sheet is a summary. It may not cover all possible information. If you have questions about this medicine, talk to your doctor, pharmacist, or health care provider.  2014, [employer]. (2013-01-05 16:41:21)  

## 2013-11-06 NOTE — Telephone Encounter (Signed)
appts made and printed. Pt is aware that tx's will be added. i emailed MW to add the tx's...td 

## 2013-11-06 NOTE — Progress Notes (Signed)
Patient says she has had vertigo since Monday, and was given a prescription for Anivert, which she has been unable to pick up. Patient states vertigo is significantly better.Dr.Khan made aware per her desk RN.Ok to treat with platelet count of 68 and current vertigo. per Dr.Khan  Benadryl IV dose reduced from 50 mg to 25 mg ok per Dr.Khan. Pharmacy notified.

## 2013-11-06 NOTE — Telephone Encounter (Signed)
, °

## 2013-11-06 NOTE — Telephone Encounter (Signed)
Pt is already here for labs and tx...td

## 2013-11-09 ENCOUNTER — Telehealth: Payer: Self-pay | Admitting: *Deleted

## 2013-11-09 NOTE — Telephone Encounter (Signed)
Vertigo unchanged-has therapy session tomorrow for this. No complaints. Knows to call for any questions or problems. Aware of her appointment times for Friday.

## 2013-11-09 NOTE — Telephone Encounter (Signed)
Left VM for patient to call office.

## 2013-11-09 NOTE — Telephone Encounter (Signed)
Per staff message and POF I have scheduled appts.  JMW  

## 2013-11-10 ENCOUNTER — Ambulatory Visit: Payer: 59 | Attending: Family Medicine | Admitting: Physical Therapy

## 2013-11-10 DIAGNOSIS — IMO0001 Reserved for inherently not codable concepts without codable children: Secondary | ICD-10-CM | POA: Insufficient documentation

## 2013-11-10 DIAGNOSIS — R269 Unspecified abnormalities of gait and mobility: Secondary | ICD-10-CM | POA: Insufficient documentation

## 2013-11-10 DIAGNOSIS — R42 Dizziness and giddiness: Secondary | ICD-10-CM | POA: Insufficient documentation

## 2013-11-10 DIAGNOSIS — H8110 Benign paroxysmal vertigo, unspecified ear: Secondary | ICD-10-CM | POA: Insufficient documentation

## 2013-11-13 ENCOUNTER — Ambulatory Visit (HOSPITAL_COMMUNITY)
Admission: RE | Admit: 2013-11-13 | Discharge: 2013-11-13 | Disposition: A | Discharge status: Home / Self Care | Payer: 59 | Source: Ambulatory Visit | Attending: Oncology | Admitting: Oncology

## 2013-11-13 ENCOUNTER — Other Ambulatory Visit: Payer: Self-pay | Admitting: Emergency Medicine

## 2013-11-13 ENCOUNTER — Other Ambulatory Visit (HOSPITAL_BASED_OUTPATIENT_CLINIC_OR_DEPARTMENT_OTHER): Payer: 59

## 2013-11-13 ENCOUNTER — Encounter: Payer: Self-pay | Admitting: Nutrition

## 2013-11-13 ENCOUNTER — Ambulatory Visit (HOSPITAL_BASED_OUTPATIENT_CLINIC_OR_DEPARTMENT_OTHER): Payer: 59

## 2013-11-13 ENCOUNTER — Telehealth: Payer: Self-pay | Admitting: *Deleted

## 2013-11-13 VITALS — BP 126/73 | HR 100 | Temp 97.0°F | Resp 16

## 2013-11-13 DIAGNOSIS — C50219 Malignant neoplasm of upper-inner quadrant of unspecified female breast: Secondary | ICD-10-CM

## 2013-11-13 DIAGNOSIS — C50919 Malignant neoplasm of unspecified site of unspecified female breast: Secondary | ICD-10-CM

## 2013-11-13 DIAGNOSIS — C7951 Secondary malignant neoplasm of bone: Secondary | ICD-10-CM

## 2013-11-13 DIAGNOSIS — D759 Disease of blood and blood-forming organs, unspecified: Secondary | ICD-10-CM | POA: Insufficient documentation

## 2013-11-13 DIAGNOSIS — C50319 Malignant neoplasm of lower-inner quadrant of unspecified female breast: Secondary | ICD-10-CM

## 2013-11-13 DIAGNOSIS — Z5111 Encounter for antineoplastic chemotherapy: Secondary | ICD-10-CM

## 2013-11-13 LAB — CBC WITH DIFFERENTIAL/PLATELET
BASO%: 8 % — ABNORMAL HIGH (ref 0.0–2.0)
Basophils Absolute: 0.6 10*3/uL — ABNORMAL HIGH (ref 0.0–0.1)
Eosinophils Absolute: 0.2 10*3/uL (ref 0.0–0.5)
HGB: 11.4 g/dL — ABNORMAL LOW (ref 11.6–15.9)
MCV: 92.4 fL (ref 79.5–101.0)
NEUT#: 2.5 10*3/uL (ref 1.5–6.5)
RDW: 16.7 % — ABNORMAL HIGH (ref 11.2–14.5)
WBC: 7 10*3/uL (ref 3.9–10.3)
lymph#: 2.3 10*3/uL (ref 0.9–3.3)

## 2013-11-13 LAB — COMPREHENSIVE METABOLIC PANEL (CC13)
AST: 50 U/L — ABNORMAL HIGH (ref 5–34)
Anion Gap: 12 mEq/L — ABNORMAL HIGH (ref 3–11)
BUN: 20.3 mg/dL (ref 7.0–26.0)
Calcium: 9.6 mg/dL (ref 8.4–10.4)
Chloride: 103 mEq/L (ref 98–109)
Creatinine: 0.7 mg/dL (ref 0.6–1.1)
Potassium: 4.6 mEq/L (ref 3.5–5.1)
Sodium: 140 mEq/L (ref 136–145)
Total Protein: 7.4 g/dL (ref 6.4–8.3)

## 2013-11-13 LAB — TECHNOLOGIST REVIEW

## 2013-11-13 MED ORDER — DEXAMETHASONE SODIUM PHOSPHATE 20 MG/5ML IJ SOLN
INTRAMUSCULAR | Status: AC
  Filled 2013-11-13: qty 5

## 2013-11-13 MED ORDER — SODIUM CHLORIDE 0.9 % IJ SOLN
10.0000 mL | INTRAMUSCULAR | Status: DC | PRN
  Administered 2013-11-13: 10 mL
  Filled 2013-11-13: qty 10

## 2013-11-13 MED ORDER — DEXAMETHASONE SODIUM PHOSPHATE 20 MG/5ML IJ SOLN
20.0000 mg | Freq: Once | INTRAMUSCULAR | Status: AC
  Administered 2013-11-13: 20 mg via INTRAVENOUS

## 2013-11-13 MED ORDER — DIPHENHYDRAMINE HCL 25 MG PO CAPS: 25.0000 mg | ORAL_CAPSULE | Freq: Once | ORAL | Status: DC

## 2013-11-13 MED ORDER — FAMOTIDINE IN NACL 20-0.9 MG/50ML-% IV SOLN
INTRAVENOUS | Status: AC
  Filled 2013-11-13: qty 50

## 2013-11-13 MED ORDER — ACETAMINOPHEN 325 MG PO TABS
650.0000 mg | ORAL_TABLET | Freq: Once | ORAL | Status: AC
  Administered 2013-11-13: 650 mg via ORAL

## 2013-11-13 MED ORDER — PACLITAXEL CHEMO INJECTION 300 MG/50ML
80.0000 mg/m2 | Freq: Once | INTRAVENOUS | Status: AC
  Administered 2013-11-13: 114 mg via INTRAVENOUS
  Filled 2013-11-13: qty 19

## 2013-11-13 MED ORDER — ONDANSETRON 8 MG/50ML IVPB (CHCC)
8.0000 mg | Freq: Once | INTRAVENOUS | Status: AC
  Administered 2013-11-13: 8 mg via INTRAVENOUS

## 2013-11-13 MED ORDER — ACETAMINOPHEN 325 MG PO TABS
ORAL_TABLET | ORAL | Status: AC
  Filled 2013-11-13: qty 2

## 2013-11-13 MED ORDER — DIPHENHYDRAMINE HCL 50 MG/ML IJ SOLN
INTRAMUSCULAR | Status: AC
  Filled 2013-11-13: qty 1

## 2013-11-13 MED ORDER — FAMOTIDINE IN NACL 20-0.9 MG/50ML-% IV SOLN
20.0000 mg | Freq: Once | INTRAVENOUS | Status: AC
  Administered 2013-11-13: 20 mg via INTRAVENOUS

## 2013-11-13 MED ORDER — ONDANSETRON 8 MG/NS 50 ML IVPB
INTRAVENOUS | Status: AC
  Filled 2013-11-13: qty 8

## 2013-11-13 MED ORDER — HEPARIN SOD (PORK) LOCK FLUSH 100 UNIT/ML IV SOLN
500.0000 [IU] | Freq: Once | INTRAVENOUS | Status: AC | PRN
  Administered 2013-11-13: 500 [IU]
  Filled 2013-11-13: qty 5

## 2013-11-13 MED ORDER — SODIUM CHLORIDE 0.9 % IV SOLN
Freq: Once | INTRAVENOUS | Status: AC
  Administered 2013-11-13: 12:00:00 via INTRAVENOUS

## 2013-11-13 MED ORDER — LIDOCAINE-PRILOCAINE 2.5-2.5 % EX CREA: 1.0000 "application " | TOPICAL_CREAM | CUTANEOUS | Status: AC | PRN

## 2013-11-13 MED ORDER — SODIUM CHLORIDE 0.9 % IV SOLN
1000.0000 mL | Freq: Once | INTRAVENOUS | Status: AC
  Administered 2013-11-13: 1000 mL via INTRAVENOUS

## 2013-11-13 MED ORDER — DIPHENHYDRAMINE HCL 50 MG/ML IJ SOLN
25.0000 mg | Freq: Once | INTRAMUSCULAR | Status: AC
  Administered 2013-11-13: 25 mg via INTRAVENOUS

## 2013-11-13 NOTE — Progress Notes (Unsigned)
Hold Taxol today for platelets of 27 per Dr Darnelle Catalan.

## 2013-11-13 NOTE — Patient Instructions (Signed)
[facility] Discharge Instructions for Patients Receiving Chemotherapy  Today you received the following chemotherapy agents: Taxol  To help prevent nausea and vomiting after your treatment, we encourage you to take your nausea medication as prescribed.   If you develop nausea and vomiting that is not controlled by your nausea medication, call the clinic.   BELOW ARE SYMPTOMS THAT SHOULD BE REPORTED IMMEDIATELY:  *FEVER GREATER THAN 100.5 F  *CHILLS WITH OR WITHOUT FEVER  NAUSEA AND VOMITING THAT IS NOT CONTROLLED WITH YOUR NAUSEA MEDICATION  *UNUSUAL SHORTNESS OF BREATH  *UNUSUAL BRUISING OR BLEEDING  TENDERNESS IN MOUTH AND THROAT WITH OR WITHOUT PRESENCE OF ULCERS  *URINARY PROBLEMS  *BOWEL PROBLEMS  UNUSUAL RASH Items with * indicate a potential emergency and should be followed up as soon as possible.  Feel free to call the clinic you have any questions or concerns. The clinic phone number is [phone].  Platelet Transfusion Information This is information about transfusions of platelets. Platelets are tiny cells made by the bone marrow and found in the blood. When a blood vessel is damaged platelets rush to the damaged area to help form a clot. This begins the healing process. When platelets get very low your blood may have trouble clotting. This may be from:  Illness.  Blood disorder.  Chemotherapy to treat cancer. Often lower platelet counts do not usually cause problems.  Platelets usually last for 7 to 10 days. If they are not used not used in an injury, they are broken down by the liver or spleen. Symptoms of low platelet count include:  Nosebleeds.  Bleeding gums.  Heavy periods.  Bruising and tiny blood spots in the skin.  Pin point spots of bleeding are called (petechiae).  Larger bruises (purpura).  Bleeding can be more serious if it happens in the brain or bowel. Platelet transfusions are often used to keep the platelet count  at an acceptable level. Serious bleeding due to low platelets is uncommon. RISKS AND COMPLICATIONS Severe side effects from platelet transfusions are uncommon. Minor reactions may include:  Itching.  Rashes.  High temperature and shivering. Medications are available to stop transfusion reactions. Let your caregivers know if you develop any of the above problems.  If you are having platelet transfusions frequently they may get less effective. This is called becoming refractory to platelets. It is uncommon. This can happen from non-immune causes and immune causes. Non-immune causes include:  High temperatures.  Some medications.  An enlarged spleen. Immune causes happen when your body discovers the platelets are not your own and begin making antibodies against them. The antibodies kill the platelets quickly. Even with platelet transfusions you may still notice problems with bleeding or bruising. Let your caregivers know about this. Other things can be done to help if this happens.  BEFORE THE PROCEDURE   Your doctors will check your platelet count regularly.  If the platelet count is too low it may be necessary to have a platelet transfusion.  This is more important before certain procedures with a risk of bleeding such as a spinal tap.  Platelet transfusion reduces the risk of bleeding during or after the procedure.  Except in emergencies, giving a transfusion requires a written consent. Before blood is taken from a donor, a complete history is taken to make sure the person has no history of previous diseases, nor engages in risky social behavior. Examples of this are intravenous drug use or sexual activity with multiple partners. This could  lead to infected blood or blood products being used. This history is done even in spite of the extensive testing to make sure the blood is safe. All blood products transfused are tested to make sure it is a match for the person getting the blood. It  is also checked for infections. Blood is the safest it has ever been. The risk of getting an infection is very low. PROCEDURE  The platelets are stored in small plastic bags which are kept at a low temperature.  Each bag is called a unit and sometimes two units are given. They are given through an intravenous line by drip infusion over about one half hour.  Usually blood is collected from multiple people to get enough to transfuse.  Sometimes, the platelets are collected from a single person. This is done using a special machine that separates the platelets from the blood. The machine is called an apheresis machine. Platelets collected in this way are called apheresed platelets. Apheresed platelets reduce the risk of becoming sensitive to the platelets. This lowers the chances of having a transfusion reaction.  As it only takes a short time to give the platelets, this treatment can be given in an outpatients department. Platelets can also be given before or after other treatments. SEEK IMMEDIATE MEDICAL CARE IF: Any of the following symptoms over the next 12 hours or several days:  Shaking chills.  Fever with a temperature greater than 102 F (38.9 C) develops.  Back pain or muscle pain.  People around you feel you are not acting correctly, or you are confused.  Blood in the urine or bowel movements or bleeding from any place in your body.  Shortness of breath, or difficulty breathing.  Dizziness.  Fainting.  You break out in a rash or develop hives.  You have a decrease in the amount of urine you are putting out, or the urine turns a dark color or changes to pink, red, or brown.  A severe headache or stiff neck.  Bruising more easily. Document Released: 09/09/2007 Document Revised: 02/04/2012 Document Reviewed: 09/09/2007 [facility] Patient Information 2014 [place], [place].

## 2013-11-13 NOTE — Progress Notes (Signed)
Brief follow up with patient's family.  They are asking questions about oral nutrition supplement, Enu.  I had provided a sample to patient in the past.  Patient likes the supplement. Family concerned with patient's continued weight loss and poor appetite.  They want a high calorie, high protein supplement since patient has had decreased intake.  Provided additional samples today along with ordering information at family request.  Family appreciative.

## 2013-11-13 NOTE — Progress Notes (Signed)
Per Dr Welton Flakes, ok to treat with Taxol. Will schedule CBC and Platelet infusion for 12/22.

## 2013-11-13 NOTE — Progress Notes (Signed)
OFFICE PROGRESS NOTE  CC  Gina Hoff, MD [address], [address] [place] [address] Dr. 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 Diagnosed in October 2007  PRIOR THERAPY:  #1 patient presented with a mass in her right breast in October 2007. She underwent bilateral mammograms. The right breast ultrasound performed on 08/19/2006 showed increased density and distortion in the subareolar location of the right breast. Ultrasound showed abnormal echoes in the subareolar location which was a change measuring 1.5 cm. Patient underwent an ultrasound-guided biopsy on 08/23/2006 which showed invasive mammary carcinoma with lobular features. ER +100% PR 3% proliferation marker Ki-67 12% HER-2/neu 2+ by fish. MRI of the breasts on 08/30/2006 showed an ill-defined enhancing mass in the right breast measuring 2.6 x 4.0 x 4.1 cm. Enhancement trailed out to the nipple but did not involve the nipple. There was also a small enhancing internal mammary node measuring 7 mm suspicious for metastatic disease. Left breast was negative.  #2 patient was seen by Dr. Beatris Ship on 09/09/2006. She received neoadjuvant chemotherapy initially consisting of FEC regimen given dose dense for 4 cycles followed by Taxotere. She received her chemotherapy she received her chemotherapy from 09/20/2006 through February 2008. She then went on to lumpectomy on 02/07/2007. However there was essentially residual tumor dispersed over an area 1.8 cm with some close surgical margins at the medial area. Her case was reviewed at the breast conference and it was suggested she undergo mastectomy. Patient underwent a mastectomy on 03/19/2007.  #3 she was subsequently seen by radiation oncology for consideration of post mastectomy radiation therapy. However, she was not given radiation.  #4 she was then begun on Femara 2.5  mg daily. She has now completed 5 years of therapy. It was discontinued in June 2014.    #5 Returned to clinic on 08/05/13 for evaluation and management of new anemia.  She has underwent PET/CT chest abdomen and pelvis which reveals diffuse lytic lesions throughout the axial and appendicular skeleton.  Bone marrow biopsy was consistent with metastatic ER/PR positive breast cancer.    #6 status post Arimidex  Unfortunately she had progressive disease. Patient sought second opinion at [facility] in [place]. They have recommended single agent Taxol.  CURRENT THERAPY: Taxol weekly/Xgeva  INTERVAL HISTORY: Gina [name] 67 y.o. female returns for evaluation. She has been to [facility] in [place]. They recommended single agent chemotherapy with Taxol given on a weekly basis. We discussed this today. Patient also was recently hospitalized for dizziness. During that time she was found to be pretty anemic and thrombocytopenic. She still is thrombocytopenic. She looks pretty good. She has not had any bleeding problems. She denies any nausea or vomiting. No fevers chills or night sweats. She is weak tired and fatigued. Patient's family does tell me she is not eating as well and I have encouraged her to continue to eat. [name] is negative.   MEDICAL HISTORY: Past Medical History  Diagnosis Date  . Breast cancer 04/28/2013  . Myalgia and myositis, unspecified   . Anemia     ALLERGIES:  is allergic to sudafed.  MEDICATIONS:  Current Outpatient Prescriptions  Medication Sig Dispense Refill  . anastrozole (ARIMIDEX) 1 MG tablet Take 1 tablet (1 mg total) by mouth daily.  30 tablet  3  . cetirizine (ZYRTEC) 10 MG tablet Take  10 mg by mouth daily.        . Cholecalciferol (VITAMIN D3) 5000 UNITS TABS Take 5,000 mg by mouth daily.      Marland Kitchen dronabinol (MARINOL) 2.5 MG capsule Take 2.5 mg by mouth 2 (two) times daily before a meal.      .  HYDROcodone-acetaminophen (NORCO/VICODIN) 5-325 MG per tablet Take 1 tablet by mouth every 6 (six) hours as needed for moderate pain.      Carlene Coria Mushroom POWD 5 mLs by Does not apply route daily.      . meclizine (ANTIVERT) 25 MG tablet Take 1 tablet (25 mg total) by mouth 3 (three) times daily as needed for dizziness.  30 tablet  0  . mirtazapine (REMERON) 15 MG tablet Take 7.5 mg by mouth at bedtime.      Marland Kitchen omeprazole (PRILOSEC) 40 MG capsule Take 40 mg by mouth daily.      . ondansetron (ZOFRAN) 4 MG tablet Take 1 tablet (4 mg total) by mouth every 6 (six) hours as needed for nausea.  20 tablet  0  . traMADol (ULTRAM) 50 MG tablet Take 0.5-1 tablets (25-50 mg total) by mouth every 6 (six) hours as needed for pain.  120 tablet  0   No current facility-administered medications for this visit.    SURGICAL HISTORY:  Past Surgical History  Procedure Laterality Date  . Mastectomy Right   . Tubal ligation  1971    REVIEW OF SYSTEMS:  A 10 point review of systems was conducted and is otherwise negative except for what is noted above.    PHYSICAL EXAMINATION: Blood pressure 138/82, pulse 103, temperature 97.6 F (36.4 C), temperature source Oral, resp. rate 17, height 5\' 3"  (1.6 m), weight 100 lb 8 oz (45.587 kg). Body mass index is 17.81 kg/(m^2). General: Patient is a well appearing female, in wheelchair, improved from last exam, weight improved by 4 lbs HEENT: PERRLA, sclerae anicteric, conjunctival pallor, MMM Neck: supple, no palpable adenopathy Lungs: clear to auscultation bilaterally, no wheezes, rhonchi, or rales Cardiovascular: regular rate rhythm, S1, S2, no murmurs, rubs or gallops Abdomen: Soft, non-tender, non-distended, normoactive bowel sounds, no HSM Extremities: warm and well perfused, no clubbing, cyanosis, or edema Skin:  No rashes or lesions Neuro: Non-focal Breasts: deferred ECOG PERFORMANCE STATUS: 0 - Asymptomatic  LABORATORY DATA: Lab Results  Component  Value Date   WBC 6.0 11/06/2013   HGB 11.2* 11/06/2013   HCT 34.4* 11/06/2013   MCV 91.7 11/06/2013   PLT 68* 11/06/2013      Chemistry      Component Value Date/Time   NA 139 11/06/2013 0945   NA 140 11/04/2013 0454   K 4.3 11/06/2013 0945   K 3.8 11/04/2013 0454   CL 108 11/04/2013 0454   CL 105 04/17/2013 1323   CO2 25 11/06/2013 0945   CO2 23 11/04/2013 0454   BUN 16.8 11/06/2013 0945   BUN 8 11/04/2013 0454   CREATININE 0.6 11/06/2013 0945   CREATININE 0.43* 11/04/2013 0454      Component Value Date/Time   CALCIUM 9.3 11/06/2013 0945   CALCIUM 7.3* 11/04/2013 0454   ALKPHOS 230* 11/06/2013 0945   ALKPHOS 224* 11/03/2013 0548   AST 34 11/06/2013 0945   AST 31 11/03/2013 0548   ALT 12 11/06/2013 0945   ALT 13 11/03/2013 0548   BILITOT 0.43 11/06/2013 0945   BILITOT 0.4 11/03/2013 0548    REPORT OF SURGICAL PATHOLOGY  Case #: [id number] Patient Name:  [name], [name] Office Chart Number: N/A  MRN: [id number] Pathologist: Havery Moros, MD DOB/Age [birth year] (Age: 56) Gender: F Date Taken: 02/07/2007 Date Received: 02/07/2007  FINAL DIAGNOSIS  MICROSCOPIC EXAMINATION AND DIAGNOSIS  1. RIGHT AXILLARY SENTINEL LYMPH NODE, EXCISION: - METASTATIC CARCINOMA CONSISTENT WITH BREAST PRIMARY, SEE COMMENT.  2. RIGHT BREAST, NEEDLE LOCALIZATION BIOPSY: - RESIDUAL CARCINOMA IN TUMOR REGRESSION AREA. - CARCINOMA IS CLOSE TO MEDIAL AND LATERAL SURGICAL MARGINS OF RESECTION. - SEE ONCOLOGY TABLE  COMMENT 1. The sections show metastatic carcinoma on H The tumor is seen in the form of dispersed atypical epithelioid cells in a fibrotic background involving an area more than 0.2 cm consistent with metastatic carcinoma. No extranodal extension is identified. For confirmation, Cytokeratin stain (AE1/AE3) was performed and is positive. The control stained appropriately.  ONCOLOGY TABLE-BREAST, INCISIONAL/EXCISIONAL BIOPSY OR MASTECTOMY WITH LYMPH NODES  1. Maximum tumor size  (cm): Dispersed tumor cells in a tumor regression area measuring 1.8 cm, see comment 2. Margins: Tumor cells are seen less than 0.1 cm from the surgical margins of resection Invasive component, distance to closest margin: Less than 0.1 cm In situ component, distance to closest margin: N/A 3. Vascular/Lymphatic invasion: Yes 4. Histology, invasive component: Invasive mammary carcinoma consistent with lobular carcinoma 5. Grade, invasive component (Elston-Ellis modified Scarff-Bloom-Richardson): II Tubule formation grade: 3 Nuclear pleomorphism grade: 2 Mitotic grade: 1 6. Extensive intraductal component (JCRT): No 7. Histology, in situ component: N/A 8. Grade, in situ component: N/A 9. Multicentric (separate tumors in different quadrants): Unknown 10. Multifocal (separate tumors in same quadrant or biopsy): No 11. Axillary lymph nodes: #examined: 1 #with metastasis: 1 ITC (isolated tumor cells, < 0.53mm): 0 Micrometastasis (> 0.58mm, < 2mm): 0 Metastasis > 2 mm: 1 Extracapsular extension: No 12. TNM Code: ypT1, pN1a, pMX 13. Breast Prognostic Markers: Case number [id number] Estrogen receptor positive (100%) Progesterone receptor positive (3%) Ki 67 (Mib-1) 12% Her 2 neu (HercepTest) 2+ Her 2 neu by FISH N/A 14. Non-neoplastic breast: Fibrosis 15. Comments: Sections of the localized tissue show a fibrotic area measuring 1.8 cm and displaying dispersed atypical epithelioid cells throughout that area. Some of the cells surround the benign appearing ductal elements. The findings are subtle and most compatible with residual carcinoma. For confirmation cytokeratin stains were performed on several blocks representing the localized tissue and show strong positivity in previously described epithelioid cells confirming the morphologic impression of residual carcinoma. The carcinoma is less than 0.1 cm from the medial surgical margin of resection and approximately 0.1 cm from the  lateral surgical margin of resection. Since the findings are subtle and the tumor is essentially composed of dispersed cells throughout, the status of the medial and lateral margins in particular may be questionable and hence truly negative margins cannot be assured. I strongly recommend clinical correlation and followup. (BNS:caf 02/12/07)  RADIOGRAPHIC STUDIES:  No results found.  ASSESSMENT: 67 year old female with  #1 history of stage II invasive mammary carcinoma with lobular features originally diagnosed in 2007. She underwent neoadjuvant chemotherapy consisting of 4 cycles of dose dense FEC followed by dose dense Taxotere for 4 cycles. She then had a lumpectomy that revealed residual disease. She proceeded on to have a mastectomy. She was not a candidate for radiation therapy even though she did have an evaluation by radiation oncology.  #2 Dr. Donnie Coffin placed her on Femara 2.5 mg daily she overall tolerated it well. She completed 5 years of Femara. She discontinued this on 04/28/2013.  #3 Anemia work up started  on 08/05/13.  After CT chest abd pelvis, PET scan, and bone marrow biopsy, patient was found to have metastatic breast cancer to the bone.  She will receive Xgeva every four weeks and take Arimidex daily.    #4 patient will begin Taxol 80 mg per meter squared q. 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 Risks benefits and side effects of treatment were discussed with the patient.  #2 she will return in one week's time for week #2 of Taxol. However if the platelet counts are less than 30,000 we may have to hold it.but we will see. Also for her anemia she may require periodic transfusions  #3 protein calorie malnutrition: Referral to dietitian.  4 I have discussed anti-emetics with them in detail.  All questions were  answered. The patient knows to call the clinic with any problems, questions or concerns. We can certainly see the patient much sooner if necessary.  I spent 25 minutes counseling the patient face to face. The total time spent in the appointment was 30 minutes.    Drue Second, MD Medical/Oncology [facility] [phone] (beeper) [phone] (Office)

## 2013-11-13 NOTE — Telephone Encounter (Signed)
Per staff message and POF I have scheduled appts.  JMW  

## 2013-11-13 NOTE — Progress Notes (Signed)
Patient receiving Taxol today with platelet count 27 today per Dr. Welton Flakes. Pt and husband adamantly wanting to receive treatment today. Patient and husband educated about risks of receiving chemotherapy including sudden risk of bleeding, stroke and intercranial bleeding. Patient states that she understands risk and wants to proceed with taxol today. Witnesses to conversation include Reesa Chew, RN, Adella Hare, RN. Both Gladis Riffle (Facilities manager) and Chrystie Nose, RN aware of situation. Before releasing any treatment orders or hanging chemotherapy this RN asked again if PATIENT WANTED TO PROCEED WITH TREATMENT TODAY? Both times the answer was yes. Patient agreeable to receiving platelet transfusion today as well. MD on call - Dr. Bertis Ruddy notified by this RN that patient being treated today. Before discharging patient, this RN educated patient and husband that if patient had any problems or sudden bleeding over the weekend to go directly to the emergency room. Both agreeable. Taxol treatment and platelets given without any complications.  Angelena Form, RN

## 2013-11-15 LAB — PREPARE PLATELET PHERESIS: Unit division: 0

## 2013-11-16 ENCOUNTER — Other Ambulatory Visit: Payer: Self-pay | Admitting: Emergency Medicine

## 2013-11-16 ENCOUNTER — Ambulatory Visit: Payer: 59 | Admitting: Physical Therapy

## 2013-11-16 ENCOUNTER — Other Ambulatory Visit: Payer: Self-pay | Admitting: Adult Health

## 2013-11-16 ENCOUNTER — Ambulatory Visit: Payer: 59

## 2013-11-16 ENCOUNTER — Other Ambulatory Visit (HOSPITAL_BASED_OUTPATIENT_CLINIC_OR_DEPARTMENT_OTHER): Payer: 59

## 2013-11-16 DIAGNOSIS — C50919 Malignant neoplasm of unspecified site of unspecified female breast: Secondary | ICD-10-CM

## 2013-11-16 LAB — MANUAL DIFFERENTIAL
ALC: 1.9 10*3/uL (ref 0.9–3.3)
ANC (CHCC manual diff): 2.3 10*3/uL (ref 1.5–6.5)
Band Neutrophils: 10 % (ref 0–10)
Basophil: 3 % — ABNORMAL HIGH (ref 0–2)
Blasts: 1 % — ABNORMAL HIGH (ref 0–0)
EOS: 1 % (ref 0–7)
LYMPH: 41 % (ref 14–49)
MONO: 3 % (ref 0–14)
Metamyelocytes: 10 % — ABNORMAL HIGH (ref 0–0)
Myelocytes: 11 % — ABNORMAL HIGH (ref 0–0)
Other Cell: 0 % (ref 0–0)
PLT EST: DECREASED
PROMYELO: 1 % — ABNORMAL HIGH (ref 0–0)
SEG: 19 % — ABNORMAL LOW (ref 38–77)
Variant Lymph: 0 % (ref 0–0)
nRBC: 9 % — ABNORMAL HIGH (ref 0–0)

## 2013-11-16 LAB — CBC WITH DIFFERENTIAL/PLATELET
HCT: 34.3 % — ABNORMAL LOW (ref 34.8–46.6)
HGB: 11 g/dL — ABNORMAL LOW (ref 11.6–15.9)
MCH: 29.7 pg (ref 25.1–34.0)
MCHC: 32.1 g/dL (ref 31.5–36.0)
MCV: 92.7 fL (ref 79.5–101.0)
Platelets: 44 10*3/uL — ABNORMAL LOW (ref 145–400)
RBC: 3.7 10*6/uL (ref 3.70–5.45)
RDW: 16.7 % — ABNORMAL HIGH (ref 11.2–14.5)
WBC: 4.6 10*3/uL (ref 3.9–10.3)

## 2013-11-16 NOTE — Progress Notes (Signed)
Spoke with patient; denies any complaints; denies any active bleeding. Advised patient no need for platelets today and will return on 12/26 at 0915 for labs and possible infusion. Instructed patient to call for any concerns. Patient and patient's sister verbalized understanding.

## 2013-11-16 NOTE — Progress Notes (Signed)
Platelet counts 44 today.  Per Sharyl Nimrod, RN,  Pt does not need transfusion today.

## 2013-11-17 ENCOUNTER — Ambulatory Visit: Payer: 59 | Admitting: Physical Therapy

## 2013-11-18 ENCOUNTER — Other Ambulatory Visit: Payer: Self-pay | Admitting: *Deleted

## 2013-11-18 DIAGNOSIS — C50911 Malignant neoplasm of unspecified site of right female breast: Secondary | ICD-10-CM

## 2013-11-20 ENCOUNTER — Ambulatory Visit (HOSPITAL_BASED_OUTPATIENT_CLINIC_OR_DEPARTMENT_OTHER): Payer: 59 | Admitting: Oncology

## 2013-11-20 ENCOUNTER — Telehealth: Payer: Self-pay | Admitting: *Deleted

## 2013-11-20 ENCOUNTER — Telehealth: Payer: Self-pay | Admitting: Medical Oncology

## 2013-11-20 ENCOUNTER — Encounter: Payer: Self-pay | Admitting: Oncology

## 2013-11-20 ENCOUNTER — Ambulatory Visit (HOSPITAL_BASED_OUTPATIENT_CLINIC_OR_DEPARTMENT_OTHER): Payer: 59

## 2013-11-20 ENCOUNTER — Encounter: Payer: Self-pay | Admitting: *Deleted

## 2013-11-20 ENCOUNTER — Other Ambulatory Visit (HOSPITAL_BASED_OUTPATIENT_CLINIC_OR_DEPARTMENT_OTHER): Payer: 59

## 2013-11-20 VITALS — BP 112/73 | HR 99 | Temp 98.0°F | Resp 16

## 2013-11-20 DIAGNOSIS — C50919 Malignant neoplasm of unspecified site of unspecified female breast: Secondary | ICD-10-CM

## 2013-11-20 DIAGNOSIS — D649 Anemia, unspecified: Secondary | ICD-10-CM

## 2013-11-20 DIAGNOSIS — C50319 Malignant neoplasm of lower-inner quadrant of unspecified female breast: Secondary | ICD-10-CM

## 2013-11-20 DIAGNOSIS — C50219 Malignant neoplasm of upper-inner quadrant of unspecified female breast: Secondary | ICD-10-CM

## 2013-11-20 DIAGNOSIS — Z5111 Encounter for antineoplastic chemotherapy: Secondary | ICD-10-CM

## 2013-11-20 DIAGNOSIS — D696 Thrombocytopenia, unspecified: Secondary | ICD-10-CM

## 2013-11-20 DIAGNOSIS — C50911 Malignant neoplasm of unspecified site of right female breast: Secondary | ICD-10-CM

## 2013-11-20 DIAGNOSIS — C7951 Secondary malignant neoplasm of bone: Secondary | ICD-10-CM

## 2013-11-20 LAB — COMPREHENSIVE METABOLIC PANEL (CC13)
ALT: 19 U/L (ref 0–55)
AST: 88 U/L — ABNORMAL HIGH (ref 5–34)
Anion Gap: 10 mEq/L (ref 3–11)
BUN: 18 mg/dL (ref 7.0–26.0)
Calcium: 9.1 mg/dL (ref 8.4–10.4)
Creatinine: 0.7 mg/dL (ref 0.6–1.1)
Total Bilirubin: 0.49 mg/dL (ref 0.20–1.20)

## 2013-11-20 LAB — MANUAL DIFFERENTIAL
ALC: 1.2 10*3/uL (ref 0.9–3.3)
ANC (CHCC manual diff): 2.3 10*3/uL (ref 1.5–6.5)
Band Neutrophils: 8 % (ref 0–10)
LYMPH: 25 % (ref 14–49)
MONO: 17 % — ABNORMAL HIGH (ref 0–14)
Metamyelocytes: 5 % — ABNORMAL HIGH (ref 0–0)
Myelocytes: 10 % — ABNORMAL HIGH (ref 0–0)
PLT EST: DECREASED
nRBC: 21 % — ABNORMAL HIGH (ref 0–0)

## 2013-11-20 LAB — CBC WITH DIFFERENTIAL/PLATELET
HCT: 32.1 % — ABNORMAL LOW (ref 34.8–46.6)
HGB: 10.5 g/dL — ABNORMAL LOW (ref 11.6–15.9)
MCH: 29.7 pg (ref 25.1–34.0)
MCHC: 32.7 g/dL (ref 31.5–36.0)
Platelets: 26 10*3/uL — ABNORMAL LOW (ref 145–400)
RBC: 3.53 10*6/uL — ABNORMAL LOW (ref 3.70–5.45)
WBC: 4.7 10*3/uL (ref 3.9–10.3)

## 2013-11-20 MED ORDER — ONDANSETRON 8 MG/NS 50 ML IVPB
INTRAVENOUS | Status: AC
  Filled 2013-11-20: qty 8

## 2013-11-20 MED ORDER — DEXAMETHASONE SODIUM PHOSPHATE 20 MG/5ML IJ SOLN
20.0000 mg | Freq: Once | INTRAMUSCULAR | Status: AC
  Administered 2013-11-20: 20 mg via INTRAVENOUS

## 2013-11-20 MED ORDER — FAMOTIDINE IN NACL 20-0.9 MG/50ML-% IV SOLN
20.0000 mg | Freq: Once | INTRAVENOUS | Status: AC
  Administered 2013-11-20: 20 mg via INTRAVENOUS

## 2013-11-20 MED ORDER — SODIUM CHLORIDE 0.9 % IV SOLN
40.0000 mg/m2 | Freq: Once | INTRAVENOUS | Status: AC
  Administered 2013-11-20: 54 mg via INTRAVENOUS
  Filled 2013-11-20: qty 9

## 2013-11-20 MED ORDER — ONDANSETRON 16 MG/50ML IVPB (CHCC)
INTRAVENOUS | Status: AC
  Filled 2013-11-20: qty 16

## 2013-11-20 MED ORDER — ACETAMINOPHEN 325 MG PO TABS
650.0000 mg | ORAL_TABLET | Freq: Once | ORAL | Status: AC
  Administered 2013-11-20: 650 mg via ORAL

## 2013-11-20 MED ORDER — DEXAMETHASONE SODIUM PHOSPHATE 20 MG/5ML IJ SOLN
INTRAMUSCULAR | Status: AC
  Filled 2013-11-20: qty 5

## 2013-11-20 MED ORDER — HEPARIN SOD (PORK) LOCK FLUSH 100 UNIT/ML IV SOLN
500.0000 [IU] | Freq: Once | INTRAVENOUS | Status: DC | PRN
  Filled 2013-11-20: qty 5

## 2013-11-20 MED ORDER — SODIUM CHLORIDE 0.9 % IV SOLN
Freq: Once | INTRAVENOUS | Status: AC
  Administered 2013-11-20: 12:00:00 via INTRAVENOUS

## 2013-11-20 MED ORDER — SODIUM CHLORIDE 0.9 % IJ SOLN
10.0000 mL | INTRAMUSCULAR | Status: DC | PRN
  Filled 2013-11-20: qty 10

## 2013-11-20 MED ORDER — FAMOTIDINE IN NACL 20-0.9 MG/50ML-% IV SOLN
INTRAVENOUS | Status: AC
  Filled 2013-11-20: qty 50

## 2013-11-20 MED ORDER — ONDANSETRON 8 MG/50ML IVPB (CHCC)
8.0000 mg | Freq: Once | INTRAVENOUS | Status: AC
  Administered 2013-11-20: 8 mg via INTRAVENOUS

## 2013-11-20 MED ORDER — DIPHENHYDRAMINE HCL 50 MG/ML IJ SOLN
INTRAMUSCULAR | Status: AC
  Filled 2013-11-20: qty 1

## 2013-11-20 MED ORDER — ACETAMINOPHEN 325 MG PO TABS
ORAL_TABLET | ORAL | Status: AC
  Filled 2013-11-20: qty 2

## 2013-11-20 MED ORDER — DIPHENHYDRAMINE HCL 50 MG/ML IJ SOLN
25.0000 mg | Freq: Once | INTRAMUSCULAR | Status: AC
  Administered 2013-11-20: 25 mg via INTRAVENOUS

## 2013-11-20 NOTE — Patient Instructions (Signed)
Blood Transfusion  A blood transfusion replaces your blood or some of its parts. Blood is replaced when you have lost blood because of surgery, an accident, or for severe blood conditions like anemia. You can donate blood to be used on yourself if you have a planned surgery. If you lose blood during that surgery, your own blood can be given back to you. Any blood given to you is checked to make sure it matches your blood type. Your temperature, blood pressure, and heart rate (vital signs) will be checked often.  GET HELP RIGHT AWAY IF:   You feel sick to your stomach (nauseous) or throw up (vomit).  You have watery poop (diarrhea).  You have shortness of breath or trouble breathing.  You have blood in your pee (urine) or have dark colored pee.  You have chest pain or tightness.  Your eyes or skin turn yellow (jaundice).  You have a temperature by mouth above 102 F (38.9 C), not controlled by medicine.  You start to shake and have chills.  You develop a a red rash (hives) or feel itchy.  You develop lightheadedness or feel confused.  You develop back, joint, or muscle pain.  You do not feel hungry (lost appetite).  You feel tired, restless, or nervous.  You develop belly (abdominal) cramps. Document Released: 02/08/2009 Document Revised: 02/04/2012 Document Reviewed: 02/08/2009 [employer] Patient Information 2014 [employer]  

## 2013-11-20 NOTE — Telephone Encounter (Signed)
Pt is already here and aware of her appt...td

## 2013-11-20 NOTE — Progress Notes (Signed)
OFFICE PROGRESS NOTE   11/20/2013   Physicians: D.Swayme (PCP)  Patient seen for first time by this MD. Most current and previous EMR information reviewed by MD. History also from patient and family now.  INTERVAL HISTORY:  Patient is seen as work in today at request of husband due to thrombocytopenia and chemotherapy concerns, as she has recently begun weekly taxol by Dr Welton Flakes for newly diagnosed metastatic breast cancer to bone, with cytopenias related to marrow involvement from the metastatic cancer. Dr Welton Flakes is away today, but had discussed this case with me previously. Patient is accompanied by husband and sister. Platelets today are 26k. They are in agreement with platelet transfusion today, however are anxious about holding taxol.  She is due third weekly taxol today, planned weekly x3 then one week break; last xgeva on [facility] was 09-23-13. She was transfused platelets 12-11 and 12-19 for platelets 31 and 34k. She has not had overt bleeding, but has bruises LUE. Appetite is poor, no imporvement that patient notices on marinol, tho family is encouraging her to eat every 2 hours. She had more discomfort in LE since outpatient PT 12-23 for vertigo symptoms, PT set up from recent hospitalization for vertigo as PT maneuvers in hospital had been helpful. She is reluctant to use pain medication, but did use total 2 hydrocodone 5-325 yesterday with transient improvement in LE soreness; prior to yesterday, she had used no pain medication in several days, except possibly at hs. They do not plan to continue the PT; vertigo has resolved. Bowels are moving daily. She denies significant nausea or peripheral neuropathy. She has PAC.  ONCOLOGIC HISTORY #1 patient presented with a mass in her right breast in October 2007. She underwent bilateral mammograms. The right breast ultrasound performed on 08/19/2006 showed increased density and distortion in the subareolar location of the right breast.  Ultrasound showed abnormal echoes in the subareolar location which was a change measuring 1.5 cm. Patient underwent an ultrasound-guided biopsy on 08/23/2006 which showed invasive mammary carcinoma with lobular features. ER +100% PR 3% proliferation marker Ki-67 12% HER-2/neu 2+ by fish. MRI of the breasts on 08/30/2006 showed an ill-defined enhancing mass in the right breast measuring 2.6 x 4.0 x 4.1 cm. Enhancement trailed out to the nipple but did not involve the nipple. There was also a small enhancing internal mammary node measuring 7 mm suspicious for metastatic disease. Left breast was negative.  #2 patient was seen by Dr. Beatris Ship on 09/09/2006. She received neoadjuvant chemotherapy initially consisting of FEC regimen given dose dense for 4 cycles followed by Taxotere. She received her chemotherapy she received her chemotherapy from 09/20/2006 through February 2008. She then went on to lumpectomy on 02/07/2007. However there was essentially residual tumor dispersed over an area 1.8 cm with some close surgical margins at the medial area. Her case was reviewed at the breast conference and it was suggested she undergo mastectomy. Patient underwent a mastectomy on 03/19/2007.  #3 she was subsequently seen by radiation oncology for consideration of post mastectomy radiation therapy. However, she was not given radiation.  #4 she was then begun on Femara 2.5 mg daily. She has now completed 5 years of therapy. It was discontinued in June 2014.  #5 Returned to clinic on 08/05/13 for evaluation and management of new anemia. She has underwent PET/CT chest abdomen and pelvis which reveals diffuse lytic lesions throughout the axial and appendicular skeleton. Bone marrow biopsy was consistent with metastatic ER/PR positive breast cancer.  #6  status post Arimidex Unfortunately she had progressive disease. Patient sought second opinion at [facility] [place] in [place]. They recommended single agent Taxol,  which was begun at 80 mg/m2 on 11-06-13, planned weekly x 3 every 28 days.    Review of systems as above, also: She is carefully ambulatory in one level home. Pain in LE is generalized, without increased weakness. She has some back discomfort, not worse than previously. She denies increased SOB or cough. She has had no problems with PAC. She has no other new neurologic symptoms, acute vertigo having resolved in hospital with positioning techniques by PT.  Remainder of 10 point Review of Systems negative.  Objective:  Vital signs in last 24 hours: BP 114/69, HR 100 regular, Resp 16, temp 99 initially and 98.7 on repeat. Last weight 100.5 lb on 11-06-13. Frail appearing lady, looks chronically ill but not in acute distress, alert, oriented and appropriate. In wheelchair for entirety of visit. Family very concerned and supportive.   HEENT:PERRL, nonystagmus, sclerae not icteric. Oral mucosa moist without lesions, posterior pharynx clear.  Neck supple. No JVD.  Lymphatics:no cervical,suraclavicular adenopathy Resp: clear to auscultation bilaterally. Respirations not labored RA.   Cardio: regular rate and rhythm. No gallop.Clear heart sounds GI: soft, nontender, not distended, no appreciable organomegaly on exam possible in WC, some bowel sounds. Musculoskeletal/ Extremities: without pitting edema, cords, or calf tenderness. No lymphedema UE. Neuro: no peripheral neuropathy. Speech fluent, no apparent CN deficits. Moves all extremities in WC.   Skin without rash or petechiae. Resolving ecchymosis left dorsum hand and wrist Portacath-without erythema or tenderness  Lab Results:  Results for orders placed in visit on 11/20/13  CBC WITH DIFFERENTIAL      Result Value Range   WBC 4.7  3.9 - 10.3 10e3/uL   HGB 10.5 (*) 11.6 - 15.9 g/dL   HCT 46.9 (*) 62.9 - 52.8 %   Platelets 26 (*) 145 - 400 10e3/uL   MCV 90.9  79.5 - 101.0 fL   MCH 29.7  25.1 - 34.0 pg   MCHC 32.7  31.5 - 36.0 g/dL    RBC 4.13 (*) 2.44 - 5.45 10e6/uL   RDW 16.6 (*) 11.2 - 14.5 %  MANUAL DIFFERENTIAL      Result Value Range   ANC ([facility] manual diff) 2.3  1.5 - 6.5 10e3/uL   ALC 1.2  0.9 - 3.3 10e3/uL   SEG 27 (*) 38 - 77 %   Band Neutrophils 8  0 - 10 %   LYMPH 25  14 - 49 %   MONO 17 (*) 0 - 14 %   EOS 2  0 - 7 %   Basophil 2  0 - 2 %   Metamyelocytes 5 (*) 0 - 0 %   Myelocytes 10 (*) 0 - 0 %   Blasts 4 (*) 0 - 0 %   nRBC 21 (*) 0 - 0 %   Polychromasia Moderate  Slight   Tear Drop Cells Few  Negative   PLT EST Decreased  Adequate   Counts discussed with [facility] lab, with NRBCs, metas, myelos, and a few blasts noted today as she has had with recent diffs, consistent with marrow involvement.    CMET resulted after visit, with Na 134, AP stable 236, AST 88, alb 3.4 otherwise WNL  Studies/Results:  I have reviewed spine MRIs and last PET  Medications: I have reviewed the patient's current medications. She is intolerant to tramadol "too strong" . We  have discussed using hydrocodone on a more regular basis. I have given written and oral instructions for colace, miralax, senokotS to keep bowels moving daily.They are aware that she should not take asa or NSAID due to low platelets. They did not bring medication bottles to visit, but state that she has sufficient hydrocodone tablets.  DISCUSSION: we have discussed present situation as above. I have explained that we need to try to find correct balance to allow chemotherapy to improve cancer in bones/ marrow without excessive chemo related decrease in platelets. Tho low dose taxol is not markedly myelotoxic, her platelet count continues to be quite low, with Dr Welton Flakes obviously concerned about this also. We have decided to transfuse platelets today then give taxol at 50% dose = 40 mg/m2. I will see her back with CBC next week (no treatment next week) and she will see Dr Welton Flakes as scheduled 12-01-12. They are to call if any significant bleeding or other concerns prior  to visits.   Chemotherapy orders changed and communicated to [facility] pharmacist and to infusion RN. Blood bank working to obtain platelets now and logistics discussed with infusion area.  Assessment/Plan: 1. Right breast carcinoma, post treatment as above, extensively metastatic to bone since 07-2013 when she presented with hgb ~ 8.6 and plt 100-120k, with bone marrow confirming metastatic breast carcinoma. She has progressed on arimidex and is now on weekly taxol. Will transfuse platelets today and reduce taxol dose today to 40 mg/m2. Close follow up with MD and labs as above. 2.Cytopenias related to extensive marrow replacement by metastatic breast cancer, with immature cells in peripheral blood as above. 3.acute vertigo resolved with gentle PT maneuvers during recent hospitalization. 4.Poor nutritional status: weight down ~ 11 lbs in last 6 months. [facility] dietician involved. Not clear if marinol is helping. 5.PAC in 6. Flu vaccine not recorded in EMR and will need to follow up, tho this not noted until after visit today.  Time spent 50 min, including >50% direct patient counseling and coordination of care. Patient and family had all questions addressed and are in agreement with plan. Patient's illness is obviously very stressful for family, and husband expressed appreciation for care.   Gina [name] P, MD   11/20/2013, 11:30 AM

## 2013-11-20 NOTE — Telephone Encounter (Signed)
Pt here forTaxol today, Plt 26. Reviewed with Dr. Darrold Span as MD out of office. Per Dr. Darrold Span, verbal order -No Taxol, platelets only. Blood bank to call back regarding availability of platelets.

## 2013-11-20 NOTE — Telephone Encounter (Signed)
Spoke with Pt and husband in waiting area, MD advised no taxol today, recommending platelet infusion. Pt's husband requested to speak with Dr. Darrold Span to discuss his concerns as he feels pt should receive taxol and platelets. Reviewed with MD for further instructions.

## 2013-11-20 NOTE — Patient Instructions (Signed)
If you notice any constipation after using pain medicine, you can use Colace (docusate sodium 100 mg,stool softener, one tablet once or twice daily) or SenokotS (or pharmacy equivalent stool softener + laxative one or two tablets daily) or Miralax (glycolax, 1/2 to one capful daily)  No aspirin or nonsteroidal antiinflammatory medicines (like advil, aleve, ibuprofen)  Call if any significant bleeding  Only gentle manipulations for vertigo if at all

## 2013-11-22 LAB — PREPARE PLATELET PHERESIS: Unit division: 0

## 2013-11-23 NOTE — Telephone Encounter (Signed)
sw pt spouse gv appt for 11/25/13 w/labs@ 9am and ov@ 9:30am. Pt husband is aware....td

## 2013-11-24 ENCOUNTER — Encounter: Payer: 59 | Admitting: Physical Therapy

## 2013-11-25 ENCOUNTER — Encounter: Payer: Self-pay | Admitting: Oncology

## 2013-11-25 ENCOUNTER — Telehealth: Payer: Self-pay | Admitting: *Deleted

## 2013-11-25 ENCOUNTER — Ambulatory Visit (HOSPITAL_BASED_OUTPATIENT_CLINIC_OR_DEPARTMENT_OTHER): Payer: 59 | Admitting: Oncology

## 2013-11-25 ENCOUNTER — Other Ambulatory Visit (HOSPITAL_BASED_OUTPATIENT_CLINIC_OR_DEPARTMENT_OTHER): Payer: 59

## 2013-11-25 VITALS — BP 113/67 | HR 98 | Temp 97.8°F | Resp 18 | Ht 63.0 in | Wt 101.3 lb

## 2013-11-25 DIAGNOSIS — D696 Thrombocytopenia, unspecified: Secondary | ICD-10-CM

## 2013-11-25 DIAGNOSIS — C50119 Malignant neoplasm of central portion of unspecified female breast: Secondary | ICD-10-CM

## 2013-11-25 DIAGNOSIS — C50911 Malignant neoplasm of unspecified site of right female breast: Secondary | ICD-10-CM

## 2013-11-25 DIAGNOSIS — C7951 Secondary malignant neoplasm of bone: Secondary | ICD-10-CM

## 2013-11-25 DIAGNOSIS — E46 Unspecified protein-calorie malnutrition: Secondary | ICD-10-CM

## 2013-11-25 LAB — CBC WITH DIFFERENTIAL/PLATELET
BASO%: 7 % — ABNORMAL HIGH (ref 0.0–2.0)
Basophils Absolute: 0.3 10*3/uL — ABNORMAL HIGH (ref 0.0–0.1)
EOS%: 2.2 % (ref 0.0–7.0)
HCT: 31.7 % — ABNORMAL LOW (ref 34.8–46.6)
HGB: 10 g/dL — ABNORMAL LOW (ref 11.6–15.9)
LYMPH%: 35.5 % (ref 14.0–49.7)
MCH: 29.4 pg (ref 25.1–34.0)
MCHC: 31.5 g/dL (ref 31.5–36.0)
MCV: 93.2 fL (ref 79.5–101.0)
MONO#: 0.9 10*3/uL (ref 0.1–0.9)
MONO%: 19.4 % — ABNORMAL HIGH (ref 0.0–14.0)
NEUT%: 35.9 % — ABNORMAL LOW (ref 38.4–76.8)
Platelets: 32 10*3/uL — ABNORMAL LOW (ref 145–400)
WBC: 4.6 10*3/uL (ref 3.9–10.3)

## 2013-11-25 NOTE — Telephone Encounter (Signed)
Per staff message and POF I have scheduled appts.  JMW  

## 2013-11-25 NOTE — Patient Instructions (Signed)
Call if any significant bleeding   [phone]  Stop marinol  Continue supplement drinks   You will get the bone treatment Xgeva after you see Dr Welton Flakes on Jan 6

## 2013-11-25 NOTE — Progress Notes (Signed)
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 She has had no bleeding since platelets and day 15 cycle 1 taxol (dose reduced) were given 11-20-13. She took hydrocodone ~ 3x daily around treatment last week, but again now needs this only 1-2x daily. She has had no further vertigo symptoms, not doing more outpatient PT for this now. She has had no nausea and has ordered supplement recommended by [facility] dietician which she likes, drank 2 yesterday + eating small amounts 6x daily. Bowels moved this AM. No peripheral neuropathy. No SOB. Is walking a little at home.   Taxol was reduced to 40 mg/m2 for treatment 12-26 -- see note that day. Last Xgeva per husband was 10-30-13, which is not in this EMR, apparently done at [facility] [place]. Last in this EMR was 09-23-13.  ONCOLOGIC HISTORY #1 patient presented with a mass in her right breast in October 2007. She underwent bilateral mammograms. The right breast ultrasound performed on 08/19/2006 showed increased density and distortion in the subareolar location of the right breast. Ultrasound showed abnormal echoes in the subareolar location which was a change measuring 1.5 cm. Patient underwent an ultrasound-guided biopsy on 08/23/2006 which showed invasive mammary carcinoma with lobular features. ER +100% PR 3% proliferation marker Ki-67 12% HER-2/neu 2+ by fish. MRI of the breasts on 08/30/2006 showed an ill-defined enhancing mass in the right breast measuring 2.6 x 4.0 x 4.1 cm. Enhancement trailed out to the nipple but did not involve the nipple. There was also a small enhancing internal mammary node measuring 7 mm suspicious for  metastatic disease. Left breast was negative.  #2 patient was seen by Dr. Beatris Ship on 09/09/2006. She received neoadjuvant chemotherapy initially consisting of FEC regimen given dose dense for 4 cycles followed by Taxotere. She received her chemotherapy she received her chemotherapy from 09/20/2006 through February 2008. She then went on to lumpectomy on 02/07/2007. However there was essentially residual tumor dispersed over an area 1.8 cm with some close surgical margins at the medial area. Her case was reviewed at the breast conference and it was suggested she undergo mastectomy. Patient underwent a mastectomy on 03/19/2007.  #3 she was subsequently seen by radiation oncology for consideration of post mastectomy radiation therapy. However, she was not given radiation.  #4 she was then begun on Femara 2.5 mg daily. She has now completed 5 years of therapy. It was discontinued in June 2014.  #5 Returned to clinic on 08/05/13 for evaluation and management of new anemia. She has underwent PET/CT chest abdomen and pelvis which reveals diffuse lytic lesions throughout the axial and appendicular skeleton. Bone marrow biopsy was consistent with metastatic ER/PR positive breast cancer.  #6 status post Arimidex Unfortunately she had progressive disease. Patient sought second opinion at [facility] [place] in [place]. They recommended single agent Taxol. Taxol was begun at 80 mg/m2 on 11-06-13, planned weekly x 3 every 28 days. She needed platelet support thru cycle 1 (12-11, 12-19 and 12-26) and taxol dose was decreased to 40 mg/m2 for day 15 cycle 1 due to the thrombocytopenia.  Rivka Barbara was begun 09-23-13 and also given 10-30-13 (at [facility])     Review of systems  as above, also: No aches after low dose taxol 12-26. No problems with platelet transfusion 12-26. Feels legs are more sore when due Xgeva. Able to sleep last pm. Prilosec from Dr Kinnie Scales controlling GERD. Bladder ok. No  swelling LE. No problems with PAC. Remainder of 10 point Review of Systems negative.  Objective:  Vital signs in last 24 hours:  BP 113/67  Pulse 98  Temp(Src) 97.8 F (36.6 C) (Oral)  Resp 18  Ht 5\' 3"  (1.6 m)  Wt 101 lb 4.8 oz (45.949 kg)  BMI 17.95 kg/m2 Weight is up 1 lb from 11-06-13.  Alert, fully oriented and appropriate, looks comfortable seated in WC, respirations not labored RA. More talkative, smiling and laughing some today. Family very supportive, husband does not seem quite as anxious today. Alopecia  HEENT:PERRL, sclerae not icteric. Oral mucosa fairly moist without lesions.  Neck supple. No JVD.  Lymphatics:no cervical,suraclavicular adenopathy Resp: clear to auscultation bilaterally anteriorly and posteriorly Cardio: regular rate and rhythm. No gallop. GI: soft, nontender, not distended, no obvious mass or organomegaly on exam done in WC.  Bowel sounds present.  Musculoskeletal/ Extremities: without pitting edema, cords, tenderness. Little muscle mass in extremities.  Neuro: Speech fluent, CN intact, moves all extremities easily,no peripheral neuropathy. Otherwise nonfocal on exam in WC. Skin without rash, ecchymosis, petechiae Portacath-without erythema or tenderness. Surgical glue still at right clavicle  Lab Results:  Results for orders placed in visit on 11/25/13  TECHNOLOGIST REVIEW      Result Value Range   Technologist Review 8% Meta, 17% Myelo, 1% Blast, 11% NRBC    CBC WITH DIFFERENTIAL      Result Value Range   WBC 4.6  3.9 - 10.3 10e3/uL   NEUT# 1.7  1.5 - 6.5 10e3/uL   HGB 10.0 (*) 11.6 - 15.9 g/dL   HCT 96.0 (*) 45.4 - 09.8 %   Platelets 32 (*) 145 - 400 10e3/uL   MCV 93.2  79.5 - 101.0 fL   MCH 29.4  25.1 - 34.0 pg   MCHC 31.5  31.5 - 36.0 g/dL   RBC 1.19 (*) 1.47 - 8.29 10e6/uL   RDW 16.3 (*) 11.2 - 14.5 %   lymph# 1.6  0.9 - 3.3 10e3/uL   MONO# 0.9  0.1 - 0.9 10e3/uL   Eosinophils Absolute 0.1  0.0 - 0.5 10e3/uL   Basophils Absolute  0.3 (*) 0.0 - 0.1 10e3/uL   NEUT% 35.9 (*) 38.4 - 76.8 %   LYMPH% 35.5  14.0 - 49.7 %   MONO% 19.4 (*) 0.0 - 14.0 %   EOS% 2.2  0.0 - 7.0 %   BASO% 7.0 (*) 0.0 - 2.0 %   nRBC 13 (*) 0 - 0 %    Immature cells noted again, consistent with marrow involvement with metastatic breast cancer. As no bleeding and not for chemo today, no platelet transfusion needed today.   Studies/Results:  No results found.  Medications: I have reviewed the patient's current medications. She is off Arimidex, which will be taken off active EMR meds. She stopped Remeron 4 weeks ago when marinol was begun, and will be taken off EMR meds. She has noticed no improvement in appetite with marinol, which she will stop now. She was confused and sedated with tramadol, which she will no longer use. Husband requests something else for appetite and depression. Since she has new supplements that she likes (500 cal, lots of protein each) and really looks better overall today, I have recommended  that we not add additional medication to her present coverage just now. They are avoiding ASA, NSAIDs. She tolerated xgeva without problems previously. She did have flu vaccine at [facility] ~ Aug 2015 "as soon as it came out". They are aware that vaccine is not covering all of flu and that Tamiflu is appropriate if flu symptoms begin.  DISCUSSION: Husband has numerous questions about chemo, platelet transfusions, # of treatments until situation might improve, meds as above, nutrition concerns. I have answered all of these as best possible, telling him that the priority now is getting control of the metastatic breast cancer, which may require additional platelet transfusions to support chemotherapy. I told them that it may take even 3 full cycles of the weekly taxol to tell if this is helping, and that objective just now is to manage side effects and symptoms as best possible while we give the chemo a chance to help. I have told them that I am  delighted that she is tolerating the supplements and other po's better now, and that she is more comfortable today. Patient and family seemed to follow discussion well and are in agreement with plan to see Dr Welton Flakes Jan 6 with Rivka Barbara that day, then day 1 cycle 2 weekly taxol on Jan 9, dose of taxol to be decided by Dr Welton Flakes.  Assessment/Plan:  1. Right breast carcinoma, post treatment as above, extensively metastatic to bone since 07-2013 when she presented with hgb ~ 8.6 and plt 100-120k, with bone marrow confirming metastatic breast carcinoma. Cycle 1 weekly taxol completed last week. She will see Dr Welton Flakes with xgeva 12-01-13 and begin cycle 2 weekly taxol 12-04-13. Platelet transfusions as needed. She has not had gCSF. Q 4 week xgeva orders in EMR. 2.Cytopenias related to extensive marrow replacement by metastatic breast cancer, with immature cells in peripheral blood as above. Follow.  3.acute vertigo resolved with gentle PT maneuvers during recent hospitalization. No metastatic disease to brain, tho extensive bony involvement of skull and some adjacent dural enhancement on MRI brain 11-03-13. 4.Poor nutritional status: weight down ~ 11 lbs in last 6 months, but up 1 lb now. [facility] dietician assisting. Likes Enu supplement. Marinol not helping, stopped now. 5.PAC in  6. Flu vaccine done this fall   They know that they can call at any time if needed prior to next scheduled appointment. Time spent 25 min including >50% counseling and coordination of care.    Beauty Pless P, MD   11/25/2013, 10:08 AM

## 2013-11-27 ENCOUNTER — Encounter: Payer: 59 | Admitting: Physical Therapy

## 2013-12-01 ENCOUNTER — Other Ambulatory Visit (HOSPITAL_BASED_OUTPATIENT_CLINIC_OR_DEPARTMENT_OTHER): Payer: 59

## 2013-12-01 ENCOUNTER — Ambulatory Visit: Payer: 59

## 2013-12-01 ENCOUNTER — Telehealth: Payer: Self-pay | Admitting: Oncology

## 2013-12-01 ENCOUNTER — Other Ambulatory Visit: Payer: Self-pay | Admitting: Emergency Medicine

## 2013-12-01 ENCOUNTER — Ambulatory Visit (HOSPITAL_BASED_OUTPATIENT_CLINIC_OR_DEPARTMENT_OTHER): Payer: 59

## 2013-12-01 ENCOUNTER — Telehealth: Payer: Self-pay | Admitting: *Deleted

## 2013-12-01 ENCOUNTER — Ambulatory Visit (HOSPITAL_BASED_OUTPATIENT_CLINIC_OR_DEPARTMENT_OTHER): Payer: 59 | Admitting: Oncology

## 2013-12-01 VITALS — BP 116/75 | HR 114 | Temp 98.9°F | Resp 18 | Ht 63.0 in | Wt 98.9 lb

## 2013-12-01 DIAGNOSIS — D696 Thrombocytopenia, unspecified: Secondary | ICD-10-CM

## 2013-12-01 DIAGNOSIS — Z95828 Presence of other vascular implants and grafts: Secondary | ICD-10-CM

## 2013-12-01 DIAGNOSIS — C773 Secondary and unspecified malignant neoplasm of axilla and upper limb lymph nodes: Secondary | ICD-10-CM

## 2013-12-01 DIAGNOSIS — C50119 Malignant neoplasm of central portion of unspecified female breast: Secondary | ICD-10-CM

## 2013-12-01 DIAGNOSIS — Z452 Encounter for adjustment and management of vascular access device: Secondary | ICD-10-CM

## 2013-12-01 DIAGNOSIS — C7952 Secondary malignant neoplasm of bone marrow: Secondary | ICD-10-CM

## 2013-12-01 DIAGNOSIS — C50919 Malignant neoplasm of unspecified site of unspecified female breast: Secondary | ICD-10-CM

## 2013-12-01 DIAGNOSIS — Z170 Estrogen receptor positive status [ER+]: Secondary | ICD-10-CM

## 2013-12-01 DIAGNOSIS — C7951 Secondary malignant neoplasm of bone: Secondary | ICD-10-CM

## 2013-12-01 DIAGNOSIS — D649 Anemia, unspecified: Secondary | ICD-10-CM

## 2013-12-01 LAB — CBC WITH DIFFERENTIAL/PLATELET
BASO%: 4.2 % — ABNORMAL HIGH (ref 0.0–2.0)
BASOS ABS: 0.3 10*3/uL — AB (ref 0.0–0.1)
EOS%: 1.5 % (ref 0.0–7.0)
Eosinophils Absolute: 0.1 10*3/uL (ref 0.0–0.5)
HEMATOCRIT: 28.2 % — AB (ref 34.8–46.6)
HGB: 8.9 g/dL — ABNORMAL LOW (ref 11.6–15.9)
LYMPH#: 1.5 10*3/uL (ref 0.9–3.3)
LYMPH%: 22.6 % (ref 14.0–49.7)
MCH: 29 pg (ref 25.1–34.0)
MCHC: 31.6 g/dL (ref 31.5–36.0)
MCV: 91.9 fL (ref 79.5–101.0)
MONO#: 1.4 10*3/uL — AB (ref 0.1–0.9)
MONO%: 21.3 % — ABNORMAL HIGH (ref 0.0–14.0)
NEUT#: 3.3 10*3/uL (ref 1.5–6.5)
NEUT%: 50.4 % (ref 38.4–76.8)
Platelets: 38 10*3/uL — ABNORMAL LOW (ref 145–400)
RBC: 3.07 10*6/uL — ABNORMAL LOW (ref 3.70–5.45)
RDW: 16 % — AB (ref 11.2–14.5)
WBC: 6.5 10*3/uL (ref 3.9–10.3)
nRBC: 10 % — ABNORMAL HIGH (ref 0–0)

## 2013-12-01 LAB — COMPREHENSIVE METABOLIC PANEL (CC13)
ALT: 18 U/L (ref 0–55)
AST: 24 U/L (ref 5–34)
Albumin: 2.5 g/dL — ABNORMAL LOW (ref 3.5–5.0)
Alkaline Phosphatase: 199 U/L — ABNORMAL HIGH (ref 40–150)
Anion Gap: 11 mEq/L (ref 3–11)
BUN: 19.3 mg/dL (ref 7.0–26.0)
CALCIUM: 9.8 mg/dL (ref 8.4–10.4)
CHLORIDE: 98 meq/L (ref 98–109)
CO2: 26 mEq/L (ref 22–29)
Creatinine: 0.6 mg/dL (ref 0.6–1.1)
GLUCOSE: 182 mg/dL — AB (ref 70–140)
Potassium: 4.1 mEq/L (ref 3.5–5.1)
SODIUM: 135 meq/L — AB (ref 136–145)
Total Bilirubin: 0.4 mg/dL (ref 0.20–1.20)
Total Protein: 7.3 g/dL (ref 6.4–8.3)

## 2013-12-01 LAB — TECHNOLOGIST REVIEW

## 2013-12-01 MED ORDER — SODIUM CHLORIDE 0.9 % IJ SOLN
10.0000 mL | INTRAMUSCULAR | Status: DC | PRN
  Administered 2013-12-01: 10 mL via INTRAVENOUS
  Filled 2013-12-01: qty 10

## 2013-12-01 MED ORDER — HEPARIN SOD (PORK) LOCK FLUSH 100 UNIT/ML IV SOLN
500.0000 [IU] | Freq: Once | INTRAVENOUS | Status: AC
  Administered 2013-12-01: 500 [IU] via INTRAVENOUS
  Filled 2013-12-01: qty 5

## 2013-12-01 MED ORDER — VENLAFAXINE HCL ER 37.5 MG PO CP24: 37.5000 mg | ORAL_CAPSULE | Freq: Every day | ORAL | Status: AC

## 2013-12-01 MED ORDER — DRONABINOL 2.5 MG PO CAPS: 2.5000 mg | ORAL_CAPSULE | Freq: Two times a day (BID) | ORAL | Status: DC

## 2013-12-01 NOTE — Telephone Encounter (Signed)
Per staff message and POF I have scheduled appts.  JMW  

## 2013-12-01 NOTE — Patient Instructions (Signed)
Implanted Port Instructions  An implanted port is a central line that has a round shape and is placed under the skin. It is used for long-term IV (intravenous) access for:  · Medicine.  · Fluids.  · Liquid nutrition, such as TPN (total parenteral nutrition).  · Blood samples.  Ports can be placed:  · In the chest area just below the collarbone (this is the most common place.)  · In the arms.  · In the belly (abdomen) area.  · In the legs.  PARTS OF THE PORT  A port has 2 main parts:  · The reservoir. The reservoir is round, disc-shaped, and will be a small, raised area under your skin.  · The reservoir is the part where a needle is inserted (accessed) to either give medicines or to draw blood.  · The catheter. The catheter is a long, slender tube that extends from the reservoir. The catheter is placed into a large vein.  · Medicine that is inserted into the reservoir goes into the catheter and then into the vein.  INSERTION OF THE PORT  · The port is surgically placed in either an operating room or in a procedural area (interventional radiology).  · Medicine may be given to help you relax during the procedure.  · The skin where the port will be inserted is numbed (local anesthetic).  · 1 or 2 small cuts (incisions) will be made in the skin to insert the port.  · The port can be used after it has been inserted.  INCISION SITE CARE  · The incision site may have small adhesive strips on it. This helps keep the incision site closed. Sometimes, no adhesive strips are placed. Instead of adhesive strips, a special kind of surgical glue is used to keep the incision closed.  · If adhesive strips were placed on the incision sites, do not take them off. They will fall off on their own.  · The incision site may be sore for 1 to 2 days. Pain medicine can help.  · Do not get the incision site wet. Bathe or shower as directed by your caregiver.  · The incision site should heal in 5 to 7 days. A small scar may form after the  incision has healed.  ACCESSING THE PORT  Special steps must be taken to access the port:  · Before the port is accessed, a numbing cream can be placed on the skin. This helps numb the skin over the port site.  · A sterile technique is used to access the port.  · The port is accessed with a needle. Only "non-coring" port needles should be used to access the port. Once the port is accessed, a blood return should be checked. This helps ensure the port is in the vein and is not clogged (clotted).  · If your caregiver believes your port should remain accessed, a clear (transparent) bandage will be placed over the needle site. The bandage and needle will need to be changed every week or as directed by your caregiver.  · Keep the bandage covering the needle clean and dry. Do not get it wet. Follow your caregiver's instructions on how to take a shower or bath when the port is accessed.  · If your port does not need to stay accessed, no bandage is needed over the port.  FLUSHING THE PORT  Flushing the port keeps it from getting clogged. How often the port is flushed depends on:  · If a   constant infusion is running. If a constant infusion is running, the port may not need to be flushed.  · If intermittent medicines are given.  · If the port is not being used.  For intermittent medicines:  · The port will need to be flushed:  · After medicines have been given.  · After blood has been drawn.  · As part of routine maintenance.  · A port is normally flushed with:  · Normal saline.  · Heparin.  · Follow your caregiver's advice on how often, how much, and the type of flush to use on your port.  IMPORTANT PORT INFORMATION  · Tell your caregiver if you are allergic to heparin.  · After your port is placed, you will get a manufacturer's information card. The card has information about your port. Keep this card with you at all times.  · There are many types of ports available. Know what kind of port you have.  · In case of an  emergency, it may be helpful to wear a medical alert bracelet. This can help alert health care workers that you have a port.  · The port can stay in for as long as your caregiver believes it is necessary.  · When it is time for the port to come out, surgery will be done to remove it. The surgery will be similar to how the port was put in.  · If you are in the hospital or clinic:  · Your port will be taken care of and flushed by a nurse.  · If you are at home:  · A home health care nurse may give medicines and take care of the port.  · You or a family member can get special training and directions for giving medicine and taking care of the port at home.  SEEK IMMEDIATE MEDICAL CARE IF:   · Your port does not flush or you are unable to get a blood return.  · New drainage or pus is coming from the incision.  · A bad smell is coming from the incision site.  · You develop swelling or increased redness at the incision site.  · You develop increased swelling or pain at the port site.  · You develop swelling or pain in the surrounding skin near the port.  · You have an oral temperature above 102° F (38.9° C), not controlled by medicine.  MAKE SURE YOU:   · Understand these instructions.  · Will watch your condition.  · Will get help right away if you are not doing well or get worse.  Document Released: 11/12/2005 Document Revised: 02/04/2012 Document Reviewed: 02/03/2009  [employer]® Patient Information ©2014 [employer].

## 2013-12-02 ENCOUNTER — Encounter: Payer: 59 | Admitting: Physical Therapy

## 2013-12-03 ENCOUNTER — Encounter: Payer: Self-pay | Admitting: Oncology

## 2013-12-03 NOTE — Progress Notes (Signed)
OFFICE PROGRESS NOTE  CC  [name], MD [address] [place] [address] Dr. 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 Diagnosed in October 2007  PRIOR THERAPY:  #1 patient presented with a mass in her right breast in October 2007. She underwent bilateral mammograms. The right breast ultrasound performed on 08/19/2006 showed increased density and distortion in the subareolar location of the right breast. Ultrasound showed abnormal echoes in the subareolar location which was a change measuring 1.5 cm. Patient underwent an ultrasound-guided biopsy on 08/23/2006 which showed invasive mammary carcinoma with lobular features. ER +100% PR 3% proliferation marker Ki-67 12% HER-2/neu 2+ by fish. MRI of the breasts on 08/30/2006 showed an ill-defined enhancing mass in the right breast measuring 2.6 x 4.0 x 4.1 cm. Enhancement trailed out to the nipple but did not involve the nipple. There was also a small enhancing internal mammary node measuring 7 mm suspicious for metastatic disease. Left breast was negative.  #2 patient was seen by Dr. Rich Reining on 09/09/2006. She received neoadjuvant chemotherapy initially consisting of FEC regimen given dose dense for 4 cycles followed by Taxotere. She received her chemotherapy she received her chemotherapy from 09/20/2006 through February 2008. She then went on to lumpectomy on 02/07/2007. However there was essentially residual tumor dispersed over an area 1.8 cm with some close surgical margins at the medial area. Her case was reviewed at the breast conference and it was suggested she undergo mastectomy. Patient underwent a mastectomy on 03/19/2007.  #3 she was subsequently seen by radiation oncology for consideration of post mastectomy radiation therapy. However, she was not given radiation.  #4 she was then begun on Femara 2.5  mg daily. She has now completed 5 years of therapy. It was discontinued in June 2014.    #5 Returned to clinic on 08/05/13 for evaluation and management of new anemia.  She has underwent PET/CT chest abdomen and pelvis which reveals diffuse lytic lesions throughout the axial and appendicular skeleton.  Bone marrow biopsy was consistent with metastatic ER/PR positive breast cancer.    #6 status post Arimidex  Unfortunately she had progressive disease. Patient sought second opinion at [facility] [place] in [place]. They have recommended single agent Taxol.  CURRENT THERAPY: Taxol weekly/Xgeva  INTERVAL HISTORY: REGENE MCCARTHY 68 y.o. female returns for follow up prior to chemotherapy. She has been receiving weekly Taxol as well as xgeva.  thus far she is tolerating well. 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 This is an ongoing concern. But she does not having any bleeding. On her last visit she did have dose attenuation.  today she is accompanied by her family members. She is denying having any nausea vomiting fevers or chills. Her appetite does remain poor. They are asking for Marinol prescription and I have given this to her. She has no aches or pains her pains are well controlled with her present pain medications. Remainder of the review of systems is negative.   MEDICAL HISTORY: Past Medical History  Diagnosis Date  . Breast cancer 04/28/2013  . Myalgia and myositis, unspecified   . Anemia     ALLERGIES:  is allergic to sudafed.  MEDICATIONS:  Current Outpatient Prescriptions  Medication Sig Dispense Refill  . cetirizine (ZYRTEC) 10 MG tablet Take 10 mg by mouth daily.        . Cholecalciferol (VITAMIN D3) 5000 UNITS TABS Take 5,000 mg  by mouth daily.      Marland Kitchen HYDROcodone-acetaminophen (NORCO/VICODIN) 5-325 MG per tablet Take 1 tablet by mouth every 6 (six) hours as needed for moderate pain.      Marland Kitchen lidocaine-prilocaine (EMLA) cream Apply 1 application topically as needed.  30 g   1  . [place] Mushroom POWD 5 mLs by Does not apply route daily.      . meclizine (ANTIVERT) 25 MG tablet Take 1 tablet (25 mg total) by mouth 3 (three) times daily as needed for dizziness.  30 tablet  0  . omeprazole (PRILOSEC) 40 MG capsule Take 40 mg by mouth daily.      . ondansetron (ZOFRAN) 4 MG tablet Take 1 tablet (4 mg total) by mouth every 6 (six) hours as needed for nausea.  20 tablet  0  . dronabinol (MARINOL) 2.5 MG capsule Take 1 capsule (2.5 mg total) by mouth 2 (two) times daily before a meal.  60 capsule  1  . traMADol (ULTRAM) 50 MG tablet Take 0.5-1 tablets (25-50 mg total) by mouth every 6 (six) hours as needed for pain.  120 tablet  0  . venlafaxine XR (EFFEXOR-XR) 37.5 MG 24 hr capsule Take 1 capsule (37.5 mg total) by mouth daily with breakfast.  30 capsule  9   No current facility-administered medications for this visit.    SURGICAL HISTORY:  Past Surgical History  Procedure Laterality Date  . Mastectomy Right   . Tubal ligation  1971    REVIEW OF SYSTEMS:  A 10 point review of systems was conducted and is otherwise negative except for what is noted above.    PHYSICAL EXAMINATION: Blood pressure 116/75, pulse 114, temperature 98.9 F (37.2 C), temperature source Oral, resp. rate 18, height 5' 3"  (1.6 m), weight 98 lb 14.4 oz (44.861 kg). Body mass index is 17.52 kg/(m^2). General: Patient is a well appearing female, in wheelchair, improved from last exam, weight improved by 4 lbs HEENT: PERRLA, sclerae anicteric, conjunctival pallor, MMM Neck: supple, no palpable adenopathy Lungs: clear to auscultation bilaterally, no wheezes, rhonchi, or rales Cardiovascular: regular rate rhythm, S1, S2, no murmurs, rubs or gallops Abdomen: Soft, non-tender, non-distended, normoactive bowel sounds, no HSM Extremities: warm and well perfused, no clubbing, cyanosis, or edema Skin:  No rashes or lesions Neuro: Non-focal Breasts: deferred ECOG PERFORMANCE STATUS: 0 -  Asymptomatic  LABORATORY DATA: Lab Results  Component Value Date   WBC 6.5 12/01/2013   HGB 8.9* 12/01/2013   HCT 28.2* 12/01/2013   MCV 91.9 12/01/2013   PLT 38* 12/01/2013      Chemistry      Component Value Date/Time   NA 135* 12/01/2013 1324   NA 140 11/04/2013 0454   K 4.1 12/01/2013 1324   K 3.8 11/04/2013 0454   CL 108 11/04/2013 0454   CL 105 04/17/2013 1323   CO2 26 12/01/2013 1324   CO2 23 11/04/2013 0454   BUN 19.3 12/01/2013 1324   BUN 8 11/04/2013 0454   CREATININE 0.6 12/01/2013 1324   CREATININE 0.43* 11/04/2013 0454      Component Value Date/Time   CALCIUM 9.8 12/01/2013 1324   CALCIUM 7.3* 11/04/2013 0454   ALKPHOS 199* 12/01/2013 1324   ALKPHOS 224* 11/03/2013 0548   AST 24 12/01/2013 1324   AST 31 11/03/2013 0548   ALT 18 12/01/2013 1324   ALT 13 11/03/2013 0548   BILITOT 0.40 12/01/2013 1324   BILITOT 0.4 11/03/2013 0548    REPORT OF SURGICAL PATHOLOGY  Case #: [id number] Patient Name: LOMETA, RIGGIN. Office Chart Number: N/A  MRN: [id number] Pathologist: Violet Baldy, MD DOB/Age Apr 19, 1946 (Age: 29) Gender: F Date Taken: 02/07/2007 Date Received: 02/07/2007  FINAL DIAGNOSIS  MICROSCOPIC EXAMINATION AND DIAGNOSIS  1. RIGHT AXILLARY SENTINEL LYMPH NODE, EXCISION: - METASTATIC CARCINOMA CONSISTENT WITH BREAST PRIMARY, SEE COMMENT.  2. RIGHT BREAST, NEEDLE LOCALIZATION BIOPSY: - RESIDUAL CARCINOMA IN TUMOR REGRESSION AREA. - CARCINOMA IS CLOSE TO MEDIAL AND LATERAL SURGICAL MARGINS OF RESECTION. - SEE ONCOLOGY TABLE  COMMENT 1. The sections show metastatic carcinoma on H The tumor is seen in the form of dispersed atypical epithelioid cells in a fibrotic background involving an area more than 0.2 cm consistent with metastatic carcinoma. No extranodal extension is identified. For confirmation, Cytokeratin stain (AE1/AE3) was performed and is positive. The control stained appropriately.  ONCOLOGY TABLE-BREAST, INCISIONAL/EXCISIONAL BIOPSY OR MASTECTOMY WITH LYMPH  NODES  1. Maximum tumor size (cm): Dispersed tumor cells in a tumor regression area measuring 1.8 cm, see comment 2. Margins: Tumor cells are seen less than 0.1 cm from the surgical margins of resection Invasive component, distance to closest margin: Less than 0.1 cm In situ component, distance to closest margin: N/A 3. Vascular/Lymphatic invasion: Yes 4. Histology, invasive component: Invasive mammary carcinoma consistent with lobular carcinoma 5. Grade, invasive component (Elston-Ellis modified Scarff-Bloom-Richardson): II Tubule formation grade: 3 Nuclear pleomorphism grade: 2 Mitotic grade: 1 6. Extensive intraductal component (JCRT): No 7. Histology, in situ component: N/A 8. Grade, in situ component: N/A 9. Multicentric (separate tumors in different quadrants): Unknown 10. Multifocal (separate tumors in same quadrant or biopsy): No 11. Axillary lymph nodes: #examined: 1 #with metastasis: 1 ITC (isolated tumor cells, < 0.9m): 0 Micrometastasis (> 0.266m < 48m75m 0 Metastasis > 2 mm: 1 Extracapsular extension: No 12. TNM Code: ypT1, pN1a, pMX 13. Breast Prognostic Markers: Case number [id number] receptor positive (100%) Progesterone receptor positive (3%) Ki 67 (Mib-1) 12% Her 2 neu (HercepTest) 2+ Her 2 neu by FISH N/A 14. Non-neoplastic breast: Fibrosis 15. Comments: Sections of the localized tissue show a fibrotic area measuring 1.8 cm and displaying dispersed atypical epithelioid cells throughout that area. Some of the cells surround the benign appearing ductal elements. The findings are subtle and most compatible with residual carcinoma. For confirmation cytokeratin stains were performed on several blocks representing the localized tissue and show strong positivity in previously described epithelioid cells confirming the morphologic impression of residual carcinoma. The carcinoma is less than 0.1 cm from the medial surgical margin of resection and  approximately 0.1 cm from the lateral surgical margin of resection. Since the findings are subtle and the tumor is essentially composed of dispersed cells throughout, the status of the medial and lateral margins in particular may be questionable and hence truly negative margins cannot be assured. I strongly recommend clinical correlation and followup. (BNS:caf 02/12/07)  RADIOGRAPHIC STUDIES:  No results found.  ASSESSMENT: 67 2ar old female with  #1 history of stage II invasive mammary carcinoma with lobular features originally diagnosed in 2007. She underwent neoadjuvant chemotherapy consisting of 4 cycles of dose dense FEC followed by dose dense Taxotere for 4 cycles. She then had a lumpectomy that revealed residual disease. She proceeded on to have a mastectomy. She was not a candidate for radiation therapy even though she did have an evaluation by radiation oncology.  #2 Dr. RubTruddie Cocoaced her on Femara 2.5 mg daily she overall tolerated it well. She completed 5 years of Femara. She discontinued this on 04/28/2013.  #  3 Anemia work up started on 08/05/13.  After CT chest abd pelvis, PET scan, and bone marrow biopsy, patient was found to have metastatic breast cancer to the bone.  She will receive Xgeva every four weeks and take Arimidex daily.    #4 patient will begin Taxol 80 mg per meter squared q. 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 Risks benefits and side effects of treatment were discussed with the patient.  #2 patient will proceed with weekly Taxol on Friday as well as  xgeva.  #3 protein calorie malnutrition: encourage increasing her by mouth intake  #4 patient will return in one week's time for followup and next dose of Taxol   All questions were answered. The patient knows to call the clinic with any  problems, questions or concerns. We can certainly see the patient much sooner if necessary.  I spent 25 minutes counseling the patient face to face. The total time spent in the appointment was 30 minutes.    Marcy Panning, MD Medical/Oncology [facility] [phone] (beeper) [phone] (Office)

## 2013-12-04 ENCOUNTER — Ambulatory Visit: Payer: 59

## 2013-12-04 ENCOUNTER — Ambulatory Visit (HOSPITAL_BASED_OUTPATIENT_CLINIC_OR_DEPARTMENT_OTHER): Payer: 59

## 2013-12-04 ENCOUNTER — Other Ambulatory Visit: Payer: 59

## 2013-12-04 VITALS — BP 120/72 | HR 110 | Temp 97.4°F | Resp 20

## 2013-12-04 DIAGNOSIS — C7952 Secondary malignant neoplasm of bone marrow: Secondary | ICD-10-CM

## 2013-12-04 DIAGNOSIS — C50119 Malignant neoplasm of central portion of unspecified female breast: Secondary | ICD-10-CM

## 2013-12-04 DIAGNOSIS — C50919 Malignant neoplasm of unspecified site of unspecified female breast: Secondary | ICD-10-CM

## 2013-12-04 DIAGNOSIS — Z5111 Encounter for antineoplastic chemotherapy: Secondary | ICD-10-CM

## 2013-12-04 DIAGNOSIS — C7951 Secondary malignant neoplasm of bone: Secondary | ICD-10-CM

## 2013-12-04 MED ORDER — FAMOTIDINE IN NACL 20-0.9 MG/50ML-% IV SOLN
INTRAVENOUS | Status: AC
  Filled 2013-12-04: qty 50

## 2013-12-04 MED ORDER — SODIUM CHLORIDE 0.9 % IV SOLN
Freq: Once | INTRAVENOUS | Status: AC
  Administered 2013-12-04: 10:00:00 via INTRAVENOUS

## 2013-12-04 MED ORDER — SODIUM CHLORIDE 0.9 % IV SOLN
80.0000 mg/m2 | Freq: Once | INTRAVENOUS | Status: AC
  Administered 2013-12-04 (×2): 114 mg via INTRAVENOUS
  Filled 2013-12-04: qty 19

## 2013-12-04 MED ORDER — DIPHENHYDRAMINE HCL 50 MG/ML IJ SOLN
INTRAMUSCULAR | Status: AC
  Filled 2013-12-04: qty 1

## 2013-12-04 MED ORDER — DIPHENHYDRAMINE HCL 50 MG/ML IJ SOLN
25.0000 mg | Freq: Once | INTRAMUSCULAR | Status: AC
  Administered 2013-12-04: 25 mg via INTRAVENOUS

## 2013-12-04 MED ORDER — SODIUM CHLORIDE 0.9 % IJ SOLN
10.0000 mL | INTRAMUSCULAR | Status: DC | PRN
  Administered 2013-12-04: 10 mL
  Filled 2013-12-04: qty 10

## 2013-12-04 MED ORDER — DEXAMETHASONE SODIUM PHOSPHATE 20 MG/5ML IJ SOLN
20.0000 mg | Freq: Once | INTRAMUSCULAR | Status: AC
Start: 2013-12-04 — End: 2013-12-04
  Administered 2013-12-04: 20 mg via INTRAVENOUS

## 2013-12-04 MED ORDER — DENOSUMAB 120 MG/1.7ML ~~LOC~~ SOLN
120.0000 mg | Freq: Once | SUBCUTANEOUS | Status: AC
  Administered 2013-12-04: 120 mg via SUBCUTANEOUS
  Filled 2013-12-04: qty 1.7

## 2013-12-04 MED ORDER — HEPARIN SOD (PORK) LOCK FLUSH 100 UNIT/ML IV SOLN
500.0000 [IU] | Freq: Once | INTRAVENOUS | Status: AC | PRN
  Administered 2013-12-04: 500 [IU]
  Filled 2013-12-04: qty 5

## 2013-12-04 MED ORDER — DEXAMETHASONE SODIUM PHOSPHATE 20 MG/5ML IJ SOLN
INTRAMUSCULAR | Status: AC
  Filled 2013-12-04: qty 5

## 2013-12-04 MED ORDER — ONDANSETRON 8 MG/NS 50 ML IVPB
INTRAVENOUS | Status: AC
  Filled 2013-12-04: qty 8

## 2013-12-04 MED ORDER — FAMOTIDINE IN NACL 20-0.9 MG/50ML-% IV SOLN
20.0000 mg | Freq: Once | INTRAVENOUS | Status: AC
  Administered 2013-12-04: 20 mg via INTRAVENOUS

## 2013-12-04 MED ORDER — ONDANSETRON 8 MG/50ML IVPB (CHCC)
8.0000 mg | Freq: Once | INTRAVENOUS | Status: AC
  Administered 2013-12-04: 8 mg via INTRAVENOUS

## 2013-12-04 NOTE — Progress Notes (Signed)
@   10:00 spoke with Dr. Humphrey Rolls, who reports OK to treat patient today without rechecking counts again-OK to treat with current platelet count from 12/01/13. Make her aware if patient is having any bleeding.

## 2013-12-04 NOTE — Patient Instructions (Signed)
[facility] Discharge Instructions for Patients Receiving Chemotherapy  Today you received the following chemotherapy agents Taxol/Xgeva.  To help prevent nausea and vomiting after your treatment, we encourage you to take your nausea medication as prescribed.  Zofran 4mg  every 6 hours as needed, 2.5mg  Marinol twice a day.    If you develop nausea and vomiting that is not controlled by your nausea medication, call the clinic.   BELOW ARE SYMPTOMS THAT SHOULD BE REPORTED IMMEDIATELY:  *FEVER GREATER THAN 100.5 F  *CHILLS WITH OR WITHOUT FEVER  NAUSEA AND VOMITING THAT IS NOT CONTROLLED WITH YOUR NAUSEA MEDICATION  *UNUSUAL SHORTNESS OF BREATH  *UNUSUAL BRUISING OR BLEEDING  TENDERNESS IN MOUTH AND THROAT WITH OR WITHOUT PRESENCE OF ULCERS  *URINARY PROBLEMS  *BOWEL PROBLEMS  UNUSUAL RASH Items with * indicate a potential emergency and should be followed up as soon as possible.  Feel free to call the clinic you have any questions or concerns. The clinic phone number is [phone].

## 2013-12-11 ENCOUNTER — Ambulatory Visit (HOSPITAL_BASED_OUTPATIENT_CLINIC_OR_DEPARTMENT_OTHER): Payer: 59

## 2013-12-11 ENCOUNTER — Encounter: Payer: Self-pay | Admitting: Oncology

## 2013-12-11 ENCOUNTER — Other Ambulatory Visit (HOSPITAL_BASED_OUTPATIENT_CLINIC_OR_DEPARTMENT_OTHER): Payer: 59

## 2013-12-11 ENCOUNTER — Ambulatory Visit (HOSPITAL_BASED_OUTPATIENT_CLINIC_OR_DEPARTMENT_OTHER): Payer: 59 | Admitting: Oncology

## 2013-12-11 ENCOUNTER — Other Ambulatory Visit: Payer: Self-pay | Admitting: Emergency Medicine

## 2013-12-11 VITALS — BP 136/82 | HR 130 | Temp 97.9°F | Resp 18 | Ht 63.0 in | Wt 97.5 lb

## 2013-12-11 DIAGNOSIS — C50119 Malignant neoplasm of central portion of unspecified female breast: Secondary | ICD-10-CM

## 2013-12-11 DIAGNOSIS — C50919 Malignant neoplasm of unspecified site of unspecified female breast: Secondary | ICD-10-CM

## 2013-12-11 DIAGNOSIS — D696 Thrombocytopenia, unspecified: Secondary | ICD-10-CM

## 2013-12-11 DIAGNOSIS — C7951 Secondary malignant neoplasm of bone: Secondary | ICD-10-CM

## 2013-12-11 DIAGNOSIS — E46 Unspecified protein-calorie malnutrition: Secondary | ICD-10-CM

## 2013-12-11 DIAGNOSIS — C773 Secondary and unspecified malignant neoplasm of axilla and upper limb lymph nodes: Secondary | ICD-10-CM

## 2013-12-11 DIAGNOSIS — C7952 Secondary malignant neoplasm of bone marrow: Secondary | ICD-10-CM

## 2013-12-11 DIAGNOSIS — Z5111 Encounter for antineoplastic chemotherapy: Secondary | ICD-10-CM

## 2013-12-11 LAB — COMPREHENSIVE METABOLIC PANEL (CC13)
ALT: 19 U/L (ref 0–55)
ANION GAP: 11 meq/L (ref 3–11)
AST: 25 U/L (ref 5–34)
Albumin: 3.2 g/dL — ABNORMAL LOW (ref 3.5–5.0)
Alkaline Phosphatase: 264 U/L — ABNORMAL HIGH (ref 40–150)
BILIRUBIN TOTAL: 0.42 mg/dL (ref 0.20–1.20)
BUN: 16.3 mg/dL (ref 7.0–26.0)
CO2: 25 mEq/L (ref 22–29)
CREATININE: 0.6 mg/dL (ref 0.6–1.1)
Calcium: 8.2 mg/dL — ABNORMAL LOW (ref 8.4–10.4)
Chloride: 102 mEq/L (ref 98–109)
Glucose: 128 mg/dl (ref 70–140)
Potassium: 4.4 mEq/L (ref 3.5–5.1)
Sodium: 139 mEq/L (ref 136–145)
Total Protein: 7.3 g/dL (ref 6.4–8.3)

## 2013-12-11 LAB — MANUAL DIFFERENTIAL
ALC: 2.5 10*3/uL (ref 0.9–3.3)
ANC (CHCC manual diff): 4 10*3/uL (ref 1.5–6.5)
BAND NEUTROPHILS: 9 % (ref 0–10)
Basophil: 5 % — ABNORMAL HIGH (ref 0–2)
Blasts: 1 % — ABNORMAL HIGH (ref 0–0)
EOS: 3 % (ref 0–7)
LYMPH: 31 % (ref 14–49)
MONO: 10 % (ref 0–14)
Metamyelocytes: 9 % — ABNORMAL HIGH (ref 0–0)
Myelocytes: 19 % — ABNORMAL HIGH (ref 0–0)
OTHER CELL: 0 % (ref 0–0)
PLT EST: DECREASED
PROMYELO: 0 % (ref 0–0)
SEG: 13 % — ABNORMAL LOW (ref 38–77)
Variant Lymph: 0 % (ref 0–0)
nRBC: 25 % — ABNORMAL HIGH (ref 0–0)

## 2013-12-11 LAB — CBC WITH DIFFERENTIAL/PLATELET
HCT: 33.6 % — ABNORMAL LOW (ref 34.8–46.6)
HGB: 10.2 g/dL — ABNORMAL LOW (ref 11.6–15.9)
MCH: 28.6 pg (ref 25.1–34.0)
MCHC: 30.4 g/dL — ABNORMAL LOW (ref 31.5–36.0)
MCV: 94.1 fL (ref 79.5–101.0)
Platelets: 53 10*3/uL — ABNORMAL LOW (ref 145–400)
RBC: 3.57 10*6/uL — ABNORMAL LOW (ref 3.70–5.45)
RDW: 16.2 % — AB (ref 11.2–14.5)
WBC: 8 10*3/uL (ref 3.9–10.3)

## 2013-12-11 MED ORDER — ONDANSETRON 8 MG/NS 50 ML IVPB
INTRAVENOUS | Status: AC
  Filled 2013-12-11: qty 8

## 2013-12-11 MED ORDER — DEXAMETHASONE SODIUM PHOSPHATE 20 MG/5ML IJ SOLN
20.0000 mg | Freq: Once | INTRAMUSCULAR | Status: AC
  Administered 2013-12-11: 20 mg via INTRAVENOUS

## 2013-12-11 MED ORDER — HEPARIN SOD (PORK) LOCK FLUSH 100 UNIT/ML IV SOLN
500.0000 [IU] | Freq: Once | INTRAVENOUS | Status: AC | PRN
  Administered 2013-12-11: 500 [IU]
  Filled 2013-12-11: qty 5

## 2013-12-11 MED ORDER — FAMOTIDINE IN NACL 20-0.9 MG/50ML-% IV SOLN
INTRAVENOUS | Status: AC
  Filled 2013-12-11: qty 50

## 2013-12-11 MED ORDER — SODIUM CHLORIDE 0.9 % IV SOLN
Freq: Once | INTRAVENOUS | Status: AC
  Administered 2013-12-11: 14:00:00 via INTRAVENOUS

## 2013-12-11 MED ORDER — DIPHENHYDRAMINE HCL 50 MG/ML IJ SOLN
25.0000 mg | Freq: Once | INTRAMUSCULAR | Status: AC
  Administered 2013-12-11: 25 mg via INTRAVENOUS

## 2013-12-11 MED ORDER — DEXAMETHASONE SODIUM PHOSPHATE 20 MG/5ML IJ SOLN
INTRAMUSCULAR | Status: AC
  Filled 2013-12-11: qty 5

## 2013-12-11 MED ORDER — ONDANSETRON 8 MG/50ML IVPB (CHCC)
8.0000 mg | Freq: Once | INTRAVENOUS | Status: AC
  Administered 2013-12-11: 8 mg via INTRAVENOUS

## 2013-12-11 MED ORDER — DIPHENHYDRAMINE HCL 50 MG/ML IJ SOLN
INTRAMUSCULAR | Status: AC
  Filled 2013-12-11: qty 1

## 2013-12-11 MED ORDER — SODIUM CHLORIDE 0.9 % IV SOLN
80.0000 mg/m2 | Freq: Once | INTRAVENOUS | Status: AC
  Administered 2013-12-11: 114 mg via INTRAVENOUS
  Filled 2013-12-11: qty 19

## 2013-12-11 MED ORDER — FAMOTIDINE IN NACL 20-0.9 MG/50ML-% IV SOLN
20.0000 mg | Freq: Once | INTRAVENOUS | Status: AC
  Administered 2013-12-11: 20 mg via INTRAVENOUS

## 2013-12-11 MED ORDER — SODIUM CHLORIDE 0.9 % IJ SOLN
10.0000 mL | INTRAMUSCULAR | Status: DC | PRN
  Administered 2013-12-11: 10 mL
  Filled 2013-12-11: qty 10

## 2013-12-11 NOTE — Progress Notes (Signed)
Okay to treat today per Dr. Humphrey Rolls with Hgb 8.0 and platelet count 53. Cindi Carbon, RN

## 2013-12-11 NOTE — Patient Instructions (Addendum)
[facility] Discharge Instructions for Patients Receiving Chemotherapy  Today you received the following chemotherapy agents: taxol  To help prevent nausea and vomiting after your treatment, we encourage you to take your nausea medication.  Take it as often as prescribed.     If you develop nausea and vomiting that is not controlled by your nausea medication, call the clinic. If it is after clinic hours your family physician or the after hours number for the clinic or go to the Emergency Department.   BELOW ARE SYMPTOMS THAT SHOULD BE REPORTED IMMEDIATELY:  *FEVER GREATER THAN 100.5 F  *CHILLS WITH OR WITHOUT FEVER  NAUSEA AND VOMITING THAT IS NOT CONTROLLED WITH YOUR NAUSEA MEDICATION  *UNUSUAL SHORTNESS OF BREATH  *UNUSUAL BRUISING OR BLEEDING  TENDERNESS IN MOUTH AND THROAT WITH OR WITHOUT PRESENCE OF ULCERS  *URINARY PROBLEMS  *BOWEL PROBLEMS  UNUSUAL RASH Items with * indicate a potential emergency and should be followed up as soon as possible.  Feel free to call the clinic you have any questions or concerns. The clinic phone number is [phone].

## 2013-12-17 ENCOUNTER — Other Ambulatory Visit: Payer: Self-pay | Admitting: *Deleted

## 2013-12-17 DIAGNOSIS — D696 Thrombocytopenia, unspecified: Secondary | ICD-10-CM

## 2013-12-17 DIAGNOSIS — C7951 Secondary malignant neoplasm of bone: Secondary | ICD-10-CM

## 2013-12-18 ENCOUNTER — Encounter: Payer: Self-pay | Admitting: *Deleted

## 2013-12-18 ENCOUNTER — Ambulatory Visit (HOSPITAL_BASED_OUTPATIENT_CLINIC_OR_DEPARTMENT_OTHER): Payer: 59

## 2013-12-18 ENCOUNTER — Encounter: Payer: Self-pay | Admitting: Oncology

## 2013-12-18 ENCOUNTER — Other Ambulatory Visit: Payer: Self-pay | Admitting: *Deleted

## 2013-12-18 ENCOUNTER — Ambulatory Visit (HOSPITAL_BASED_OUTPATIENT_CLINIC_OR_DEPARTMENT_OTHER): Payer: 59 | Admitting: Oncology

## 2013-12-18 ENCOUNTER — Other Ambulatory Visit (HOSPITAL_BASED_OUTPATIENT_CLINIC_OR_DEPARTMENT_OTHER): Payer: 59

## 2013-12-18 VITALS — BP 127/77 | HR 135 | Temp 98.1°F | Resp 20 | Ht 63.0 in | Wt 99.3 lb

## 2013-12-18 DIAGNOSIS — C773 Secondary and unspecified malignant neoplasm of axilla and upper limb lymph nodes: Secondary | ICD-10-CM

## 2013-12-18 DIAGNOSIS — D6959 Other secondary thrombocytopenia: Secondary | ICD-10-CM

## 2013-12-18 DIAGNOSIS — C50119 Malignant neoplasm of central portion of unspecified female breast: Secondary | ICD-10-CM

## 2013-12-18 DIAGNOSIS — D649 Anemia, unspecified: Secondary | ICD-10-CM

## 2013-12-18 DIAGNOSIS — C50919 Malignant neoplasm of unspecified site of unspecified female breast: Secondary | ICD-10-CM

## 2013-12-18 DIAGNOSIS — Z5111 Encounter for antineoplastic chemotherapy: Secondary | ICD-10-CM

## 2013-12-18 DIAGNOSIS — E46 Unspecified protein-calorie malnutrition: Secondary | ICD-10-CM

## 2013-12-18 DIAGNOSIS — C7952 Secondary malignant neoplasm of bone marrow: Secondary | ICD-10-CM

## 2013-12-18 DIAGNOSIS — C7951 Secondary malignant neoplasm of bone: Secondary | ICD-10-CM

## 2013-12-18 DIAGNOSIS — D696 Thrombocytopenia, unspecified: Secondary | ICD-10-CM

## 2013-12-18 DIAGNOSIS — Z170 Estrogen receptor positive status [ER+]: Secondary | ICD-10-CM

## 2013-12-18 LAB — COMPREHENSIVE METABOLIC PANEL (CC13)
ALBUMIN: 3.4 g/dL — AB (ref 3.5–5.0)
ALK PHOS: 235 U/L — AB (ref 40–150)
ALT: 20 U/L (ref 0–55)
AST: 30 U/L (ref 5–34)
Anion Gap: 11 mEq/L (ref 3–11)
BUN: 16.5 mg/dL (ref 7.0–26.0)
CALCIUM: 8.8 mg/dL (ref 8.4–10.4)
CHLORIDE: 103 meq/L (ref 98–109)
CO2: 24 mEq/L (ref 22–29)
Creatinine: 0.6 mg/dL (ref 0.6–1.1)
GLUCOSE: 112 mg/dL (ref 70–140)
POTASSIUM: 4.4 meq/L (ref 3.5–5.1)
SODIUM: 137 meq/L (ref 136–145)
TOTAL PROTEIN: 7 g/dL (ref 6.4–8.3)
Total Bilirubin: 0.5 mg/dL (ref 0.20–1.20)

## 2013-12-18 LAB — MANUAL DIFFERENTIAL
ALC: 2.3 10*3/uL (ref 0.9–3.3)
ANC (CHCC manual diff): 1.1 10*3/uL — ABNORMAL LOW (ref 1.5–6.5)
BASOPHIL: 6 % — AB (ref 0–2)
BLASTS: 2 % — AB (ref 0–0)
Band Neutrophils: 5 % (ref 0–10)
EOS: 4 % (ref 0–7)
LYMPH: 45 % (ref 14–49)
METAMYELOCYTES PCT: 6 % — AB (ref 0–0)
MONO: 19 % — AB (ref 0–14)
Myelocytes: 2 % — ABNORMAL HIGH (ref 0–0)
Other Cell: 0 % (ref 0–0)
PLT EST: DECREASED
PROMYELO: 2 % — ABNORMAL HIGH (ref 0–0)
SEG: 9 % — ABNORMAL LOW (ref 38–77)
Variant Lymph: 0 % (ref 0–0)
nRBC: 19 % — ABNORMAL HIGH (ref 0–0)

## 2013-12-18 LAB — CBC WITH DIFFERENTIAL/PLATELET
HEMATOCRIT: 34.7 % — AB (ref 34.8–46.6)
HGB: 10.8 g/dL — ABNORMAL LOW (ref 11.6–15.9)
MCH: 28.8 pg (ref 25.1–34.0)
MCHC: 31.1 g/dL — AB (ref 31.5–36.0)
MCV: 92.5 fL (ref 79.5–101.0)
Platelets: 31 10*3/uL — ABNORMAL LOW (ref 145–400)
RBC: 3.75 10*6/uL (ref 3.70–5.45)
RDW: 16.9 % — ABNORMAL HIGH (ref 11.2–14.5)
WBC: 5.2 10*3/uL (ref 3.9–10.3)

## 2013-12-18 MED ORDER — ONDANSETRON 8 MG/50ML IVPB (CHCC)
8.0000 mg | Freq: Once | INTRAVENOUS | Status: AC
  Administered 2013-12-18: 8 mg via INTRAVENOUS

## 2013-12-18 MED ORDER — HEPARIN SOD (PORK) LOCK FLUSH 100 UNIT/ML IV SOLN
500.0000 [IU] | Freq: Once | INTRAVENOUS | Status: AC | PRN
  Administered 2013-12-18: 500 [IU]
  Filled 2013-12-18: qty 5

## 2013-12-18 MED ORDER — SODIUM CHLORIDE 0.9 % IV SOLN
Freq: Once | INTRAVENOUS | Status: AC
  Administered 2013-12-18: 10:00:00 via INTRAVENOUS

## 2013-12-18 MED ORDER — FAMOTIDINE IN NACL 20-0.9 MG/50ML-% IV SOLN
20.0000 mg | Freq: Once | INTRAVENOUS | Status: AC
  Administered 2013-12-18: 20 mg via INTRAVENOUS

## 2013-12-18 MED ORDER — FAMOTIDINE IN NACL 20-0.9 MG/50ML-% IV SOLN
INTRAVENOUS | Status: AC
  Filled 2013-12-18: qty 50

## 2013-12-18 MED ORDER — DEXAMETHASONE SODIUM PHOSPHATE 20 MG/5ML IJ SOLN
20.0000 mg | Freq: Once | INTRAMUSCULAR | Status: AC
  Administered 2013-12-18: 20 mg via INTRAVENOUS

## 2013-12-18 MED ORDER — SODIUM CHLORIDE 0.9 % IJ SOLN
10.0000 mL | INTRAMUSCULAR | Status: DC | PRN
  Administered 2013-12-18: 10 mL
  Filled 2013-12-18: qty 10

## 2013-12-18 MED ORDER — HYDROCODONE-ACETAMINOPHEN 5-325 MG PO TABS: 1.0000 | ORAL_TABLET | Freq: Four times a day (QID) | ORAL | Status: DC | PRN

## 2013-12-18 MED ORDER — ONDANSETRON 8 MG/NS 50 ML IVPB
INTRAVENOUS | Status: AC
  Filled 2013-12-18: qty 8

## 2013-12-18 MED ORDER — PACLITAXEL CHEMO INJECTION 300 MG/50ML
60.0000 mg/m2 | Freq: Once | INTRAVENOUS | Status: AC
  Administered 2013-12-18: 84 mg via INTRAVENOUS
  Filled 2013-12-18: qty 14

## 2013-12-18 MED ORDER — DIPHENHYDRAMINE HCL 50 MG/ML IJ SOLN
INTRAMUSCULAR | Status: AC
Start: 2013-12-18 — End: 2013-12-18
  Filled 2013-12-18: qty 1

## 2013-12-18 MED ORDER — DEXAMETHASONE SODIUM PHOSPHATE 20 MG/5ML IJ SOLN
INTRAMUSCULAR | Status: AC
  Filled 2013-12-18: qty 5

## 2013-12-18 MED ORDER — DIPHENHYDRAMINE HCL 50 MG/ML IJ SOLN
25.0000 mg | Freq: Once | INTRAMUSCULAR | Status: AC
  Administered 2013-12-18: 25 mg via INTRAVENOUS

## 2013-12-18 NOTE — Patient Instructions (Addendum)
[facility] Discharge Instructions for Patients Receiving Chemotherapy  Today you received the following chemotherapy agents taxol.  To help prevent nausea and vomiting after your treatment, we encourage you to take your nausea medication zofran.   If you develop nausea and vomiting that is not controlled by your nausea medication, call the clinic.   BELOW ARE SYMPTOMS THAT SHOULD BE REPORTED IMMEDIATELY:  *FEVER GREATER THAN 100.5 F  *CHILLS WITH OR WITHOUT FEVER  NAUSEA AND VOMITING THAT IS NOT CONTROLLED WITH YOUR NAUSEA MEDICATION  *UNUSUAL SHORTNESS OF BREATH  *UNUSUAL BRUISING OR BLEEDING  TENDERNESS IN MOUTH AND THROAT WITH OR WITHOUT PRESENCE OF ULCERS  *URINARY PROBLEMS  *BOWEL PROBLEMS  UNUSUAL RASH Items with * indicate a potential emergency and should be followed up as soon as possible.  Feel free to call the clinic you have any questions or concerns. The clinic phone number is [phone].    

## 2013-12-18 NOTE — Progress Notes (Signed)
OK to treat per Dr. Humphrey Rolls with platelets 31.

## 2013-12-22 NOTE — Progress Notes (Signed)
OFFICE PROGRESS NOTE  CC  Gina Kroner, MD [address], [address] [place] [address] Dr. 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 Diagnosed in October 2007  PRIOR THERAPY:  #1 patient presented with a mass in her right breast in October 2007. She underwent bilateral mammograms. The right breast ultrasound performed on 08/19/2006 showed increased density and distortion in the subareolar location of the right breast. Ultrasound showed abnormal echoes in the subareolar location which was a change measuring 1.5 cm. Patient underwent an ultrasound-guided biopsy on 08/23/2006 which showed invasive mammary carcinoma with lobular features. ER +100% PR 3% proliferation marker Ki-67 12% HER-2/neu 2+ by fish. MRI of the breasts on 08/30/2006 showed an ill-defined enhancing mass in the right breast measuring 2.6 x 4.0 x 4.1 cm. Enhancement trailed out to the nipple but did not involve the nipple. There was also a small enhancing internal mammary node measuring 7 mm suspicious for metastatic disease. Left breast was negative.  #2 patient was seen by Dr. Rich Reining on 09/09/2006. She received neoadjuvant chemotherapy initially consisting of FEC regimen given dose dense for 4 cycles followed by Taxotere. She received her chemotherapy she received her chemotherapy from 09/20/2006 through February 2008. She then went on to lumpectomy on 02/07/2007. However there was essentially residual tumor dispersed over an area 1.8 cm with some close surgical margins at the medial area. Her case was reviewed at the breast conference and it was suggested she undergo mastectomy. Patient underwent a mastectomy on 03/19/2007.  #3 she was subsequently seen by radiation oncology for consideration of post mastectomy radiation therapy. However, she was not given radiation.  #4 she was then begun on Femara 2.5  mg daily. She has now completed 5 years of therapy. It was discontinued in June 2014.    #5 Returned to clinic on 08/05/13 for evaluation and management of new anemia.  She has underwent PET/CT chest abdomen and pelvis which reveals diffuse lytic lesions throughout the axial and appendicular skeleton.  Bone marrow biopsy was consistent with metastatic ER/PR positive breast cancer.    #6 status post Arimidex  Unfortunately she had progressive disease. Patient sought second opinion at [facility] [place] in [place]. They have recommended single agent Taxol.  CURRENT THERAPY: palliative cycle 5 Taxol weekly/Xgeva monthly  INTERVAL HISTORY: Gina [name] 68 y.o. female returns for follow up prior to chemotherapy. She has been receiving weekly Taxol as well as xgeva.  thus far she is tolerating well. 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 This is an ongoing concern. But she does not having any bleeding. On her last visit she did have dose attenuation.  today she is accompanied by her family members. She is denying having any nausea vomiting fevers or chills. Her appetite does improving. She is in good spirits. She has no aches or pains her pains are well controlled with her present pain medications. Remainder of the review of systems is negative.   MEDICAL HISTORY: Past Medical History  Diagnosis Date  . Breast cancer 04/28/2013  . Myalgia and myositis, unspecified   . Anemia     ALLERGIES:  is allergic to sudafed.  MEDICATIONS:  Current Outpatient Prescriptions  Medication Sig Dispense Refill  . cetirizine (ZYRTEC) 10 MG tablet Take 10 mg by mouth daily.        . Cholecalciferol (VITAMIN D3) 5000 UNITS TABS Take 5,000 mg by mouth daily.      Marland Kitchen  dronabinol (MARINOL) 2.5 MG capsule Take 1 capsule (2.5 mg total) by mouth 2 (two) times daily before a meal.  60 capsule  1  . lidocaine-prilocaine (EMLA) cream Apply 1 application topically as needed.  30 g  1  . [place] Mushroom POWD 5 mLs by Does not  apply route daily.      . Multiple Vitamins-Minerals (OSTEO COMPLEX PO) Take by mouth.      Marland Kitchen omeprazole (PRILOSEC) 40 MG capsule Take 40 mg by mouth daily.      Marland Kitchen venlafaxine XR (EFFEXOR-XR) 37.5 MG 24 hr capsule Take 1 capsule (37.5 mg total) by mouth daily with breakfast.  30 capsule  9  . anastrozole (ARIMIDEX) 1 MG tablet       . HYDROcodone-acetaminophen (NORCO/VICODIN) 5-325 MG per tablet Take 1 tablet by mouth every 6 (six) hours as needed for moderate pain.  30 tablet  0  . meclizine (ANTIVERT) 25 MG tablet Take 1 tablet (25 mg total) by mouth 3 (three) times daily as needed for dizziness.  30 tablet  0  . mirtazapine (REMERON) 15 MG tablet       . ondansetron (ZOFRAN) 4 MG tablet Take 1 tablet (4 mg total) by mouth every 6 (six) hours as needed for nausea.  20 tablet  0  . traMADol (ULTRAM) 50 MG tablet Take 0.5-1 tablets (25-50 mg total) by mouth every 6 (six) hours as needed for pain.  120 tablet  0   No current facility-administered medications for this visit.    SURGICAL HISTORY:  Past Surgical History  Procedure Laterality Date  . Mastectomy Right   . Tubal ligation  1971    REVIEW OF SYSTEMS:  A 10 point review of systems was conducted and is otherwise negative except for what is noted above.    PHYSICAL EXAMINATION: Blood pressure 136/82, pulse 130, temperature 97.9 F (36.6 C), temperature source Oral, resp. rate 18, height 5' 3"  (1.6 m), weight 97 lb 8 oz (44.226 kg). Body mass index is 17.28 kg/(m^2). General: Patient is a well appearing female, in wheelchair, improved from last exam, weight improved by 4 lbs HEENT: PERRLA, sclerae anicteric, conjunctival pallor, MMM Neck: supple, no palpable adenopathy Lungs: clear to auscultation bilaterally, no wheezes, rhonchi, or rales Cardiovascular: regular rate rhythm, S1, S2, no murmurs, rubs or gallops Abdomen: Soft, non-tender, non-distended, normoactive bowel sounds, no HSM Extremities: warm and well perfused, no  clubbing, cyanosis, or edema Skin:  No rashes or lesions Neuro: Non-focal Breasts: deferred ECOG PERFORMANCE STATUS: 0 - Asymptomatic  LABORATORY DATA: Lab Results  Component Value Date   WBC 5.2 12/18/2013   HGB 10.8* 12/18/2013   HCT 34.7* 12/18/2013   MCV 92.5 12/18/2013   PLT 31* 12/18/2013      Chemistry      Component Value Date/Time   NA 137 12/18/2013 0747   NA 140 11/04/2013 0454   K 4.4 12/18/2013 0747   K 3.8 11/04/2013 0454   CL 108 11/04/2013 0454   CL 105 04/17/2013 1323   CO2 24 12/18/2013 0747   CO2 23 11/04/2013 0454   BUN 16.5 12/18/2013 0747   BUN 8 11/04/2013 0454   CREATININE 0.6 12/18/2013 0747   CREATININE 0.43* 11/04/2013 0454      Component Value Date/Time   CALCIUM 8.8 12/18/2013 0747   CALCIUM 7.3* 11/04/2013 0454   ALKPHOS 235* 12/18/2013 0747   ALKPHOS 224* 11/03/2013 0548   AST 30 12/18/2013 0747   AST 31 11/03/2013 0548  ALT 20 12/18/2013 0747   ALT 13 11/03/2013 0548   BILITOT 0.50 12/18/2013 0747   BILITOT 0.4 11/03/2013 0548    REPORT OF SURGICAL PATHOLOGY  Case #: [id number] Patient Name: DEIRA, SHIMER. Office Chart Number: N/A  MRN: [id number] Pathologist: Violet Baldy, MD DOB/Age [birth year] (Age: 44) Gender: F Date Taken: 02/07/2007 Date Received: 02/07/2007  FINAL DIAGNOSIS  MICROSCOPIC EXAMINATION AND DIAGNOSIS  1. RIGHT AXILLARY SENTINEL LYMPH NODE, EXCISION: - METASTATIC CARCINOMA CONSISTENT WITH BREAST PRIMARY, SEE COMMENT.  2. RIGHT BREAST, NEEDLE LOCALIZATION BIOPSY: - RESIDUAL CARCINOMA IN TUMOR REGRESSION AREA. - CARCINOMA IS CLOSE TO MEDIAL AND LATERAL SURGICAL MARGINS OF RESECTION. - SEE ONCOLOGY TABLE  COMMENT 1. The sections show metastatic carcinoma on H The tumor is seen in the form of dispersed atypical epithelioid cells in a fibrotic background involving an area more than 0.2 cm consistent with metastatic carcinoma. No extranodal extension is identified. For confirmation, Cytokeratin stain (AE1/AE3)  was performed and is positive. The control stained appropriately.  ONCOLOGY TABLE-BREAST, INCISIONAL/EXCISIONAL BIOPSY OR MASTECTOMY WITH LYMPH NODES  1. Maximum tumor size (cm): Dispersed tumor cells in a tumor regression area measuring 1.8 cm, see comment 2. Margins: Tumor cells are seen less than 0.1 cm from the surgical margins of resection Invasive component, distance to closest margin: Less than 0.1 cm In situ component, distance to closest margin: N/A 3. Vascular/Lymphatic invasion: Yes 4. Histology, invasive component: Invasive mammary carcinoma consistent with lobular carcinoma 5. Grade, invasive component (Elston-Ellis modified Scarff-Bloom-Richardson): II Tubule formation grade: 3 Nuclear pleomorphism grade: 2 Mitotic grade: 1 6. Extensive intraductal component (JCRT): No 7. Histology, in situ component: N/A 8. Grade, in situ component: N/A 9. Multicentric (separate tumors in different quadrants): Unknown 10. Multifocal (separate tumors in same quadrant or biopsy): No 11. Axillary lymph nodes: #examined: 1 #with metastasis: 1 ITC (isolated tumor cells, < 0.37m): 0 Micrometastasis (> 0.271m < 41m48m 0 Metastasis > 2 mm: 1 Extracapsular extension: No 12. TNM Code: ypT1, pN1a, pMX 13. Breast Prognostic Markers: Case number [id number] receptor positive (100%) Progesterone receptor positive (3%) Ki 67 (Mib-1) 12% Her 2 neu (HercepTest) 2+ Her 2 neu by FISH N/A 14. Non-neoplastic breast: Fibrosis 15. Comments: Sections of the localized tissue show a fibrotic area measuring 1.8 cm and displaying dispersed atypical epithelioid cells throughout that area. Some of the cells surround the benign appearing ductal elements. The findings are subtle and most compatible with residual carcinoma. For confirmation cytokeratin stains were performed on several blocks representing the localized tissue and show strong positivity in previously described epithelioid cells  confirming the morphologic impression of residual carcinoma. The carcinoma is less than 0.1 cm from the medial surgical margin of resection and approximately 0.1 cm from the lateral surgical margin of resection. Since the findings are subtle and the tumor is essentially composed of dispersed cells throughout, the status of the medial and lateral margins in particular may be questionable and hence truly negative margins cannot be assured. I strongly recommend clinical correlation and followup. (BNS:caf 02/12/07)  RADIOGRAPHIC STUDIES:  No results found.  ASSESSMENT: [age]ar old female with  #1 history of stage II invasive mammary carcinoma with lobular features originally diagnosed in 2007. She underwent neoadjuvant chemotherapy consisting of 4 cycles of dose dense FEC followed by dose dense Taxotere for 4 cycles. She then had a lumpectomy that revealed residual disease. She proceeded on to have a mastectomy. She was not a candidate for radiation therapy even though she did have an evaluation by  radiation oncology.  #2 Dr. Truddie Coco placed her on Femara 2.5 mg daily she overall tolerated it well. She completed 5 years of Femara. She discontinued this on 04/28/2013.  #3 Anemia work up started on 08/05/13.  After CT chest abd pelvis, PET scan, and bone marrow biopsy, patient was found to have metastatic breast cancer to the bone.  She will receive Xgeva every four weeks and take Arimidex daily.    #4 patient will begin Taxol 80 mg per meter squared q. 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 Risks benefits and side effects of treatment were discussed with the patient.  #2 patient will proceed with weekly Taxol on Friday as well as  xgeva.  #3 protein calorie malnutrition: encourage increasing her by mouth intake  #4 patient will  return in one week's time for followup and next dose of Taxol   All questions were answered. The patient knows to call the clinic with any problems, questions or concerns. We can certainly see the patient much sooner if necessary.  I spent 25 minutes counseling the patient face to face. The total time spent in the appointment was 30 minutes.    Marcy Panning, MD Medical/Oncology [facility] [phone] (beeper) [phone] (Office)

## 2013-12-24 ENCOUNTER — Other Ambulatory Visit: Payer: Self-pay | Admitting: Emergency Medicine

## 2013-12-24 DIAGNOSIS — C7951 Secondary malignant neoplasm of bone: Secondary | ICD-10-CM

## 2013-12-24 DIAGNOSIS — C50919 Malignant neoplasm of unspecified site of unspecified female breast: Secondary | ICD-10-CM

## 2013-12-25 ENCOUNTER — Ambulatory Visit: Payer: 59

## 2013-12-25 ENCOUNTER — Ambulatory Visit (HOSPITAL_BASED_OUTPATIENT_CLINIC_OR_DEPARTMENT_OTHER): Payer: 59 | Admitting: Oncology

## 2013-12-25 ENCOUNTER — Other Ambulatory Visit (HOSPITAL_BASED_OUTPATIENT_CLINIC_OR_DEPARTMENT_OTHER): Payer: 59

## 2013-12-25 ENCOUNTER — Encounter: Payer: Self-pay | Admitting: Oncology

## 2013-12-25 VITALS — BP 120/76 | HR 123 | Temp 98.3°F | Resp 18 | Ht 63.0 in | Wt 102.4 lb

## 2013-12-25 DIAGNOSIS — D6959 Other secondary thrombocytopenia: Secondary | ICD-10-CM

## 2013-12-25 DIAGNOSIS — C50919 Malignant neoplasm of unspecified site of unspecified female breast: Secondary | ICD-10-CM

## 2013-12-25 DIAGNOSIS — E46 Unspecified protein-calorie malnutrition: Secondary | ICD-10-CM

## 2013-12-25 DIAGNOSIS — Z170 Estrogen receptor positive status [ER+]: Secondary | ICD-10-CM

## 2013-12-25 DIAGNOSIS — D696 Thrombocytopenia, unspecified: Secondary | ICD-10-CM

## 2013-12-25 DIAGNOSIS — C50119 Malignant neoplasm of central portion of unspecified female breast: Secondary | ICD-10-CM

## 2013-12-25 DIAGNOSIS — C7952 Secondary malignant neoplasm of bone marrow: Secondary | ICD-10-CM

## 2013-12-25 DIAGNOSIS — C7951 Secondary malignant neoplasm of bone: Secondary | ICD-10-CM

## 2013-12-25 DIAGNOSIS — G8929 Other chronic pain: Secondary | ICD-10-CM

## 2013-12-25 LAB — CBC WITH DIFFERENTIAL/PLATELET
HEMATOCRIT: 33.1 % — AB (ref 34.8–46.6)
HGB: 10.4 g/dL — ABNORMAL LOW (ref 11.6–15.9)
MCH: 28.7 pg (ref 25.1–34.0)
MCHC: 31.4 g/dL — ABNORMAL LOW (ref 31.5–36.0)
MCV: 91.2 fL (ref 79.5–101.0)
PLATELETS: 21 10*3/uL — AB (ref 145–400)
RBC: 3.63 10*6/uL — AB (ref 3.70–5.45)
RDW: 17.6 % — ABNORMAL HIGH (ref 11.2–14.5)
WBC: 5.9 10*3/uL (ref 3.9–10.3)

## 2013-12-25 LAB — MANUAL DIFFERENTIAL
ALC: 1.8 10*3/uL (ref 0.9–3.3)
ANC (CHCC manual diff): 2.5 10*3/uL (ref 1.5–6.5)
Band Neutrophils: 9 % (ref 0–10)
Basophil: 6 % — ABNORMAL HIGH (ref 0–2)
Blasts: 2 % — ABNORMAL HIGH (ref 0–0)
EOS%: 4 % (ref 0–7)
LYMPH: 30 % (ref 14–49)
MONO: 14 % (ref 0–14)
MYELOCYTES: 11 % — AB (ref 0–0)
Metamyelocytes: 12 % — ABNORMAL HIGH (ref 0–0)
NRBC: 26 % — AB (ref 0–0)
OTHER CELL: 0 % (ref 0–0)
PLT EST: DECREASED
PROMYELO: 1 % — ABNORMAL HIGH (ref 0–0)
SEG: 11 % — AB (ref 38–77)
Variant Lymph: 0 % (ref 0–0)

## 2013-12-25 NOTE — Progress Notes (Signed)
OFFICE PROGRESS NOTE  CC  Gina Kroner, MD [place], [address] [place] [address] Dr. 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 Diagnosed in October 2007  PRIOR THERAPY:  #1 patient presented with a mass in her right breast in October 2007. She underwent bilateral mammograms. The right breast ultrasound performed on 08/19/2006 showed increased density and distortion in the subareolar location of the right breast. Ultrasound showed abnormal echoes in the subareolar location which was a change measuring 1.5 cm. Patient underwent an ultrasound-guided biopsy on 08/23/2006 which showed invasive mammary carcinoma with lobular features. ER +100% PR 3% proliferation marker Ki-67 12% HER-2/neu 2+ by fish. MRI of the breasts on 08/30/2006 showed an ill-defined enhancing mass in the right breast measuring 2.6 x 4.0 x 4.1 cm. Enhancement trailed out to the nipple but did not involve the nipple. There was also a small enhancing internal mammary node measuring 7 mm suspicious for metastatic disease. Left breast was negative.  #2 patient was seen by Dr. Rich [name] on 09/09/2006. She received neoadjuvant chemotherapy initially consisting of FEC regimen given dose dense for 4 cycles followed by Taxotere. She received her chemotherapy she received her chemotherapy from 09/20/2006 through February 2008. She then went on to lumpectomy on 02/07/2007. However there was essentially residual tumor dispersed over an area 1.8 cm with some close surgical margins at the medial area. Her case was reviewed at the breast conference and it was suggested she undergo mastectomy. Patient underwent a mastectomy on 03/19/2007.  #3 she was subsequently seen by radiation oncology for consideration of post mastectomy radiation therapy. However, she was not given radiation.  #4 she was then begun on Femara 2.5  mg daily. She has now completed 5 years of therapy. It was discontinued in June 2014.    #5 Returned to clinic on 08/05/13 for evaluation and management of new anemia.  She has underwent PET/CT chest abdomen and pelvis which reveals diffuse lytic lesions throughout the axial and appendicular skeleton.  Bone marrow biopsy was consistent with metastatic ER/PR positive breast cancer.    #6 status post Arimidex  Unfortunately she had progressive disease. Patient sought second opinion at [facility] [place] in [place]. They have recommended single agent Taxol.  CURRENT THERAPY: palliative  Taxol weekly/Xgeva monthly  INTERVAL HISTORY: Gina [name] 68 y.o. female returns for follow up prior to chemotherapy. She has been receiving weekly Taxol as well as xgeva.  thus far she is tolerating well. 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 This is an ongoing concern. But she does not having any bleeding. On her last visit she did have dose attenuation.  today she is accompanied by her family members. She is denying having any nausea vomiting fevers or chills. Her appetite does improving. She is in good spirits. She has no aches or pains her pains are well controlled with her present pain medications. She looks perfect. However her platelets have gone down further to 21,000. I have recommended not giving him chemotherapy today. She has no bleeding problems. Remainder of the review of systems is negative.   MEDICAL HISTORY: Past Medical History  Diagnosis Date  . Breast cancer 04/28/2013  . Myalgia and myositis, unspecified   . Anemia     ALLERGIES:  is allergic to sudafed.  MEDICATIONS:  Current Outpatient Prescriptions  Medication Sig Dispense Refill  . cetirizine (ZYRTEC) 10 MG tablet Take 10 mg by mouth  daily.        . Cholecalciferol (VITAMIN D3) 5000 UNITS TABS Take 5,000 mg by mouth daily.      Marland Kitchen dronabinol (MARINOL) 2.5 MG capsule Take 1 capsule (2.5 mg total) by mouth 2 (two) times daily before a  meal.  60 capsule  1  . HYDROcodone-acetaminophen (NORCO/VICODIN) 5-325 MG per tablet Take 1 tablet by mouth every 6 (six) hours as needed for moderate pain.  30 tablet  0  . lidocaine-prilocaine (EMLA) cream Apply 1 application topically as needed.  30 g  1  . [place] Mushroom POWD 5 mLs by Does not apply route daily.      . mirtazapine (REMERON) 15 MG tablet       . Multiple Vitamins-Minerals (OSTEO COMPLEX PO) Take by mouth.      Marland Kitchen omeprazole (PRILOSEC) 40 MG capsule Take 40 mg by mouth daily.      . traMADol (ULTRAM) 50 MG tablet Take 0.5-1 tablets (25-50 mg total) by mouth every 6 (six) hours as needed for pain.  120 tablet  0  . venlafaxine XR (EFFEXOR-XR) 37.5 MG 24 hr capsule Take 1 capsule (37.5 mg total) by mouth daily with breakfast.  30 capsule  9  . meclizine (ANTIVERT) 25 MG tablet Take 1 tablet (25 mg total) by mouth 3 (three) times daily as needed for dizziness.  30 tablet  0  . ondansetron (ZOFRAN) 4 MG tablet Take 1 tablet (4 mg total) by mouth every 6 (six) hours as needed for nausea.  20 tablet  0   No current facility-administered medications for this visit.    SURGICAL HISTORY:  Past Surgical History  Procedure Laterality Date  . Mastectomy Right   . Tubal ligation  1971    REVIEW OF SYSTEMS:  A 10 point review of systems was conducted and is otherwise negative except for what is noted above.    PHYSICAL EXAMINATION: Blood pressure 120/76, pulse 123, temperature 98.3 F (36.8 C), temperature source Oral, resp. rate 18, height 5' 3"  (1.6 m), weight 102 lb 6.4 oz (46.448 kg). Body mass index is 18.14 kg/(m^2). General: Patient is a well appearing female, in wheelchair, improved from last exam, weight improved by 4 lbs HEENT: PERRLA, sclerae anicteric, conjunctival pallor, MMM Neck: supple, no palpable adenopathy Lungs: clear to auscultation bilaterally, no wheezes, rhonchi, or rales Cardiovascular: regular rate rhythm, S1, S2, no murmurs, rubs or gallops Abdomen:  Soft, non-tender, non-distended, normoactive bowel sounds, no HSM Extremities: warm and well perfused, no clubbing, cyanosis, or edema Skin:  No rashes or lesions Neuro: Non-focal Breasts: deferred ECOG PERFORMANCE STATUS: 0 - Asymptomatic  LABORATORY DATA: Lab Results  Component Value Date   WBC 5.9 12/25/2013   HGB 10.4* 12/25/2013   HCT 33.1* 12/25/2013   MCV 91.2 12/25/2013   PLT 21* 12/25/2013      Chemistry      Component Value Date/Time   NA 137 12/18/2013 0747   NA 140 11/04/2013 0454   K 4.4 12/18/2013 0747   K 3.8 11/04/2013 0454   CL 108 11/04/2013 0454   CL 105 04/17/2013 1323   CO2 24 12/18/2013 0747   CO2 23 11/04/2013 0454   BUN 16.5 12/18/2013 0747   BUN 8 11/04/2013 0454   CREATININE 0.6 12/18/2013 0747   CREATININE 0.43* 11/04/2013 0454      Component Value Date/Time   CALCIUM 8.8 12/18/2013 0747   CALCIUM 7.3* 11/04/2013 0454   ALKPHOS 235* 12/18/2013 0747   ALKPHOS 224*  11/03/2013 0548   AST 30 12/18/2013 0747   AST 31 11/03/2013 0548   ALT 20 12/18/2013 0747   ALT 13 11/03/2013 0548   BILITOT 0.50 12/18/2013 0747   BILITOT 0.4 11/03/2013 0548    REPORT OF SURGICAL PATHOLOGY  Case #: [id number] Patient Name: Gina [name], Gina [name]. Office Chart Number: N/A  MRN: [id number] Pathologist: Violet Baldy, MD DOB/Age 09-07-1946 (Age: 67) Gender: F Date Taken: 02/07/2007 Date Received: 02/07/2007  FINAL DIAGNOSIS  MICROSCOPIC EXAMINATION AND DIAGNOSIS  1. RIGHT AXILLARY SENTINEL LYMPH NODE, EXCISION: - METASTATIC CARCINOMA CONSISTENT WITH BREAST PRIMARY, SEE COMMENT.  2. RIGHT BREAST, NEEDLE LOCALIZATION BIOPSY: - RESIDUAL CARCINOMA IN TUMOR REGRESSION AREA. - CARCINOMA IS CLOSE TO MEDIAL AND LATERAL SURGICAL MARGINS OF RESECTION. - SEE ONCOLOGY TABLE  COMMENT 1. The sections show metastatic carcinoma on H The tumor is seen in the form of dispersed atypical epithelioid cells in a fibrotic background involving an area more than 0.2 cm consistent with metastatic  carcinoma. No extranodal extension is identified. For confirmation, Cytokeratin stain (AE1/AE3) was performed and is positive. The control stained appropriately.  ONCOLOGY TABLE-BREAST, INCISIONAL/EXCISIONAL BIOPSY OR MASTECTOMY WITH LYMPH NODES  1. Maximum tumor size (cm): Dispersed tumor cells in a tumor regression area measuring 1.8 cm, see comment 2. Margins: Tumor cells are seen less than 0.1 cm from the surgical margins of resection Invasive component, distance to closest margin: Less than 0.1 cm In situ component, distance to closest margin: N/A 3. Vascular/Lymphatic invasion: Yes 4. Histology, invasive component: Invasive mammary carcinoma consistent with lobular carcinoma 5. Grade, invasive component (Elston-Ellis modified Scarff-Bloom-Richardson): II Tubule formation grade: 3 Nuclear pleomorphism grade: 2 Mitotic grade: 1 6. Extensive intraductal component (JCRT): No 7. Histology, in situ component: N/A 8. Grade, in situ component: N/A 9. Multicentric (separate tumors in different quadrants): Unknown 10. Multifocal (separate tumors in same quadrant or biopsy): No 11. Axillary lymph nodes: #examined: 1 #with metastasis: 1 ITC (isolated tumor cells, < 0.43m): 0 Micrometastasis (> 0.283m < 11m59m 0 Metastasis > 2 mm: 1 Extracapsular extension: No 12. TNM Code: ypT1, pN1a, pMX 13. Breast Prognostic Markers: Case number [id number] receptor positive (100%) Progesterone receptor positive (3%) Ki 67 (Mib-1) 12% Her 2 neu (HercepTest) 2+ Her 2 neu by FISH N/A 14. Non-neoplastic breast: Fibrosis 15. Comments: Sections of the localized tissue show a fibrotic area measuring 1.8 cm and displaying dispersed atypical epithelioid cells throughout that area. Some of the cells surround the benign appearing ductal elements. The findings are subtle and most compatible with residual carcinoma. For confirmation cytokeratin stains were performed on several  blocks representing the localized tissue and show strong positivity in previously described epithelioid cells confirming the morphologic impression of residual carcinoma. The carcinoma is less than 0.1 cm from the medial surgical margin of resection and approximately 0.1 cm from the lateral surgical margin of resection. Since the findings are subtle and the tumor is essentially composed of dispersed cells throughout, the status of the medial and lateral margins in particular may be questionable and hence truly negative margins cannot be assured. I strongly recommend clinical correlation and followup. (BNS:caf 02/12/07)  RADIOGRAPHIC STUDIES:  No results found.  ASSESSMENTPLAN: [age]ar old female with  #1 history of stage II invasive mammary carcinoma with lobular features originally diagnosed in 2007. She underwent neoadjuvant chemotherapy consisting of 4 cycles of dose dense FEC followed by dose dense Taxotere for 4 cycles. She then had a lumpectomy that revealed residual disease. She proceeded on to have a mastectomy.  She was not a candidate for radiation therapy even though she did have an evaluation by radiation oncology.  #2 Dr. Truddie Coco placed her on Femara 2.5 mg daily she overall tolerated it well. She completed 5 years of Femara. She discontinued this on 04/28/2013.  #3 Anemia work up started on 08/05/13.  After CT chest abd pelvis, PET scan, and bone marrow biopsy, patient was found to have metastatic breast cancer to the bone.  She will receive Xgeva every four weeks and take Arimidex daily.    #4 patient will begin Taxol 80 mg per meter squared q. Weekly 3 weeks on one week off  #5 malnutrition: Patient has gained 3 pounds which is significantly better. She is eating.  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 Today platelets are only 21,000. We will hold her chemotherapy.  #7 chronic pain: Much better controlled with  her present medications.  #8 bone metastasis: Patient is receiving Xgev and tolerating it well. She understands risks and benefits of this.a  #9 patient will return in one week's time for next dose of chemotherapy (Taxol cycle 7)  All questions were answered. The patient knows to call the clinic with any problems, questions or concerns. We can certainly see the patient much sooner if necessary.  I spent 20 minutes counseling the patient face to face. The total time spent in the appointment was 30 minutes.    Marcy Panning, MD Medical/Oncology [facility] [phone] (beeper) [phone] (Office)

## 2013-12-27 NOTE — Progress Notes (Signed)
OFFICE PROGRESS NOTE  CC  Gina Kroner, MD [place], [address] [place] [address] Dr. Delila [name] Dr.Daniel [name]  DIAGNOSIS: 68 year old female with with stage IV breast cancer with bone marrow  PRIOR THERAPY:  #1 patient presented with a mass in her right breast in October 2007. She underwent bilateral mammograms. The right breast ultrasound performed on 08/19/2006 showed increased density and distortion in the subareolar location of the right breast. Ultrasound showed abnormal echoes in the subareolar location which was a change measuring 1.5 cm. Patient underwent an ultrasound-guided biopsy on 08/23/2006 which showed invasive mammary carcinoma with lobular features. ER +100% PR 3% proliferation marker Ki-67 12% HER-2/neu 2+ by fish. MRI of the breasts on 08/30/2006 showed an ill-defined enhancing mass in the right breast measuring 2.6 x 4.0 x 4.1 cm. Enhancement trailed out to the nipple but did not involve the nipple. There was also a small enhancing internal mammary node measuring 7 mm suspicious for metastatic disease. Left breast was negative.  #2 patient was seen by Dr. Eston [name] on 09/09/2006. She received neoadjuvant chemotherapy initially consisting of FEC regimen given dose dense for 4 cycles followed by Taxotere. She received her chemotherapy she received her chemotherapy from 09/20/2006 through February 2008. She then went on to lumpectomy on 02/07/2007. However there was essentially residual tumor dispersed over an area 1.8 cm with some close surgical margins at the medial area. Her case was reviewed at the breast conference and it was suggested she undergo mastectomy. Patient underwent a mastectomy on 03/19/2007.  #3 she was subsequently seen by radiation oncology for consideration of post mastectomy radiation therapy. However, she was not given radiation.  #4 she was then begun on Femara 2.5 mg daily. She has now completed 5 years of therapy. It was  discontinued in June 2014.    #5 Returned to clinic on 08/05/13 for evaluation and management of new anemia.  She has underwent PET/CT chest abdomen and pelvis which reveals diffuse lytic lesions throughout the axial and appendicular skeleton.  Bone marrow biopsy was consistent with metastatic ER/PR positive breast cancer.    #6 status post Arimidex  Unfortunately she had progressive disease. Patient sought second opinion at [facility] [place] in [place]. They have recommended single agent Taxol.  #7 Bone mets: Xgeva monthly  #8 single agent palliative taxol 80 mg/m2  CURRENT THERAPY: palliative cycle 6 Taxol weekly/Xgeva monthly  INTERVAL HISTORY: Gina [name] 68 y.o. female returns for follow up prior to chemotherapy. She has been receiving weekly Taxol as well as xgeva.  thus far she is tolerating well. 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 This is an ongoing concern. But she does not having any bleeding.  today she is accompanied by her family members. She is denying having any nausea vomiting fevers or chills. Her appetite is improving. She is in good spirits. She has no aches or pains her pains are well controlled with her present pain medications. No bleeding [name] is negative.   MEDICAL HISTORY: Past Medical History  Diagnosis Date  . Breast cancer 04/28/2013  . Myalgia and myositis, unspecified   . Anemia     ALLERGIES:  is allergic to sudafed.  MEDICATIONS:  Current Outpatient Prescriptions  Medication Sig Dispense Refill  . cetirizine (ZYRTEC) 10 MG tablet Take 10 mg by mouth daily.        . Cholecalciferol (VITAMIN D3) 5000 UNITS TABS Take 5,000 mg by mouth daily.      Marland Kitchen  dronabinol (MARINOL) 2.5 MG capsule Take 1 capsule (2.5 mg total) by mouth 2 (two) times daily before a meal.  60 capsule  1  . lidocaine-prilocaine (EMLA) cream Apply 1 application topically as needed.  30 g  1  . [place] Mushroom POWD 5 mLs by Does not apply route  daily.      . meclizine (ANTIVERT) 25 MG tablet Take 1 tablet (25 mg total) by mouth 3 (three) times daily as needed for dizziness.  30 tablet  0  . mirtazapine (REMERON) 15 MG tablet       . Multiple Vitamins-Minerals (OSTEO COMPLEX PO) Take by mouth.      Marland Kitchen omeprazole (PRILOSEC) 40 MG capsule Take 40 mg by mouth daily.      . ondansetron (ZOFRAN) 4 MG tablet Take 1 tablet (4 mg total) by mouth every 6 (six) hours as needed for nausea.  20 tablet  0  . traMADol (ULTRAM) 50 MG tablet Take 0.5-1 tablets (25-50 mg total) by mouth every 6 (six) hours as needed for pain.  120 tablet  0  . venlafaxine XR (EFFEXOR-XR) 37.5 MG 24 hr capsule Take 1 capsule (37.5 mg total) by mouth daily with breakfast.  30 capsule  9  . HYDROcodone-acetaminophen (NORCO/VICODIN) 5-325 MG per tablet Take 1 tablet by mouth every 6 (six) hours as needed for moderate pain.  30 tablet  0   No current facility-administered medications for this visit.    SURGICAL HISTORY:  Past Surgical History  Procedure Laterality Date  . Mastectomy Right   . Tubal ligation  1971    REVIEW OF SYSTEMS:  A 10 point review of systems was conducted and is otherwise negative except for what is noted above.    PHYSICAL EXAMINATION: Blood pressure 127/77, pulse 135, temperature 98.1 F (36.7 C), temperature source Oral, resp. rate 20, height 5' 3"  (1.6 m), weight 99 lb 4.8 oz (45.042 kg). Body mass index is 17.59 kg/(m^2). General: Patient is a well appearing female, in wheelchair, improved from last exam, weight improved by 4 lbs HEENT: PERRLA, sclerae anicteric, conjunctival pallor, MMM Neck: supple, no palpable adenopathy Lungs: clear to auscultation bilaterally, no wheezes, rhonchi, or rales Cardiovascular: regular rate rhythm, S1, S2, no murmurs, rubs or gallops Abdomen: Soft, non-tender, non-distended, normoactive bowel sounds, no HSM Extremities: warm and well perfused, no clubbing, cyanosis, or edema Skin:  No rashes or  lesions Neuro: Non-focal Breasts: deferred ECOG PERFORMANCE STATUS: 0 - Asymptomatic  LABORATORY DATA: Lab Results  Component Value Date   WBC 5.9 12/25/2013   HGB 10.4* 12/25/2013   HCT 33.1* 12/25/2013   MCV 91.2 12/25/2013   PLT 21* 12/25/2013      Chemistry      Component Value Date/Time   NA 137 12/18/2013 0747   NA 140 11/04/2013 0454   K 4.4 12/18/2013 0747   K 3.8 11/04/2013 0454   CL 108 11/04/2013 0454   CL 105 04/17/2013 1323   CO2 24 12/18/2013 0747   CO2 23 11/04/2013 0454   BUN 16.5 12/18/2013 0747   BUN 8 11/04/2013 0454   CREATININE 0.6 12/18/2013 0747   CREATININE 0.43* 11/04/2013 0454      Component Value Date/Time   CALCIUM 8.8 12/18/2013 0747   CALCIUM 7.3* 11/04/2013 0454   ALKPHOS 235* 12/18/2013 0747   ALKPHOS 224* 11/03/2013 0548   AST 30 12/18/2013 0747   AST 31 11/03/2013 0548   ALT 20 12/18/2013 0747   ALT 13 11/03/2013 0548  BILITOT 0.50 12/18/2013 0747   BILITOT 0.4 11/03/2013 0548    REPORT OF SURGICAL PATHOLOGY  Case #: [id number] Patient Name: BELLANIE, MATTHEW. Office Chart Number: N/A  MRN: [id number] Pathologist: Violet Baldy, MD DOB/Age 06/13/1946 (Age: 30) Gender: F Date Taken: 02/07/2007 Date Received: 02/07/2007  FINAL DIAGNOSIS  MICROSCOPIC EXAMINATION AND DIAGNOSIS  1. RIGHT AXILLARY SENTINEL LYMPH NODE, EXCISION: - METASTATIC CARCINOMA CONSISTENT WITH BREAST PRIMARY, SEE COMMENT.  2. RIGHT BREAST, NEEDLE LOCALIZATION BIOPSY: - RESIDUAL CARCINOMA IN TUMOR REGRESSION AREA. - CARCINOMA IS CLOSE TO MEDIAL AND LATERAL SURGICAL MARGINS OF RESECTION. - SEE ONCOLOGY TABLE  COMMENT 1. The sections show metastatic carcinoma on H The tumor is seen in the form of dispersed atypical epithelioid cells in a fibrotic background involving an area more than 0.2 cm consistent with metastatic carcinoma. No extranodal extension is identified. For confirmation, Cytokeratin stain (AE1/AE3) was performed and is positive. The control stained  appropriately.  ONCOLOGY TABLE-BREAST, INCISIONAL/EXCISIONAL BIOPSY OR MASTECTOMY WITH LYMPH NODES  1. Maximum tumor size (cm): Dispersed tumor cells in a tumor regression area measuring 1.8 cm, see comment 2. Margins: Tumor cells are seen less than 0.1 cm from the surgical margins of resection Invasive component, distance to closest margin: Less than 0.1 cm In situ component, distance to closest margin: N/A 3. Vascular/Lymphatic invasion: Yes 4. Histology, invasive component: Invasive mammary carcinoma consistent with lobular carcinoma 5. Grade, invasive component (Elston-Ellis modified Scarff-Bloom-Richardson): II Tubule formation grade: 3 Nuclear pleomorphism grade: 2 Mitotic grade: 1 6. Extensive intraductal component (JCRT): No 7. Histology, in situ component: N/A 8. Grade, in situ component: N/A 9. Multicentric (separate tumors in different quadrants): Unknown 10. Multifocal (separate tumors in same quadrant or biopsy): No 11. Axillary lymph nodes: #examined: 1 #with metastasis: 1 ITC (isolated tumor cells, < 0.75m): 0 Micrometastasis (> 0.230m < 48m49m 0 Metastasis > 2 mm: 1 Extracapsular extension: No 12. TNM Code: ypT1, pN1a, pMX 13. Breast Prognostic Markers: Case number [id number] receptor positive (100%) Progesterone receptor positive (3%) Ki 67 (Mib-1) 12% Her 2 neu (HercepTest) 2+ Her 2 neu by FISH N/A 14. Non-neoplastic breast: Fibrosis 15. Comments: Sections of the localized tissue show a fibrotic area measuring 1.8 cm and displaying dispersed atypical epithelioid cells throughout that area. Some of the cells surround the benign appearing ductal elements. The findings are subtle and most compatible with residual carcinoma. For confirmation cytokeratin stains were performed on several blocks representing the localized tissue and show strong positivity in previously described epithelioid cells confirming the morphologic impression of residual  carcinoma. The carcinoma is less than 0.1 cm from the medial surgical margin of resection and approximately 0.1 cm from the lateral surgical margin of resection. Since the findings are subtle and the tumor is essentially composed of dispersed cells throughout, the status of the medial and lateral margins in particular may be questionable and hence truly negative margins cannot be assured. I strongly recommend clinical correlation and followup. (BNS:caf 02/12/07)  RADIOGRAPHIC STUDIES:  No results found.  ASSESSMENT/PLAN: [age]ar old female with  #1 history of stage II invasive mammary carcinoma with lobular features originally diagnosed in 2007. She underwent neoadjuvant chemotherapy consisting of 4 cycles of dose dense FEC followed by dose dense Taxotere for 4 cycles. She then had a lumpectomy that revealed residual disease. She proceeded on to have a mastectomy. She was not a candidate for radiation therapy even though she did have an evaluation by radiation oncology.  #2 Dr. RubTruddie Cocoaced her on Femara 2.5 mg  daily she overall tolerated it well. She completed 5 years of Femara. She discontinued this on 04/28/2013.  #3 Anemia work up started on 08/05/13.  After CT chest abd pelvis, PET scan, and bone marrow biopsy, patient was found to have metastatic breast cancer to the bone.  She will receive Xgeva every four weeks and take Arimidex daily.    #4 Stage IV: Taxol 80 mg per meter squared q. Weekly 3 weeks on one week off  #5 malnutrition: continue aggressive po intake with high calorie and protein foods  #6Thrombocytopenia:secondary to widely spread breast cancer  #7 Bone Mets: Xgeva monthly   All questions were answered. The patient knows to call the clinic with any problems, questions or concerns. We can certainly see the patient much sooner if necessary.  I spent 20 minutes counseling the patient face to face. The total time spent in the appointment was 30 minutes.    Marcy Panning, MD Medical/Oncology [facility] [phone] (beeper) [phone] (Office)

## 2013-12-31 ENCOUNTER — Other Ambulatory Visit: Payer: Self-pay | Admitting: *Deleted

## 2013-12-31 DIAGNOSIS — C50919 Malignant neoplasm of unspecified site of unspecified female breast: Secondary | ICD-10-CM

## 2014-01-01 ENCOUNTER — Ambulatory Visit: Payer: 59

## 2014-01-01 ENCOUNTER — Ambulatory Visit (HOSPITAL_BASED_OUTPATIENT_CLINIC_OR_DEPARTMENT_OTHER): Payer: 59

## 2014-01-01 ENCOUNTER — Encounter: Payer: Self-pay | Admitting: Oncology

## 2014-01-01 ENCOUNTER — Telehealth: Payer: Self-pay | Admitting: Oncology

## 2014-01-01 ENCOUNTER — Ambulatory Visit (HOSPITAL_BASED_OUTPATIENT_CLINIC_OR_DEPARTMENT_OTHER): Payer: 59 | Admitting: Oncology

## 2014-01-01 ENCOUNTER — Telehealth: Payer: Self-pay | Admitting: *Deleted

## 2014-01-01 ENCOUNTER — Other Ambulatory Visit (HOSPITAL_BASED_OUTPATIENT_CLINIC_OR_DEPARTMENT_OTHER): Payer: 59

## 2014-01-01 VITALS — BP 119/75 | HR 132 | Temp 98.3°F | Resp 18 | Ht 63.0 in | Wt 102.6 lb

## 2014-01-01 DIAGNOSIS — C7951 Secondary malignant neoplasm of bone: Secondary | ICD-10-CM

## 2014-01-01 DIAGNOSIS — C7952 Secondary malignant neoplasm of bone marrow: Secondary | ICD-10-CM

## 2014-01-01 DIAGNOSIS — C50119 Malignant neoplasm of central portion of unspecified female breast: Secondary | ICD-10-CM

## 2014-01-01 DIAGNOSIS — C50919 Malignant neoplasm of unspecified site of unspecified female breast: Secondary | ICD-10-CM

## 2014-01-01 DIAGNOSIS — D696 Thrombocytopenia, unspecified: Secondary | ICD-10-CM

## 2014-01-01 DIAGNOSIS — Z5111 Encounter for antineoplastic chemotherapy: Secondary | ICD-10-CM

## 2014-01-01 DIAGNOSIS — G8929 Other chronic pain: Secondary | ICD-10-CM

## 2014-01-01 DIAGNOSIS — E46 Unspecified protein-calorie malnutrition: Secondary | ICD-10-CM

## 2014-01-01 DIAGNOSIS — D649 Anemia, unspecified: Secondary | ICD-10-CM

## 2014-01-01 LAB — CBC WITH DIFFERENTIAL/PLATELET
HCT: 32 % — ABNORMAL LOW (ref 34.8–46.6)
HGB: 9.7 g/dL — ABNORMAL LOW (ref 11.6–15.9)
MCH: 28.3 pg (ref 25.1–34.0)
MCHC: 30.3 g/dL — ABNORMAL LOW (ref 31.5–36.0)
MCV: 93.3 fL (ref 79.5–101.0)
Platelets: 35 10*3/uL — ABNORMAL LOW (ref 145–400)
RBC: 3.43 10*6/uL — AB (ref 3.70–5.45)
RDW: 18.6 % — AB (ref 11.2–14.5)
WBC: 6.1 10*3/uL (ref 3.9–10.3)

## 2014-01-01 LAB — MANUAL DIFFERENTIAL
ALC: 0.9 10*3/uL (ref 0.9–3.3)
ANC (CHCC manual diff): 3.1 10*3/uL (ref 1.5–6.5)
BAND NEUTROPHILS: 15 % — AB (ref 0–10)
BLASTS: 3 % — AB (ref 0–0)
Basophil: 6 % — ABNORMAL HIGH (ref 0–2)
EOS%: 7 % (ref 0–7)
LYMPH: 15 % (ref 14–49)
MONO: 19 % — ABNORMAL HIGH (ref 0–14)
Metamyelocytes: 5 % — ABNORMAL HIGH (ref 0–0)
Myelocytes: 16 % — ABNORMAL HIGH (ref 0–0)
NRBC: 20 % — AB (ref 0–0)
OTHER CELL: 0 % (ref 0–0)
PLT EST: DECREASED
PROMYELO: 0 % (ref 0–0)
SEG: 14 % — AB (ref 38–77)
Variant Lymph: 0 % (ref 0–0)

## 2014-01-01 LAB — COMPREHENSIVE METABOLIC PANEL (CC13)
ALK PHOS: 169 U/L — AB (ref 40–150)
ALT: 15 U/L (ref 0–55)
AST: 28 U/L (ref 5–34)
Albumin: 3.3 g/dL — ABNORMAL LOW (ref 3.5–5.0)
Anion Gap: 11 mEq/L (ref 3–11)
BILIRUBIN TOTAL: 0.54 mg/dL (ref 0.20–1.20)
BUN: 12.4 mg/dL (ref 7.0–26.0)
CO2: 25 mEq/L (ref 22–29)
Calcium: 8.7 mg/dL (ref 8.4–10.4)
Chloride: 103 mEq/L (ref 98–109)
Creatinine: 0.6 mg/dL (ref 0.6–1.1)
Glucose: 130 mg/dl (ref 70–140)
Potassium: 4.1 mEq/L (ref 3.5–5.1)
SODIUM: 140 meq/L (ref 136–145)
TOTAL PROTEIN: 6.8 g/dL (ref 6.4–8.3)

## 2014-01-01 MED ORDER — ONDANSETRON 8 MG/NS 50 ML IVPB
INTRAVENOUS | Status: AC
  Filled 2014-01-01: qty 8

## 2014-01-01 MED ORDER — DIPHENHYDRAMINE HCL 50 MG/ML IJ SOLN
INTRAMUSCULAR | Status: AC
  Filled 2014-01-01: qty 1

## 2014-01-01 MED ORDER — FAMOTIDINE IN NACL 20-0.9 MG/50ML-% IV SOLN
INTRAVENOUS | Status: AC
Start: 2014-01-01 — End: 2014-01-01
  Filled 2014-01-01: qty 50

## 2014-01-01 MED ORDER — DENOSUMAB 120 MG/1.7ML ~~LOC~~ SOLN
120.0000 mg | Freq: Once | SUBCUTANEOUS | Status: AC
  Administered 2014-01-01: 120 mg via SUBCUTANEOUS
  Filled 2014-01-01: qty 1.7

## 2014-01-01 MED ORDER — DEXAMETHASONE SODIUM PHOSPHATE 20 MG/5ML IJ SOLN
20.0000 mg | Freq: Once | INTRAMUSCULAR | Status: AC
  Administered 2014-01-01: 20 mg via INTRAVENOUS

## 2014-01-01 MED ORDER — SODIUM CHLORIDE 0.9 % IV SOLN
60.0000 mg/m2 | Freq: Once | INTRAVENOUS | Status: AC
  Administered 2014-01-01: 84 mg via INTRAVENOUS
  Filled 2014-01-01: qty 14

## 2014-01-01 MED ORDER — SODIUM CHLORIDE 0.9 % IV SOLN
Freq: Once | INTRAVENOUS | Status: AC
  Administered 2014-01-01: 10:00:00 via INTRAVENOUS

## 2014-01-01 MED ORDER — DEXAMETHASONE SODIUM PHOSPHATE 20 MG/5ML IJ SOLN
INTRAMUSCULAR | Status: AC
  Filled 2014-01-01: qty 5

## 2014-01-01 MED ORDER — FAMOTIDINE IN NACL 20-0.9 MG/50ML-% IV SOLN
20.0000 mg | Freq: Once | INTRAVENOUS | Status: AC
  Administered 2014-01-01: 20 mg via INTRAVENOUS

## 2014-01-01 MED ORDER — SODIUM CHLORIDE 0.9 % IJ SOLN
10.0000 mL | INTRAMUSCULAR | Status: DC | PRN
  Administered 2014-01-01: 10 mL
  Filled 2014-01-01: qty 10

## 2014-01-01 MED ORDER — ONDANSETRON 8 MG/50ML IVPB (CHCC)
8.0000 mg | Freq: Once | INTRAVENOUS | Status: AC
  Administered 2014-01-01: 8 mg via INTRAVENOUS

## 2014-01-01 MED ORDER — HEPARIN SOD (PORK) LOCK FLUSH 100 UNIT/ML IV SOLN
500.0000 [IU] | Freq: Once | INTRAVENOUS | Status: AC | PRN
  Administered 2014-01-01: 500 [IU]
  Filled 2014-01-01: qty 5

## 2014-01-01 MED ORDER — DIPHENHYDRAMINE HCL 50 MG/ML IJ SOLN
25.0000 mg | Freq: Once | INTRAMUSCULAR | Status: AC
  Administered 2014-01-01: 25 mg via INTRAVENOUS

## 2014-01-01 NOTE — Patient Instructions (Addendum)
[facility] Discharge Instructions for Patients Receiving Chemotherapy  Today you received the following chemotherapy agent Taxol as well as your Xgeva injection.  To help prevent nausea and vomiting after your treatment, we encourage you to take your nausea medication.   If you develop nausea and vomiting that is not controlled by your nausea medication, call the clinic.   BELOW ARE SYMPTOMS THAT SHOULD BE REPORTED IMMEDIATELY:  *FEVER GREATER THAN 100.5 F  *CHILLS WITH OR WITHOUT FEVER  NAUSEA AND VOMITING THAT IS NOT CONTROLLED WITH YOUR NAUSEA MEDICATION  *UNUSUAL SHORTNESS OF BREATH  *UNUSUAL BRUISING OR BLEEDING  TENDERNESS IN MOUTH AND THROAT WITH OR WITHOUT PRESENCE OF ULCERS  *URINARY PROBLEMS  *BOWEL PROBLEMS  UNUSUAL RASH Items with * indicate a potential emergency and should be followed up as soon as possible.  Feel free to call the clinic you have any questions or concerns. The clinic phone number is [phone].

## 2014-01-01 NOTE — Telephone Encounter (Signed)
Per staff message and POF I have scheduled appts.  JMW  

## 2014-01-01 NOTE — Telephone Encounter (Signed)
, °

## 2014-01-01 NOTE — Progress Notes (Signed)
Per MD, ok to treat today with current lab levels.

## 2014-01-01 NOTE — Progress Notes (Signed)
OFFICE PROGRESS NOTE  CC  [name], MD [address], [address] [place] [address] Dr. 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 Diagnosed in October 2007  PRIOR THERAPY:  #1 patient presented with a mass in her right breast in October 2007. She underwent bilateral mammograms. The right breast ultrasound performed on 08/19/2006 showed increased density and distortion in the subareolar location of the right breast. Ultrasound showed abnormal echoes in the subareolar location which was a change measuring 1.5 cm. Patient underwent an ultrasound-guided biopsy on 08/23/2006 which showed invasive mammary carcinoma with lobular features. ER +100% PR 3% proliferation marker Ki-67 12% HER-2/neu 2+ by fish. MRI of the breasts on 08/30/2006 showed an ill-defined enhancing mass in the right breast measuring 2.6 x 4.0 x 4.1 cm. Enhancement trailed out to the nipple but did not involve the nipple. There was also a small enhancing internal mammary node measuring 7 mm suspicious for metastatic disease. Left breast was negative.  #2 patient was seen by Dr. Rich Reining on 09/09/2006. She received neoadjuvant chemotherapy initially consisting of FEC regimen given dose dense for 4 cycles followed by Taxotere. She received her chemotherapy she received her chemotherapy from 09/20/2006 through February 2008. She then went on to lumpectomy on 02/07/2007. However there was essentially residual tumor dispersed over an area 1.8 cm with some close surgical margins at the medial area. Her case was reviewed at the breast conference and it was suggested she undergo mastectomy. Patient underwent a mastectomy on 03/19/2007.  #3 she was subsequently seen by radiation oncology for consideration of post mastectomy radiation therapy. However, she was not given radiation.  #4 she was then begun on Femara 2.5  mg daily. She has now completed 5 years of therapy. It was discontinued in June 2014.    #5 Returned to clinic on 08/05/13 for evaluation and management of new anemia.  She has underwent PET/CT chest abdomen and pelvis which reveals diffuse lytic lesions throughout the axial and appendicular skeleton.  Bone marrow biopsy was consistent with metastatic ER/PR positive breast cancer.    #6 status post Arimidex  Unfortunately she had progressive disease. Patient sought second opinion at [facility] [place] in [place]. They have recommended single agent Taxol.  CURRENT THERAPY: palliative  Taxol weekly/Xgeva monthly  INTERVAL HISTORY: KEVYN WENGERT 68 y.o. female returns for follow up prior to chemotherapy. she will receive weekly Taxol as well as xgeva.  thus far she is tolerating well. 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 This is an ongoing concern. But she does not having any bleeding. On her last visit she did have dose attenuation.  today she is accompanied by her family members. She is denying having any nausea vomiting fevers or chills. Her appetite does improving. She is in good spirits. She has no aches or pains her pains are well controlled with her present pain medications. She looks perfect. However her platelets have gone down further to 21,000. I have recommended not giving him chemotherapy today. She has no bleeding problems. Remainder of the review of systems is negative.   MEDICAL HISTORY: Past Medical History  Diagnosis Date  . Breast cancer 04/28/2013  . Myalgia and myositis, unspecified   . Anemia     ALLERGIES:  is allergic to sudafed.  MEDICATIONS:  Current Outpatient Prescriptions  Medication Sig Dispense Refill  . cetirizine (ZYRTEC) 10 MG tablet Take 10 mg by mouth daily.        Marland Kitchen  Cholecalciferol (VITAMIN D3) 5000 UNITS TABS Take 5,000 mg by mouth daily.      Marland Kitchen dronabinol (MARINOL) 2.5 MG capsule Take 1 capsule (2.5 mg total) by mouth 2 (two) times daily before a meal.   60 capsule  1  . HYDROcodone-acetaminophen (NORCO/VICODIN) 5-325 MG per tablet Take 1 tablet by mouth every 6 (six) hours as needed for moderate pain.  30 tablet  0  . lidocaine-prilocaine (EMLA) cream Apply 1 application topically as needed.  30 g  1  . [place] Mushroom POWD 5 mLs by Does not apply route daily.      . meclizine (ANTIVERT) 25 MG tablet Take 1 tablet (25 mg total) by mouth 3 (three) times daily as needed for dizziness.  30 tablet  0  . mirtazapine (REMERON) 15 MG tablet       . Multiple Vitamins-Minerals (OSTEO COMPLEX PO) Take by mouth.      Marland Kitchen omeprazole (PRILOSEC) 40 MG capsule Take 40 mg by mouth daily.      . ondansetron (ZOFRAN) 4 MG tablet Take 1 tablet (4 mg total) by mouth every 6 (six) hours as needed for nausea.  20 tablet  0  . traMADol (ULTRAM) 50 MG tablet Take 0.5-1 tablets (25-50 mg total) by mouth every 6 (six) hours as needed for pain.  120 tablet  0  . venlafaxine XR (EFFEXOR-XR) 37.5 MG 24 hr capsule Take 1 capsule (37.5 mg total) by mouth daily with breakfast.  30 capsule  9   No current facility-administered medications for this visit.    SURGICAL HISTORY:  Past Surgical History  Procedure Laterality Date  . Mastectomy Right   . Tubal ligation  1971    REVIEW OF SYSTEMS:  A 10 point review of systems was conducted and is otherwise negative except for what is noted above.    PHYSICAL EXAMINATION: Blood pressure 119/75, pulse 132, temperature 98.3 F (36.8 C), temperature source Oral, resp. rate 18, height 5' 3"  (1.6 m), weight 102 lb 9.6 oz (46.539 kg). Body mass index is 18.18 kg/(m^2). General: Patient is a well appearing female, in wheelchair, improved from last exam, weight improved by 4 lbs HEENT: PERRLA, sclerae anicteric, conjunctival pallor, MMM Neck: supple, no palpable adenopathy Lungs: clear to auscultation bilaterally, no wheezes, rhonchi, or rales Cardiovascular: regular rate rhythm, S1, S2, no murmurs, rubs or gallops Abdomen: Soft,  non-tender, non-distended, normoactive bowel sounds, no HSM Extremities: warm and well perfused, no clubbing, cyanosis, or edema Skin:  No rashes or lesions Neuro: Non-focal Breasts: deferred ECOG PERFORMANCE STATUS: 0 - Asymptomatic  LABORATORY DATA: Lab Results  Component Value Date   WBC 6.1 01/01/2014   HGB 9.7* 01/01/2014   HCT 32.0* 01/01/2014   MCV 93.3 01/01/2014   PLT 35* 01/01/2014      Chemistry      Component Value Date/Time   NA 140 01/01/2014 0749   NA 140 11/04/2013 0454   K 4.1 01/01/2014 0749   K 3.8 11/04/2013 0454   CL 108 11/04/2013 0454   CL 105 04/17/2013 1323   CO2 25 01/01/2014 0749   CO2 23 11/04/2013 0454   BUN 12.4 01/01/2014 0749   BUN 8 11/04/2013 0454   CREATININE 0.6 01/01/2014 0749   CREATININE 0.43* 11/04/2013 0454      Component Value Date/Time   CALCIUM 8.7 01/01/2014 0749   CALCIUM 7.3* 11/04/2013 0454   ALKPHOS 169* 01/01/2014 0749   ALKPHOS 224* 11/03/2013 0548   AST 28 01/01/2014 0749  AST 31 11/03/2013 0548   ALT 15 01/01/2014 0749   ALT 13 11/03/2013 0548   BILITOT 0.54 01/01/2014 0749   BILITOT 0.4 11/03/2013 0548    REPORT OF SURGICAL PATHOLOGY  Case #: [id number] Patient Name: KIMBERLA, DRISKILL. Office Chart Number: N/A  MRN: [id number] Pathologist: Violet Baldy, MD DOB/Age [birth year] (Age: 14) Gender: F Date Taken: 02/07/2007 Date Received: 02/07/2007  FINAL DIAGNOSIS  MICROSCOPIC EXAMINATION AND DIAGNOSIS  1. RIGHT AXILLARY SENTINEL LYMPH NODE, EXCISION: - METASTATIC CARCINOMA CONSISTENT WITH BREAST PRIMARY, SEE COMMENT.  2. RIGHT BREAST, NEEDLE LOCALIZATION BIOPSY: - RESIDUAL CARCINOMA IN TUMOR REGRESSION AREA. - CARCINOMA IS CLOSE TO MEDIAL AND LATERAL SURGICAL MARGINS OF RESECTION. - SEE ONCOLOGY TABLE  COMMENT 1. The sections show metastatic carcinoma on H The tumor is seen in the form of dispersed atypical epithelioid cells in a fibrotic background involving an area more than 0.2 cm consistent with metastatic carcinoma. No extranodal  extension is identified. For confirmation, Cytokeratin stain (AE1/AE3) was performed and is positive. The control stained appropriately.  ONCOLOGY TABLE-BREAST, INCISIONAL/EXCISIONAL BIOPSY OR MASTECTOMY WITH LYMPH NODES  1. Maximum tumor size (cm): Dispersed tumor cells in a tumor regression area measuring 1.8 cm, see comment 2. Margins: Tumor cells are seen less than 0.1 cm from the surgical margins of resection Invasive component, distance to closest margin: Less than 0.1 cm In situ component, distance to closest margin: N/A 3. Vascular/Lymphatic invasion: Yes 4. Histology, invasive component: Invasive mammary carcinoma consistent with lobular carcinoma 5. Grade, invasive component (Elston-Ellis modified Scarff-Bloom-Richardson): II Tubule formation grade: 3 Nuclear pleomorphism grade: 2 Mitotic grade: 1 6. Extensive intraductal component (JCRT): No 7. Histology, in situ component: N/A 8. Grade, in situ component: N/A 9. Multicentric (separate tumors in different quadrants): Unknown 10. Multifocal (separate tumors in same quadrant or biopsy): No 11. Axillary lymph nodes: #examined: 1 #with metastasis: 1 ITC (isolated tumor cells, < 0.67m): 0 Micrometastasis (> 0.221m < 25m625m 0 Metastasis > 2 mm: 1 Extracapsular extension: No 12. TNM Code: ypT1, pN1a, pMX 13. Breast Prognostic Markers: Case number [id number] receptor positive (100%) Progesterone receptor positive (3%) Ki 67 (Mib-1) 12% Her 2 neu (HercepTest) 2+ Her 2 neu by FISH N/A 14. Non-neoplastic breast: Fibrosis 15. Comments: Sections of the localized tissue show a fibrotic area measuring 1.8 cm and displaying dispersed atypical epithelioid cells throughout that area. Some of the cells surround the benign appearing ductal elements. The findings are subtle and most compatible with residual carcinoma. For confirmation cytokeratin stains were performed on several blocks representing the localized tissue  and show strong positivity in previously described epithelioid cells confirming the morphologic impression of residual carcinoma. The carcinoma is less than 0.1 cm from the medial surgical margin of resection and approximately 0.1 cm from the lateral surgical margin of resection. Since the findings are subtle and the tumor is essentially composed of dispersed cells throughout, the status of the medial and lateral margins in particular may be questionable and hence truly negative margins cannot be assured. I strongly recommend clinical correlation and followup. (BNS:caf 02/12/07)  RADIOGRAPHIC STUDIES:  No results found.  ASSESSMENTPLAN: [age]ar old female with  #1 history of stage II invasive mammary carcinoma with lobular features originally diagnosed in 2007. She underwent neoadjuvant chemotherapy consisting of 4 cycles of dose dense FEC followed by dose dense Taxotere for 4 cycles. She then had a lumpectomy that revealed residual disease. She proceeded on to have a mastectomy. She was not a candidate for radiation therapy even though  she did have an evaluation by radiation oncology.  #2 Dr. Truddie Coco placed her on Femara 2.5 mg daily she overall tolerated it well. She completed 5 years of Femara. She discontinued this on 04/28/2013.  #3 Anemia work up started on 08/05/13.  After CT chest abd pelvis, PET scan, and bone marrow biopsy, patient was found to have metastatic breast cancer to the bone.  She will receive Xgeva every four weeks and take Arimidex daily.    #4 palliative chemotherapy: Patient is currently on Taxol weekly. I did dose attenuate her to 60 mg per meter squared. She will continue to do on a weekly basis. Her platelets are 35,000 patient is aware. Her white count is normal.  #5 malnutrition: Patient has gained 3 pounds which is significantly better. She is eating.  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 Today  platelets are only 21,000. We will hold her chemotherapy.  #7 chronic pain: Much better controlled with her present medications.  #8 bone metastasis: Patient is receiving Xgev and tolerating it well. She understands risks and benefits of this. This is being given on a monthly basis.  #9 patient will return in one week's time for next dose of chemotherapy   All questions were answered. The patient knows to call the clinic with any problems, questions or concerns. We can certainly see the patient much sooner if necessary.  I spent 20 minutes counseling the patient face to face. The total time spent in the appointment was 30 minutes.    Marcy Panning, MD Medical/Oncology [facility] [phone] (beeper) [phone] (Office)

## 2014-01-07 ENCOUNTER — Other Ambulatory Visit: Payer: Self-pay | Admitting: *Deleted

## 2014-01-07 DIAGNOSIS — C50919 Malignant neoplasm of unspecified site of unspecified female breast: Secondary | ICD-10-CM

## 2014-01-08 ENCOUNTER — Ambulatory Visit (HOSPITAL_BASED_OUTPATIENT_CLINIC_OR_DEPARTMENT_OTHER): Payer: 59

## 2014-01-08 ENCOUNTER — Other Ambulatory Visit (HOSPITAL_BASED_OUTPATIENT_CLINIC_OR_DEPARTMENT_OTHER): Payer: 59

## 2014-01-08 ENCOUNTER — Encounter: Payer: Self-pay | Admitting: Specialist

## 2014-01-08 ENCOUNTER — Ambulatory Visit (HOSPITAL_BASED_OUTPATIENT_CLINIC_OR_DEPARTMENT_OTHER): Payer: 59 | Admitting: Oncology

## 2014-01-08 ENCOUNTER — Encounter: Payer: Self-pay | Admitting: Oncology

## 2014-01-08 VITALS — BP 127/86 | HR 130 | Temp 97.8°F | Resp 20 | Ht 63.0 in | Wt 102.7 lb

## 2014-01-08 DIAGNOSIS — C7951 Secondary malignant neoplasm of bone: Secondary | ICD-10-CM

## 2014-01-08 DIAGNOSIS — C50919 Malignant neoplasm of unspecified site of unspecified female breast: Secondary | ICD-10-CM

## 2014-01-08 DIAGNOSIS — C50119 Malignant neoplasm of central portion of unspecified female breast: Secondary | ICD-10-CM

## 2014-01-08 DIAGNOSIS — D696 Thrombocytopenia, unspecified: Secondary | ICD-10-CM

## 2014-01-08 DIAGNOSIS — D6959 Other secondary thrombocytopenia: Secondary | ICD-10-CM

## 2014-01-08 DIAGNOSIS — C7952 Secondary malignant neoplasm of bone marrow: Secondary | ICD-10-CM

## 2014-01-08 DIAGNOSIS — Z5111 Encounter for antineoplastic chemotherapy: Secondary | ICD-10-CM

## 2014-01-08 DIAGNOSIS — D649 Anemia, unspecified: Secondary | ICD-10-CM

## 2014-01-08 LAB — COMPREHENSIVE METABOLIC PANEL (CC13)
ALBUMIN: 3.5 g/dL (ref 3.5–5.0)
ALK PHOS: 157 U/L — AB (ref 40–150)
ALT: 15 U/L (ref 0–55)
ANION GAP: 11 meq/L (ref 3–11)
AST: 30 U/L (ref 5–34)
BUN: 15.9 mg/dL (ref 7.0–26.0)
CALCIUM: 9.1 mg/dL (ref 8.4–10.4)
CHLORIDE: 105 meq/L (ref 98–109)
CO2: 23 mEq/L (ref 22–29)
Creatinine: 0.7 mg/dL (ref 0.6–1.1)
Glucose: 120 mg/dl (ref 70–140)
Potassium: 4.6 mEq/L (ref 3.5–5.1)
Sodium: 139 mEq/L (ref 136–145)
Total Bilirubin: 0.5 mg/dL (ref 0.20–1.20)
Total Protein: 7.2 g/dL (ref 6.4–8.3)

## 2014-01-08 LAB — CBC WITH DIFFERENTIAL/PLATELET
HEMATOCRIT: 33 % — AB (ref 34.8–46.6)
HGB: 10.3 g/dL — ABNORMAL LOW (ref 11.6–15.9)
MCH: 28.4 pg (ref 25.1–34.0)
MCHC: 31.2 g/dL — AB (ref 31.5–36.0)
MCV: 90.9 fL (ref 79.5–101.0)
PLATELETS: 36 10*3/uL — AB (ref 145–400)
RBC: 3.63 10*6/uL — ABNORMAL LOW (ref 3.70–5.45)
RDW: 18.5 % — ABNORMAL HIGH (ref 11.2–14.5)
WBC: 8.2 10*3/uL (ref 3.9–10.3)

## 2014-01-08 LAB — MANUAL DIFFERENTIAL
ALC: 1.9 10*3/uL (ref 0.9–3.3)
ANC (CHCC manual diff): 3.6 10*3/uL (ref 1.5–6.5)
BASOPHIL: 6 % — AB (ref 0–2)
BLASTS: 2 % — AB (ref 0–0)
Band Neutrophils: 9 % (ref 0–10)
EOS: 10 % — ABNORMAL HIGH (ref 0–7)
LYMPH: 23 % (ref 14–49)
METAMYELOCYTES PCT: 8 % — AB (ref 0–0)
MONO: 15 % — ABNORMAL HIGH (ref 0–14)
MYELOCYTES: 12 % — AB (ref 0–0)
Other Cell: 0 % (ref 0–0)
PLT EST: DECREASED
PROMYELO: 0 % (ref 0–0)
SEG: 15 % — ABNORMAL LOW (ref 38–77)
Variant Lymph: 0 % (ref 0–0)
nRBC: 29 % — ABNORMAL HIGH (ref 0–0)

## 2014-01-08 MED ORDER — DEXAMETHASONE SODIUM PHOSPHATE 20 MG/5ML IJ SOLN
20.0000 mg | Freq: Once | INTRAMUSCULAR | Status: AC
  Administered 2014-01-08: 20 mg via INTRAVENOUS

## 2014-01-08 MED ORDER — SODIUM CHLORIDE 0.9 % IV SOLN
60.0000 mg/m2 | Freq: Once | INTRAVENOUS | Status: AC
  Administered 2014-01-08: 84 mg via INTRAVENOUS
  Filled 2014-01-08: qty 14

## 2014-01-08 MED ORDER — DIPHENHYDRAMINE HCL 50 MG/ML IJ SOLN
INTRAMUSCULAR | Status: AC
  Filled 2014-01-08: qty 1

## 2014-01-08 MED ORDER — ONDANSETRON 8 MG/50ML IVPB (CHCC)
8.0000 mg | Freq: Once | INTRAVENOUS | Status: AC
  Administered 2014-01-08: 8 mg via INTRAVENOUS

## 2014-01-08 MED ORDER — ONDANSETRON 8 MG/NS 50 ML IVPB
INTRAVENOUS | Status: AC
  Filled 2014-01-08: qty 8

## 2014-01-08 MED ORDER — SODIUM CHLORIDE 0.9 % IV SOLN
Freq: Once | INTRAVENOUS | Status: AC
  Administered 2014-01-08: 10:00:00 via INTRAVENOUS

## 2014-01-08 MED ORDER — HEPARIN SOD (PORK) LOCK FLUSH 100 UNIT/ML IV SOLN
500.0000 [IU] | Freq: Once | INTRAVENOUS | Status: AC | PRN
  Administered 2014-01-08: 500 [IU]
  Filled 2014-01-08: qty 5

## 2014-01-08 MED ORDER — SODIUM CHLORIDE 0.9 % IJ SOLN
10.0000 mL | INTRAMUSCULAR | Status: DC | PRN
  Administered 2014-01-08: 10 mL
  Filled 2014-01-08: qty 10

## 2014-01-08 MED ORDER — FAMOTIDINE IN NACL 20-0.9 MG/50ML-% IV SOLN
INTRAVENOUS | Status: AC
  Filled 2014-01-08: qty 50

## 2014-01-08 MED ORDER — FAMOTIDINE IN NACL 20-0.9 MG/50ML-% IV SOLN
20.0000 mg | Freq: Once | INTRAVENOUS | Status: AC
  Administered 2014-01-08: 20 mg via INTRAVENOUS

## 2014-01-08 MED ORDER — DEXAMETHASONE SODIUM PHOSPHATE 20 MG/5ML IJ SOLN
INTRAMUSCULAR | Status: AC
  Filled 2014-01-08: qty 5

## 2014-01-08 MED ORDER — DIPHENHYDRAMINE HCL 50 MG/ML IJ SOLN
25.0000 mg | Freq: Once | INTRAMUSCULAR | Status: AC
  Administered 2014-01-08: 25 mg via INTRAVENOUS

## 2014-01-08 NOTE — Progress Notes (Signed)
OFFICE PROGRESS NOTE  CC  [name], MD [address], [address] [place] [address] Dr. 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 Diagnosed in October 2007  PRIOR THERAPY:  #1 patient presented with a mass in her right breast in October 2007. She underwent bilateral mammograms. The right breast ultrasound performed on 08/19/2006 showed increased density and distortion in the subareolar location of the right breast. Ultrasound showed abnormal echoes in the subareolar location which was a change measuring 1.5 cm. Patient underwent an ultrasound-guided biopsy on 08/23/2006 which showed invasive mammary carcinoma with lobular features. ER +100% PR 3% proliferation marker Ki-67 12% HER-2/neu 2+ by fish. MRI of the breasts on 08/30/2006 showed an ill-defined enhancing mass in the right breast measuring 2.6 x 4.0 x 4.1 cm. Enhancement trailed out to the nipple but did not involve the nipple. There was also a small enhancing internal mammary node measuring 7 mm suspicious for metastatic disease. Left breast was negative.  #2 patient was seen by Dr. Rich Reining on 09/09/2006. She received neoadjuvant chemotherapy initially consisting of FEC regimen given dose dense for 4 cycles followed by Taxotere. She received her chemotherapy she received her chemotherapy from 09/20/2006 through February 2008. She then went on to lumpectomy on 02/07/2007. However there was essentially residual tumor dispersed over an area 1.8 cm with some close surgical margins at the medial area. Her case was reviewed at the breast conference and it was suggested she undergo mastectomy. Patient underwent a mastectomy on 03/19/2007.  #3 she was subsequently seen by radiation oncology for consideration of post mastectomy radiation therapy. However, she was not given radiation.  #4 she was then begun on Femara 2.5  mg daily. She has now completed 5 years of therapy. It was discontinued in June 2014.    #5 Returned to clinic on 08/05/13 for evaluation and management of new anemia.  She has underwent PET/CT chest abdomen and pelvis which reveals diffuse lytic lesions throughout the axial and appendicular skeleton.  Bone marrow biopsy was consistent with metastatic ER/PR positive breast cancer.    #6 status post Arimidex    #7Unfortunately she had progressive disease. Patient sought second opinion at [facility] [place] in [place]. They have recommended single agent Taxol.  CURRENT THERAPY: palliative  Taxol weekly dose reduced/Xgeva monthly  INTERVAL HISTORY: Gina [name] 68 y.o. female returns for follow up prior to chemotherapy. thus far she is tolerating well. 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 This is an ongoing concern. But she does not having any bleeding.  She is denying having any nausea vomiting fevers or chills. Her appetite does improving. She is in good spirits. She has no aches or pains her pains are well controlled with her present pain medications. She looks perfect. She has no bleeding problems. Remainder of the review of systems is negative.   MEDICAL HISTORY: Past Medical History  Diagnosis Date  . Breast cancer 04/28/2013  . Myalgia and myositis, unspecified   . Anemia     ALLERGIES:  is allergic to sudafed.  MEDICATIONS:  Current Outpatient Prescriptions  Medication Sig Dispense Refill  . cetirizine (ZYRTEC) 10 MG tablet Take 10 mg by mouth daily.        . Cholecalciferol (VITAMIN D3) 5000 UNITS TABS Take 5,000 mg by mouth daily.      Marland Kitchen dronabinol (MARINOL) 2.5 MG capsule Take 1 capsule (2.5 mg total) by mouth 2 (  two) times daily before a meal.  60 capsule  1  . HYDROcodone-acetaminophen (NORCO/VICODIN) 5-325 MG per tablet Take 1 tablet by mouth every 6 (six) hours as needed for moderate pain.  30 tablet  0  . lidocaine-prilocaine (EMLA) cream Apply 1 application topically as  needed.  30 g  1  . [place] Mushroom POWD 5 mLs by Does not apply route daily.      . meclizine (ANTIVERT) 25 MG tablet Take 1 tablet (25 mg total) by mouth 3 (three) times daily as needed for dizziness.  30 tablet  0  . mirtazapine (REMERON) 15 MG tablet       . Multiple Vitamins-Minerals (OSTEO COMPLEX PO) Take by mouth.      Marland Kitchen omeprazole (PRILOSEC) 40 MG capsule Take 40 mg by mouth daily.      . ondansetron (ZOFRAN) 4 MG tablet Take 1 tablet (4 mg total) by mouth every 6 (six) hours as needed for nausea.  20 tablet  0  . traMADol (ULTRAM) 50 MG tablet Take 0.5-1 tablets (25-50 mg total) by mouth every 6 (six) hours as needed for pain.  120 tablet  0  . venlafaxine XR (EFFEXOR-XR) 37.5 MG 24 hr capsule Take 1 capsule (37.5 mg total) by mouth daily with breakfast.  30 capsule  9   No current facility-administered medications for this visit.    SURGICAL HISTORY:  Past Surgical History  Procedure Laterality Date  . Mastectomy Right   . Tubal ligation  1971    REVIEW OF SYSTEMS:  A 10 point review of systems was conducted and is otherwise negative except for what is noted above.    PHYSICAL EXAMINATION: Blood pressure 127/86, pulse 130, temperature 97.8 F (36.6 C), temperature source Oral, resp. rate 20, height _0  (1.6 m), weight 102 lb 11.2 oz (46.584 kg). Body mass index is 18.2 kg/(m^2). General: Patient is a well appearing female, in wheelchair, improved from last exam, weight improved by 4 lbs HEENT: PERRLA, sclerae anicteric, conjunctival pallor, MMM Neck: supple, no palpable adenopathy Lungs: clear to auscultation bilaterally, no wheezes, rhonchi, or rales Cardiovascular: regular rate rhythm, S1, S2, no murmurs, rubs or gallops Abdomen: Soft, non-tender, non-distended, normoactive bowel sounds, no HSM Extremities: warm and well perfused, no clubbing, cyanosis, or edema Skin:  No rashes or lesions Neuro: Non-focal Breasts: deferred ECOG PERFORMANCE STATUS: 0 -  Asymptomatic  LABORATORY DATA: Lab Results  Component Value Date   WBC 8.2 01/08/2014   HGB 10.3* 01/08/2014   HCT 33.0* 01/08/2014   MCV 90.9 01/08/2014   PLT 36* 01/08/2014      Chemistry      Component Value Date/Time   NA 140 01/01/2014 0749   NA 140 11/04/2013 0454   K 4.1 01/01/2014 0749   K 3.8 11/04/2013 0454   CL 108 11/04/2013 0454   CL 105 04/17/2013 1323   CO2 25 01/01/2014 0749   CO2 23 11/04/2013 0454   BUN 12.4 01/01/2014 0749   BUN 8 11/04/2013 0454   CREATININE 0.6 01/01/2014 0749   CREATININE 0.43* 11/04/2013 0454      Component Value Date/Time   CALCIUM 8.7 01/01/2014 0749   CALCIUM 7.3* 11/04/2013 0454   ALKPHOS 169* 01/01/2014 0749   ALKPHOS 224* 11/03/2013 0548   AST 28 01/01/2014 0749   AST 31 11/03/2013 0548   ALT 15 01/01/2014 0749   ALT 13 11/03/2013 0548   BILITOT 0.54 01/01/2014 0749   BILITOT 0.4 11/03/2013 0548  REPORT OF SURGICAL PATHOLOGY  Case #: [id number] Patient Name: Gina [name], Gina [name]. Office Chart Number: N/A  MRN: [id number] Pathologist: Violet Baldy, MD DOB/Age Sep 27, 1946 (Age: 50) Gender: F Date Taken: 02/07/2007 Date Received: 02/07/2007  FINAL DIAGNOSIS  MICROSCOPIC EXAMINATION AND DIAGNOSIS  1. RIGHT AXILLARY SENTINEL LYMPH NODE, EXCISION: - METASTATIC CARCINOMA CONSISTENT WITH BREAST PRIMARY, SEE COMMENT.  2. RIGHT BREAST, NEEDLE LOCALIZATION BIOPSY: - RESIDUAL CARCINOMA IN TUMOR REGRESSION AREA. - CARCINOMA IS CLOSE TO MEDIAL AND LATERAL SURGICAL MARGINS OF RESECTION. - SEE ONCOLOGY TABLE  COMMENT 1. The sections show metastatic carcinoma on H The tumor is seen in the form of dispersed atypical epithelioid cells in a fibrotic background involving an area more than 0.2 cm consistent with metastatic carcinoma. No extranodal extension is identified. For confirmation, Cytokeratin stain (AE1/AE3) was performed and is positive. The control stained appropriately.  ONCOLOGY TABLE-BREAST, INCISIONAL/EXCISIONAL BIOPSY OR MASTECTOMY WITH LYMPH  NODES  1. Maximum tumor size (cm): Dispersed tumor cells in a tumor regression area measuring 1.8 cm, see comment 2. Margins: Tumor cells are seen less than 0.1 cm from the surgical margins of resection Invasive component, distance to closest margin: Less than 0.1 cm In situ component, distance to closest margin: N/A 3. Vascular/Lymphatic invasion: Yes 4. Histology, invasive component: Invasive mammary carcinoma consistent with lobular carcinoma 5. Grade, invasive component (Elston-Ellis modified Scarff-Bloom-Richardson): II Tubule formation grade: 3 Nuclear pleomorphism grade: 2 Mitotic grade: 1 6. Extensive intraductal component (JCRT): No 7. Histology, in situ component: N/A 8. Grade, in situ component: N/A 9. Multicentric (separate tumors in different quadrants): Unknown 10. Multifocal (separate tumors in same quadrant or biopsy): No 11. Axillary lymph nodes: #examined: 1 #with metastasis: 1 ITC (isolated tumor cells, < 0.56m): 0 Micrometastasis (> 0.261m < 32m27m 0 Metastasis > 2 mm: 1 Extracapsular extension: No 12. TNM Code: ypT1, pN1a, pMX 13. Breast Prognostic Markers: Case number [id number] receptor positive (100%) Progesterone receptor positive (3%) Ki 67 (Mib-1) 12% Her 2 neu (HercepTest) 2+ Her 2 neu by FISH N/A 14. Non-neoplastic breast: Fibrosis 15. Comments: Sections of the localized tissue show a fibrotic area measuring 1.8 cm and displaying dispersed atypical epithelioid cells throughout that area. Some of the cells surround the benign appearing ductal elements. The findings are subtle and most compatible with residual carcinoma. For confirmation cytokeratin stains were performed on several blocks representing the localized tissue and show strong positivity in previously described epithelioid cells confirming the morphologic impression of residual carcinoma. The carcinoma is less than 0.1 cm from the medial surgical margin of resection and  approximately 0.1 cm from the lateral surgical margin of resection. Since the findings are subtle and the tumor is essentially composed of dispersed cells throughout, the status of the medial and lateral margins in particular may be questionable and hence truly negative margins cannot be assured. I strongly recommend clinical correlation and followup. (BNS:caf 02/12/07)  RADIOGRAPHIC STUDIES:  No results found.  ASSESSMENT/PLAN: [age]ar old female with  #1 history of stage II invasive mammary carcinoma with lobular features originally diagnosed in 2007. She underwent neoadjuvant chemotherapy consisting of 4 cycles of dose dense FEC followed by dose dense Taxotere for 4 cycles. She then had a lumpectomy that revealed residual disease. She proceeded on to have a mastectomy. She was not a candidate for radiation therapy even though she did have an evaluation by radiation oncology.  #2 status post Femara 2.5 mg daily she overall tolerated it well. She completed 5 years of Femara. She discontinued this on  04/28/2013.  #3 Anemia work up started on 08/05/13.  After CT chest abd pelvis, PET scan, and bone marrow biopsy, patient was found to have metastatic breast cancer to the bone.  She will receive Xgeva every four weeks and take Arimidex daily.    #4 palliative chemotherapy: Patient is currently on Taxol weekly. I did dose attenuate her to 60 mg per meter squared. She will continue to do on a weekly basis. Her platelets are 36,000 patient is aware. Her white count is normal.  #5 malnutrition: Patient has gained 3 pounds which is significantly better. She is eating.  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 Today platelets are only 21,000. We will hold her chemotherapy.  #7 chronic pain: Much better controlled with her present medications.  #8 bone metastasis: Patient is receiving Xgev and tolerating it well. She understands risks and  benefits of this. This is being given on a monthly basis.  #9 patient will return in one week's time for next dose of chemotherapy   All questions were answered. The patient knows to call the clinic with any problems, questions or concerns. We can certainly see the patient much sooner if necessary.  I spent 20 minutes counseling the patient face to face. The total time spent in the appointment was 30 minutes.    Marcy Panning, MD Medical/Oncology [facility] [phone] (beeper) [phone] (Office)

## 2014-01-08 NOTE — Progress Notes (Signed)
Ok to treat per Dr Humphrey Rolls with platelets of 36.

## 2014-01-08 NOTE — Patient Instructions (Signed)
[facility] Discharge Instructions for Patients Receiving Chemotherapy  Today you received the following chemotherapy agents Taxol  To help prevent nausea and vomiting after your treatment, we encourage you to take your nausea medication    If you develop nausea and vomiting that is not controlled by your nausea medication, call the clinic.   BELOW ARE SYMPTOMS THAT SHOULD BE REPORTED IMMEDIATELY:  *FEVER GREATER THAN 100.5 F  *CHILLS WITH OR WITHOUT FEVER  NAUSEA AND VOMITING THAT IS NOT CONTROLLED WITH YOUR NAUSEA MEDICATION  *UNUSUAL SHORTNESS OF BREATH  *UNUSUAL BRUISING OR BLEEDING  TENDERNESS IN MOUTH AND THROAT WITH OR WITHOUT PRESENCE OF ULCERS  *URINARY PROBLEMS  *BOWEL PROBLEMS  UNUSUAL RASH Items with * indicate a potential emergency and should be followed up as soon as possible.  Feel free to call the clinic you have any questions or concerns. The clinic phone number is [phone].    

## 2014-01-08 NOTE — Progress Notes (Signed)
Met with patient and spouse in infusion room. Explored hope, coping, and resilience resources. Facilitated patient's telling the narrative of her illness. Listening; presence; support.  Epifania Gore, [facility], PhD

## 2014-01-14 ENCOUNTER — Other Ambulatory Visit: Payer: Self-pay | Admitting: *Deleted

## 2014-01-14 DIAGNOSIS — C50919 Malignant neoplasm of unspecified site of unspecified female breast: Secondary | ICD-10-CM

## 2014-01-15 ENCOUNTER — Other Ambulatory Visit (HOSPITAL_BASED_OUTPATIENT_CLINIC_OR_DEPARTMENT_OTHER): Payer: 59

## 2014-01-15 ENCOUNTER — Encounter: Payer: Self-pay | Admitting: Oncology

## 2014-01-15 ENCOUNTER — Ambulatory Visit (HOSPITAL_BASED_OUTPATIENT_CLINIC_OR_DEPARTMENT_OTHER): Payer: 59 | Admitting: Oncology

## 2014-01-15 ENCOUNTER — Ambulatory Visit (HOSPITAL_BASED_OUTPATIENT_CLINIC_OR_DEPARTMENT_OTHER): Payer: 59

## 2014-01-15 VITALS — BP 116/81 | HR 116 | Temp 98.5°F | Resp 18 | Ht 63.0 in | Wt 104.4 lb

## 2014-01-15 DIAGNOSIS — C50919 Malignant neoplasm of unspecified site of unspecified female breast: Secondary | ICD-10-CM

## 2014-01-15 DIAGNOSIS — C7951 Secondary malignant neoplasm of bone: Secondary | ICD-10-CM

## 2014-01-15 DIAGNOSIS — E46 Unspecified protein-calorie malnutrition: Secondary | ICD-10-CM

## 2014-01-15 DIAGNOSIS — C7952 Secondary malignant neoplasm of bone marrow: Secondary | ICD-10-CM

## 2014-01-15 DIAGNOSIS — D696 Thrombocytopenia, unspecified: Secondary | ICD-10-CM

## 2014-01-15 DIAGNOSIS — C50119 Malignant neoplasm of central portion of unspecified female breast: Secondary | ICD-10-CM

## 2014-01-15 DIAGNOSIS — Z5111 Encounter for antineoplastic chemotherapy: Secondary | ICD-10-CM

## 2014-01-15 DIAGNOSIS — G8929 Other chronic pain: Secondary | ICD-10-CM

## 2014-01-15 DIAGNOSIS — Z170 Estrogen receptor positive status [ER+]: Secondary | ICD-10-CM

## 2014-01-15 LAB — MANUAL DIFFERENTIAL
ALC: 2.7 10*3/uL (ref 0.9–3.3)
ANC (CHCC manual diff): 2.5 10*3/uL (ref 1.5–6.5)
BAND NEUTROPHILS: 10 % (ref 0–10)
Basophil: 7 % — ABNORMAL HIGH (ref 0–2)
Blasts: 2 % — ABNORMAL HIGH (ref 0–0)
EOS%: 9 % — AB (ref 0–7)
LYMPH: 34 % (ref 14–49)
MONO: 16 % — AB (ref 0–14)
Metamyelocytes: 8 % — ABNORMAL HIGH (ref 0–0)
Myelocytes: 3 % — ABNORMAL HIGH (ref 0–0)
NRBC: 34 % — AB (ref 0–0)
OTHER CELL: 0 % (ref 0–0)
PLT EST: DECREASED
PROMYELO: 1 % — ABNORMAL HIGH (ref 0–0)
SEG: 10 % — AB (ref 38–77)
Variant Lymph: 0 % (ref 0–0)

## 2014-01-15 LAB — COMPREHENSIVE METABOLIC PANEL (CC13)
ALK PHOS: 139 U/L (ref 40–150)
ALT: 13 U/L (ref 0–55)
AST: 33 U/L (ref 5–34)
Albumin: 3.4 g/dL — ABNORMAL LOW (ref 3.5–5.0)
Anion Gap: 10 mEq/L (ref 3–11)
BUN: 16.2 mg/dL (ref 7.0–26.0)
CHLORIDE: 105 meq/L (ref 98–109)
CO2: 25 meq/L (ref 22–29)
Calcium: 8.6 mg/dL (ref 8.4–10.4)
Creatinine: 0.7 mg/dL (ref 0.6–1.1)
GLUCOSE: 91 mg/dL (ref 70–140)
POTASSIUM: 4.5 meq/L (ref 3.5–5.1)
SODIUM: 140 meq/L (ref 136–145)
Total Bilirubin: 0.58 mg/dL (ref 0.20–1.20)
Total Protein: 6.9 g/dL (ref 6.4–8.3)

## 2014-01-15 LAB — CBC WITH DIFFERENTIAL/PLATELET
HCT: 30.9 % — ABNORMAL LOW (ref 34.8–46.6)
HEMOGLOBIN: 9.9 g/dL — AB (ref 11.6–15.9)
MCH: 28.7 pg (ref 25.1–34.0)
MCHC: 31.9 g/dL (ref 31.5–36.0)
MCV: 90 fL (ref 79.5–101.0)
Platelets: 25 10*3/uL — ABNORMAL LOW (ref 145–400)
RBC: 3.44 10*6/uL — ABNORMAL LOW (ref 3.70–5.45)
RDW: 18.3 % — AB (ref 11.2–14.5)
WBC: 8 10*3/uL (ref 3.9–10.3)

## 2014-01-15 MED ORDER — DEXAMETHASONE SODIUM PHOSPHATE 20 MG/5ML IJ SOLN
INTRAMUSCULAR | Status: AC
  Filled 2014-01-15: qty 5

## 2014-01-15 MED ORDER — DIPHENHYDRAMINE HCL 50 MG/ML IJ SOLN
INTRAMUSCULAR | Status: AC
  Filled 2014-01-15: qty 1

## 2014-01-15 MED ORDER — DEXAMETHASONE SODIUM PHOSPHATE 20 MG/5ML IJ SOLN
20.0000 mg | Freq: Once | INTRAMUSCULAR | Status: AC
  Administered 2014-01-15: 20 mg via INTRAVENOUS

## 2014-01-15 MED ORDER — SODIUM CHLORIDE 0.9 % IJ SOLN
10.0000 mL | INTRAMUSCULAR | Status: DC | PRN
  Administered 2014-01-15: 10 mL
  Filled 2014-01-15: qty 10

## 2014-01-15 MED ORDER — HEPARIN SOD (PORK) LOCK FLUSH 100 UNIT/ML IV SOLN
500.0000 [IU] | Freq: Once | INTRAVENOUS | Status: AC | PRN
  Administered 2014-01-15: 500 [IU]
  Filled 2014-01-15: qty 5

## 2014-01-15 MED ORDER — FAMOTIDINE IN NACL 20-0.9 MG/50ML-% IV SOLN
INTRAVENOUS | Status: AC
  Filled 2014-01-15: qty 50

## 2014-01-15 MED ORDER — SODIUM CHLORIDE 0.9 % IV SOLN
Freq: Once | INTRAVENOUS | Status: AC
  Administered 2014-01-15: 10:00:00 via INTRAVENOUS

## 2014-01-15 MED ORDER — DIPHENHYDRAMINE HCL 50 MG/ML IJ SOLN
25.0000 mg | Freq: Once | INTRAMUSCULAR | Status: AC
  Administered 2014-01-15: 25 mg via INTRAVENOUS

## 2014-01-15 MED ORDER — ONDANSETRON 8 MG/NS 50 ML IVPB
INTRAVENOUS | Status: AC
  Filled 2014-01-15: qty 8

## 2014-01-15 MED ORDER — ONDANSETRON 16 MG/50ML IVPB (CHCC)
INTRAVENOUS | Status: AC
  Filled 2014-01-15: qty 16

## 2014-01-15 MED ORDER — ONDANSETRON 8 MG/50ML IVPB (CHCC)
8.0000 mg | Freq: Once | INTRAVENOUS | Status: AC
  Administered 2014-01-15: 8 mg via INTRAVENOUS

## 2014-01-15 MED ORDER — FAMOTIDINE IN NACL 20-0.9 MG/50ML-% IV SOLN
20.0000 mg | Freq: Once | INTRAVENOUS | Status: AC
  Administered 2014-01-15: 20 mg via INTRAVENOUS

## 2014-01-15 MED ORDER — SODIUM CHLORIDE 0.9 % IV SOLN
60.0000 mg/m2 | Freq: Once | INTRAVENOUS | Status: AC
  Administered 2014-01-15: 84 mg via INTRAVENOUS
  Filled 2014-01-15: qty 14

## 2014-01-15 NOTE — Progress Notes (Signed)
Okay to treat today per Dr. Humphrey Rolls with platelet count 25. Cindi Carbon, RN

## 2014-01-15 NOTE — Progress Notes (Signed)
OFFICE PROGRESS NOTE  CC  Gina Kroner, MD [name], [address] [place] [address] Dr. 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 Diagnosed in October 2007  PRIOR THERAPY:  #1 patient presented with a mass in her right breast in October 2007. She underwent bilateral mammograms. The right breast ultrasound performed on 08/19/2006 showed increased density and distortion in the subareolar location of the right breast. Ultrasound showed abnormal echoes in the subareolar location which was a change measuring 1.5 cm. Patient underwent an ultrasound-guided biopsy on 08/23/2006 which showed invasive mammary carcinoma with lobular features. ER +100% PR 3% proliferation marker Ki-67 12% HER-2/neu 2+ by fish. MRI of the breasts on 08/30/2006 showed an ill-defined enhancing mass in the right breast measuring 2.6 x 4.0 x 4.1 cm. Enhancement trailed out to the nipple but did not involve the nipple. There was also a small enhancing internal mammary node measuring 7 mm suspicious for metastatic disease. Left breast was negative.  #2 patient was seen by Dr. Rich [name] on 09/09/2006. She received neoadjuvant chemotherapy initially consisting of FEC regimen given dose dense for 4 cycles followed by Taxotere. She received her chemotherapy she received her chemotherapy from 09/20/2006 through February 2008. She then went on to lumpectomy on 02/07/2007. However there was essentially residual tumor dispersed over an area 1.8 cm with some close surgical margins at the medial area. Her case was reviewed at the breast conference and it was suggested she undergo mastectomy. Patient underwent a mastectomy on 03/19/2007.  #3 she was subsequently seen by radiation oncology for consideration of post mastectomy radiation therapy. However, she was not given radiation.  #4 she was then begun on Femara 2.5  mg daily. She has now completed 5 years of therapy. It was discontinued in June 2014.    #5 Returned to clinic on 08/05/13 for evaluation and management of new anemia.  She has underwent PET/CT chest abdomen and pelvis which reveals diffuse lytic lesions throughout the axial and appendicular skeleton.  Bone marrow biopsy was consistent with metastatic ER/PR positive breast cancer.    #6 status post Arimidex    #7 palliative single agent Taxol being given weekly until patient has progression of disease  CURRENT THERAPY: Proceed with cycle 9 of dose attenuated Taxol  INTERVAL HISTORY: Gina [name] 68 y.o. female returns for follow up prior to chemotherapy. thus far she is tolerating well. 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 This is an ongoing concern. But she does not having any bleeding.  She is denying having any nausea vomiting fevers or chills. Her appetite does improving. She is in good spirits. She has no aches or pains her pains are well controlled with her present pain medications. She looks perfect. She is getting more weight. She has no bleeding problems. Remainder of the review of systems is negative.   MEDICAL HISTORY: Past Medical History  Diagnosis Date  . Breast cancer 04/28/2013  . Myalgia and myositis, unspecified   . Anemia     ALLERGIES:  is allergic to sudafed.  MEDICATIONS:  Current Outpatient Prescriptions  Medication Sig Dispense Refill  . cetirizine (ZYRTEC) 10 MG tablet Take 10 mg by mouth daily.        . Cholecalciferol (VITAMIN D3) 5000 UNITS TABS Take 5,000 mg by mouth daily.      Marland Kitchen dronabinol (MARINOL) 2.5 MG capsule Take 1 capsule (2.5 mg total) by mouth 2 (two) times  daily before a meal.  60 capsule  1  . HYDROcodone-acetaminophen (NORCO/VICODIN) 5-325 MG per tablet Take 1 tablet by mouth every 6 (six) hours as needed for moderate pain.  30 tablet  0  . lidocaine-prilocaine (EMLA) cream Apply 1 application topically as needed.  30 g  1  . [place] Mushroom POWD 5  mLs by Does not apply route daily.      . meclizine (ANTIVERT) 25 MG tablet Take 1 tablet (25 mg total) by mouth 3 (three) times daily as needed for dizziness.  30 tablet  0  . mirtazapine (REMERON) 15 MG tablet       . Multiple Vitamins-Minerals (OSTEO COMPLEX PO) Take by mouth.      Marland Kitchen omeprazole (PRILOSEC) 40 MG capsule Take 40 mg by mouth daily.      . ondansetron (ZOFRAN) 4 MG tablet Take 1 tablet (4 mg total) by mouth every 6 (six) hours as needed for nausea.  20 tablet  0  . traMADol (ULTRAM) 50 MG tablet Take 0.5-1 tablets (25-50 mg total) by mouth every 6 (six) hours as needed for pain.  120 tablet  0  . venlafaxine XR (EFFEXOR-XR) 37.5 MG 24 hr capsule Take 1 capsule (37.5 mg total) by mouth daily with breakfast.  30 capsule  9   No current facility-administered medications for this visit.    SURGICAL HISTORY:  Past Surgical History  Procedure Laterality Date  . Mastectomy Right   . Tubal ligation  1971    REVIEW OF SYSTEMS:  A 10 point review of systems was conducted and is otherwise negative except for what is noted above.    PHYSICAL EXAMINATION: There were no vitals taken for this visit. There is no weight on file to calculate BMI. General: Patient is a well appearing female, in wheelchair, improved from last exam, weight improved by 4 lbs HEENT: PERRLA, sclerae anicteric, conjunctival pallor, MMM Neck: supple, no palpable adenopathy Lungs: clear to auscultation bilaterally, no wheezes, rhonchi, or rales Cardiovascular: regular rate rhythm, S1, S2, no murmurs, rubs or gallops Abdomen: Soft, non-tender, non-distended, normoactive bowel sounds, no HSM Extremities: warm and well perfused, no clubbing, cyanosis, or edema Skin:  No rashes or lesions Neuro: Non-focal Breasts: deferred ECOG PERFORMANCE STATUS: 0 - Asymptomatic  LABORATORY DATA: Lab Results  Component Value Date   WBC 8.0 01/15/2014   HGB 9.9* 01/15/2014   HCT 30.9* 01/15/2014   MCV 90.0 01/15/2014   PLT  25* 01/15/2014      Chemistry      Component Value Date/Time   NA 139 01/08/2014 0812   NA 140 11/04/2013 0454   K 4.6 01/08/2014 0812   K 3.8 11/04/2013 0454   CL 108 11/04/2013 0454   CL 105 04/17/2013 1323   CO2 23 01/08/2014 0812   CO2 23 11/04/2013 0454   BUN 15.9 01/08/2014 0812   BUN 8 11/04/2013 0454   CREATININE 0.7 01/08/2014 0812   CREATININE 0.43* 11/04/2013 0454      Component Value Date/Time   CALCIUM 9.1 01/08/2014 0812   CALCIUM 7.3* 11/04/2013 0454   ALKPHOS 157* 01/08/2014 0812   ALKPHOS 224* 11/03/2013 0548   AST 30 01/08/2014 0812   AST 31 11/03/2013 0548   ALT 15 01/08/2014 0812   ALT 13 11/03/2013 0548   BILITOT 0.50 01/08/2014 0812   BILITOT 0.4 11/03/2013 0548    REPORT OF SURGICAL PATHOLOGY  Case #: [id number] Patient Name: Gina [name], Gina [name]. Office Chart Number: N/A  MRN:  [id number] Pathologist: Violet Baldy, MD DOB/Age [birth year] (Age: 52) Gender: F Date Taken: 02/07/2007 Date Received: 02/07/2007  FINAL DIAGNOSIS  MICROSCOPIC EXAMINATION AND DIAGNOSIS  1. RIGHT AXILLARY SENTINEL LYMPH NODE, EXCISION: - METASTATIC CARCINOMA CONSISTENT WITH BREAST PRIMARY, SEE COMMENT.  2. RIGHT BREAST, NEEDLE LOCALIZATION BIOPSY: - RESIDUAL CARCINOMA IN TUMOR REGRESSION AREA. - CARCINOMA IS CLOSE TO MEDIAL AND LATERAL SURGICAL MARGINS OF RESECTION. - SEE ONCOLOGY TABLE  COMMENT 1. The sections show metastatic carcinoma on H The tumor is seen in the form of dispersed atypical epithelioid cells in a fibrotic background involving an area more than 0.2 cm consistent with metastatic carcinoma. No extranodal extension is identified. For confirmation, Cytokeratin stain (AE1/AE3) was performed and is positive. The control stained appropriately.  ONCOLOGY TABLE-BREAST, INCISIONAL/EXCISIONAL BIOPSY OR MASTECTOMY WITH LYMPH NODES  1. Maximum tumor size (cm): Dispersed tumor cells in a tumor regression area measuring 1.8 cm, see comment 2. Margins: Tumor cells are seen less  than 0.1 cm from the surgical margins of resection Invasive component, distance to closest margin: Less than 0.1 cm In situ component, distance to closest margin: N/A 3. Vascular/Lymphatic invasion: Yes 4. Histology, invasive component: Invasive mammary carcinoma consistent with lobular carcinoma 5. Grade, invasive component (Elston-Ellis modified Scarff-Bloom-Richardson): II Tubule formation grade: 3 Nuclear pleomorphism grade: 2 Mitotic grade: 1 6. Extensive intraductal component (JCRT): No 7. Histology, in situ component: N/A 8. Grade, in situ component: N/A 9. Multicentric (separate tumors in different quadrants): Unknown 10. Multifocal (separate tumors in same quadrant or biopsy): No 11. Axillary lymph nodes: #examined: 1 #with metastasis: 1 ITC (isolated tumor cells, < 0.15m): 0 Micrometastasis (> 0.28m < 52m33m 0 Metastasis > 2 mm: 1 Extracapsular extension: No 12. TNM Code: ypT1, pN1a, pMX 13. Breast Prognostic Markers: Case number [id number] receptor positive (100%) Progesterone receptor positive (3%) Ki 67 (Mib-1) 12% Her 2 neu (HercepTest) 2+ Her 2 neu by FISH N/A 14. Non-neoplastic breast: Fibrosis 15. Comments: Sections of the localized tissue show a fibrotic area measuring 1.8 cm and displaying dispersed atypical epithelioid cells throughout that area. Some of the cells surround the benign appearing ductal elements. The findings are subtle and most compatible with residual carcinoma. For confirmation cytokeratin stains were performed on several blocks representing the localized tissue and show strong positivity in previously described epithelioid cells confirming the morphologic impression of residual carcinoma. The carcinoma is less than 0.1 cm from the medial surgical margin of resection and approximately 0.1 cm from the lateral surgical margin of resection. Since the findings are subtle and the tumor is essentially composed of dispersed cells  throughout, the status of the medial and lateral margins in particular may be questionable and hence truly negative margins cannot be assured. I strongly recommend clinical correlation and followup. (BNS:caf 02/12/07)  RADIOGRAPHIC STUDIES:  No results found.  ASSESSMENT/PLAN: [age]ar old female with  #1 history of stage II invasive mammary carcinoma with lobular features originally diagnosed in 2007. She underwent neoadjuvant chemotherapy consisting of 4 cycles of dose dense FEC followed by dose dense Taxotere for 4 cycles. She then had a lumpectomy that revealed residual disease. She proceeded on to have a mastectomy. She was not a candidate for radiation therapy even though she did have an evaluation by radiation oncology.  #2 status post Femara 2.5 mg daily she overall tolerated it well. She completed 5 years of Femara. She discontinued this on 04/28/2013.  #3 Anemia work up started on 08/05/13.  After CT chest abd pelvis, PET scan, and bone  marrow biopsy, patient was found to have metastatic breast cancer to the bone.  She will receive Xgeva every four weeks and take Arimidex daily.    #4 palliative chemotherapy: Patient is currently on Taxol weekly. I did dose attenuate her to 60 mg per meter squared. She will continue to do on a weekly basis. Her platelets are 25,000 patient is aware. Marland Kitchen She will receive cycle #9 of Taxol today. Patient is aware that her counts are low as far as the platelets are concerned. Hemoglobin hematocrit and white count is normal with an ANC of 2.5.  #5 malnutrition: Patient has gained a few more pounds which is significantly better. She is eating.  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 Today platelets are only 25,000.   #7 chronic pain: Patient tells me that she is not on pain medications. Her pain is very well-controlled. She is able to do activities of daily living  #8 bone metastasis: Patient is  receiving Xgev and tolerating it well. She understands risks and benefits of this. This is being given on a monthly basis.  #9 patient will return in one week's time for next dose of chemotherapy   All questions were answered. The patient knows to call the clinic with any problems, questions or concerns. We can certainly see the patient much sooner if necessary.  I spent 20 minutes counseling the patient face to face. The total time spent in the appointment was 30 minutes.    Marcy Panning, MD Medical/Oncology [facility] [phone] (beeper) [phone] (Office)

## 2014-01-15 NOTE — Patient Instructions (Signed)
[facility] Discharge Instructions for Patients Receiving Chemotherapy  Today you received the following chemotherapy agents: Taxol.  To help prevent nausea and vomiting after your treatment, we encourage you to take your nausea medication as prescribed.   If you develop nausea and vomiting that is not controlled by your nausea medication, call the clinic.   BELOW ARE SYMPTOMS THAT SHOULD BE REPORTED IMMEDIATELY:  *FEVER GREATER THAN 100.5 F  *CHILLS WITH OR WITHOUT FEVER  NAUSEA AND VOMITING THAT IS NOT CONTROLLED WITH YOUR NAUSEA MEDICATION  *UNUSUAL SHORTNESS OF BREATH  *UNUSUAL BRUISING OR BLEEDING  TENDERNESS IN MOUTH AND THROAT WITH OR WITHOUT PRESENCE OF ULCERS  *URINARY PROBLEMS  *BOWEL PROBLEMS  UNUSUAL RASH Items with * indicate a potential emergency and should be followed up as soon as possible.  Feel free to call the clinic you have any questions or concerns. The clinic phone number is [phone].    

## 2014-01-15 NOTE — Progress Notes (Signed)
Per Dr. Humphrey Rolls, treat despite platelet count of 25k.

## 2014-01-21 ENCOUNTER — Other Ambulatory Visit: Payer: Self-pay

## 2014-01-21 DIAGNOSIS — C50919 Malignant neoplasm of unspecified site of unspecified female breast: Secondary | ICD-10-CM

## 2014-01-22 ENCOUNTER — Telehealth: Payer: Self-pay | Admitting: *Deleted

## 2014-01-22 ENCOUNTER — Telehealth: Payer: Self-pay | Admitting: Oncology

## 2014-01-22 ENCOUNTER — Ambulatory Visit: Payer: 59

## 2014-01-22 ENCOUNTER — Ambulatory Visit (HOSPITAL_BASED_OUTPATIENT_CLINIC_OR_DEPARTMENT_OTHER): Payer: 59

## 2014-01-22 ENCOUNTER — Other Ambulatory Visit: Payer: Self-pay | Admitting: Oncology

## 2014-01-22 ENCOUNTER — Encounter: Payer: Self-pay | Admitting: Specialist

## 2014-01-22 ENCOUNTER — Other Ambulatory Visit (HOSPITAL_BASED_OUTPATIENT_CLINIC_OR_DEPARTMENT_OTHER): Payer: 59

## 2014-01-22 ENCOUNTER — Other Ambulatory Visit: Payer: Self-pay | Admitting: *Deleted

## 2014-01-22 VITALS — BP 101/72 | HR 102 | Temp 98.3°F | Resp 20

## 2014-01-22 DIAGNOSIS — C50119 Malignant neoplasm of central portion of unspecified female breast: Secondary | ICD-10-CM

## 2014-01-22 DIAGNOSIS — C7951 Secondary malignant neoplasm of bone: Secondary | ICD-10-CM

## 2014-01-22 DIAGNOSIS — C7952 Secondary malignant neoplasm of bone marrow: Secondary | ICD-10-CM

## 2014-01-22 DIAGNOSIS — Z5111 Encounter for antineoplastic chemotherapy: Secondary | ICD-10-CM

## 2014-01-22 DIAGNOSIS — C50919 Malignant neoplasm of unspecified site of unspecified female breast: Secondary | ICD-10-CM

## 2014-01-22 LAB — COMPREHENSIVE METABOLIC PANEL (CC13)
ALT: 15 U/L (ref 0–55)
AST: 40 U/L — ABNORMAL HIGH (ref 5–34)
Albumin: 3.6 g/dL (ref 3.5–5.0)
Alkaline Phosphatase: 130 U/L (ref 40–150)
Anion Gap: 11 mEq/L (ref 3–11)
BUN: 19.1 mg/dL (ref 7.0–26.0)
CHLORIDE: 105 meq/L (ref 98–109)
CO2: 24 mEq/L (ref 22–29)
CREATININE: 0.7 mg/dL (ref 0.6–1.1)
Calcium: 8.7 mg/dL (ref 8.4–10.4)
GLUCOSE: 97 mg/dL (ref 70–140)
Potassium: 4.3 mEq/L (ref 3.5–5.1)
Sodium: 139 mEq/L (ref 136–145)
Total Bilirubin: 0.69 mg/dL (ref 0.20–1.20)
Total Protein: 7.1 g/dL (ref 6.4–8.3)

## 2014-01-22 LAB — MANUAL DIFFERENTIAL
ALC: 1.6 10*3/uL (ref 0.9–3.3)
ANC (CHCC manual diff): 3.4 10*3/uL (ref 1.5–6.5)
BAND NEUTROPHILS: 11 % — AB (ref 0–10)
Basophil: 4 % — ABNORMAL HIGH (ref 0–2)
Blasts: 0 % (ref 0–0)
EOS: 6 % (ref 0–7)
LYMPH: 21 % (ref 14–49)
METAMYELOCYTES PCT: 13 % — AB (ref 0–0)
MONO: 23 % — AB (ref 0–14)
Myelocytes: 6 % — ABNORMAL HIGH (ref 0–0)
Other Cell: 0 % (ref 0–0)
PLT EST: DECREASED
PROMYELO: 2 % — ABNORMAL HIGH (ref 0–0)
SEG: 14 % — ABNORMAL LOW (ref 38–77)
VARIANT LYMPH: 0 % (ref 0–0)
nRBC: 32 % — ABNORMAL HIGH (ref 0–0)

## 2014-01-22 LAB — CBC WITH DIFFERENTIAL/PLATELET
HEMATOCRIT: 31.5 % — AB (ref 34.8–46.6)
HEMOGLOBIN: 9.9 g/dL — AB (ref 11.6–15.9)
MCH: 28.5 pg (ref 25.1–34.0)
MCHC: 31.4 g/dL — ABNORMAL LOW (ref 31.5–36.0)
MCV: 90.8 fL (ref 79.5–101.0)
Platelets: 31 10*3/uL — ABNORMAL LOW (ref 145–400)
RBC: 3.47 10*6/uL — ABNORMAL LOW (ref 3.70–5.45)
RDW: 18.8 % — AB (ref 11.2–14.5)
WBC: 7.7 10*3/uL (ref 3.9–10.3)

## 2014-01-22 MED ORDER — SODIUM CHLORIDE 0.9 % IV SOLN
Freq: Once | INTRAVENOUS | Status: AC
  Administered 2014-01-22: 11:00:00 via INTRAVENOUS

## 2014-01-22 MED ORDER — DEXAMETHASONE SODIUM PHOSPHATE 20 MG/5ML IJ SOLN
20.0000 mg | Freq: Once | INTRAMUSCULAR | Status: AC
  Administered 2014-01-22: 20 mg via INTRAVENOUS

## 2014-01-22 MED ORDER — HEPARIN SOD (PORK) LOCK FLUSH 100 UNIT/ML IV SOLN
500.0000 [IU] | Freq: Once | INTRAVENOUS | Status: AC | PRN
  Administered 2014-01-22: 500 [IU]
  Filled 2014-01-22: qty 5

## 2014-01-22 MED ORDER — FAMOTIDINE IN NACL 20-0.9 MG/50ML-% IV SOLN
INTRAVENOUS | Status: AC
  Filled 2014-01-22: qty 50

## 2014-01-22 MED ORDER — DIPHENHYDRAMINE HCL 50 MG/ML IJ SOLN
INTRAMUSCULAR | Status: AC
  Filled 2014-01-22: qty 1

## 2014-01-22 MED ORDER — FAMOTIDINE IN NACL 20-0.9 MG/50ML-% IV SOLN
20.0000 mg | Freq: Once | INTRAVENOUS | Status: AC
  Administered 2014-01-22: 20 mg via INTRAVENOUS

## 2014-01-22 MED ORDER — ONDANSETRON 8 MG/50ML IVPB (CHCC)
8.0000 mg | Freq: Once | INTRAVENOUS | Status: AC
  Administered 2014-01-22: 8 mg via INTRAVENOUS

## 2014-01-22 MED ORDER — SODIUM CHLORIDE 0.9 % IV SOLN
60.0000 mg/m2 | Freq: Once | INTRAVENOUS | Status: AC
  Administered 2014-01-22: 84 mg via INTRAVENOUS
  Filled 2014-01-22: qty 14

## 2014-01-22 MED ORDER — SODIUM CHLORIDE 0.9 % IJ SOLN
10.0000 mL | INTRAMUSCULAR | Status: DC | PRN
  Administered 2014-01-22: 10 mL
  Filled 2014-01-22: qty 10

## 2014-01-22 MED ORDER — ONDANSETRON 8 MG/NS 50 ML IVPB
INTRAVENOUS | Status: AC
  Filled 2014-01-22: qty 8

## 2014-01-22 MED ORDER — DIPHENHYDRAMINE HCL 50 MG/ML IJ SOLN
25.0000 mg | Freq: Once | INTRAMUSCULAR | Status: AC
  Administered 2014-01-22: 25 mg via INTRAVENOUS

## 2014-01-22 MED ORDER — DEXAMETHASONE SODIUM PHOSPHATE 20 MG/5ML IJ SOLN
INTRAMUSCULAR | Status: AC
  Filled 2014-01-22: qty 5

## 2014-01-22 NOTE — Telephone Encounter (Signed)
Dr Humphrey Rolls called to say Mr Inthavong called her regarding the cancellation of today's chemotherapy. Dr Humphrey Rolls is OK treating her with current labs and would like her rescheduled for treatment today.

## 2014-01-22 NOTE — Patient Instructions (Signed)
[facility] Discharge Instructions for Patients Receiving Chemotherapy  Today you received the following chemotherapy agents: Taxol  To help prevent nausea and vomiting after your treatment, we encourage you to take your nausea medication as prescribed by your physician.  If you develop nausea and vomiting that is not controlled by your nausea medication, call the clinic.   BELOW ARE SYMPTOMS THAT SHOULD BE REPORTED IMMEDIATELY:  *FEVER GREATER THAN 100.5 F  *CHILLS WITH OR WITHOUT FEVER  NAUSEA AND VOMITING THAT IS NOT CONTROLLED WITH YOUR NAUSEA MEDICATION  *UNUSUAL SHORTNESS OF BREATH  *UNUSUAL BRUISING OR BLEEDING  TENDERNESS IN MOUTH AND THROAT WITH OR WITHOUT PRESENCE OF ULCERS  *URINARY PROBLEMS  *BOWEL PROBLEMS  UNUSUAL RASH Items with * indicate a potential emergency and should be followed up as soon as possible.  Feel free to call the clinic you have any questions or concerns. The clinic phone number is [phone].    

## 2014-01-22 NOTE — Progress Notes (Unsigned)
I was called today regarding Ms. Kope, with a platelet count of less than 50,000. We are holding her treatment today. I do not find that she has a followup appointment scheduled at this point we either with Dr. Humphrey Rolls or Meredith Leeds. I am placing a request for the patient to see Ria Comment on March 3 at 2:15 PM to discuss further treatment plans.

## 2014-01-22 NOTE — Progress Notes (Signed)
Support for patient. She was discouraged that she could not have her treatment today.  Listening; encouragement.  Epifania Gore, [facility] PhD

## 2014-01-22 NOTE — Telephone Encounter (Signed)
, °

## 2014-01-22 NOTE — Progress Notes (Signed)
Unable to treat pt today due to platelet count of 31 per Dr. Jana Hakim.  Spoke with patient in lobby, pt verbalized understanding.  Return appointments made for follow up.

## 2014-01-27 ENCOUNTER — Telehealth: Payer: Self-pay | Admitting: *Deleted

## 2014-01-27 ENCOUNTER — Other Ambulatory Visit: Payer: Self-pay | Admitting: Oncology

## 2014-01-27 ENCOUNTER — Telehealth: Payer: Self-pay | Admitting: Oncology

## 2014-01-27 DIAGNOSIS — C50919 Malignant neoplasm of unspecified site of unspecified female breast: Secondary | ICD-10-CM

## 2014-01-27 NOTE — Telephone Encounter (Signed)
, °

## 2014-01-27 NOTE — Telephone Encounter (Signed)
Per staff message and POF I have scheduled appts.  JMW  

## 2014-01-28 ENCOUNTER — Other Ambulatory Visit: Payer: Self-pay | Admitting: Oncology

## 2014-01-29 ENCOUNTER — Other Ambulatory Visit (HOSPITAL_BASED_OUTPATIENT_CLINIC_OR_DEPARTMENT_OTHER): Payer: 59

## 2014-01-29 ENCOUNTER — Ambulatory Visit (HOSPITAL_BASED_OUTPATIENT_CLINIC_OR_DEPARTMENT_OTHER): Payer: 59

## 2014-01-29 ENCOUNTER — Ambulatory Visit: Payer: 59

## 2014-01-29 ENCOUNTER — Encounter: Payer: Self-pay | Admitting: Oncology

## 2014-01-29 ENCOUNTER — Ambulatory Visit (HOSPITAL_BASED_OUTPATIENT_CLINIC_OR_DEPARTMENT_OTHER): Payer: 59 | Admitting: Oncology

## 2014-01-29 VITALS — BP 127/78 | HR 123 | Temp 98.0°F | Resp 18 | Ht 63.0 in | Wt 105.2 lb

## 2014-01-29 DIAGNOSIS — D649 Anemia, unspecified: Secondary | ICD-10-CM

## 2014-01-29 DIAGNOSIS — D696 Thrombocytopenia, unspecified: Secondary | ICD-10-CM

## 2014-01-29 DIAGNOSIS — C50119 Malignant neoplasm of central portion of unspecified female breast: Secondary | ICD-10-CM

## 2014-01-29 DIAGNOSIS — C7952 Secondary malignant neoplasm of bone marrow: Secondary | ICD-10-CM

## 2014-01-29 DIAGNOSIS — C7951 Secondary malignant neoplasm of bone: Secondary | ICD-10-CM

## 2014-01-29 DIAGNOSIS — Z170 Estrogen receptor positive status [ER+]: Secondary | ICD-10-CM

## 2014-01-29 DIAGNOSIS — Z5111 Encounter for antineoplastic chemotherapy: Secondary | ICD-10-CM

## 2014-01-29 DIAGNOSIS — C50919 Malignant neoplasm of unspecified site of unspecified female breast: Secondary | ICD-10-CM

## 2014-01-29 DIAGNOSIS — D6959 Other secondary thrombocytopenia: Secondary | ICD-10-CM

## 2014-01-29 LAB — CBC WITH DIFFERENTIAL/PLATELET
HCT: 31 % — ABNORMAL LOW (ref 34.8–46.6)
HGB: 9.6 g/dL — ABNORMAL LOW (ref 11.6–15.9)
MCH: 27.9 pg (ref 25.1–34.0)
MCHC: 31 g/dL — AB (ref 31.5–36.0)
MCV: 90.1 fL (ref 79.5–101.0)
PLATELETS: 29 10*3/uL — AB (ref 145–400)
RBC: 3.44 10*6/uL — AB (ref 3.70–5.45)
RDW: 18.8 % — ABNORMAL HIGH (ref 11.2–14.5)
WBC: 7.5 10*3/uL (ref 3.9–10.3)

## 2014-01-29 LAB — MANUAL DIFFERENTIAL
ALC: 2.5 10*3/uL (ref 0.9–3.3)
ANC (CHCC manual diff): 2 10*3/uL (ref 1.5–6.5)
Band Neutrophils: 3 % (ref 0–10)
Basophil: 8 % — ABNORMAL HIGH (ref 0–2)
Blasts: 3 % — ABNORMAL HIGH (ref 0–0)
EOS: 9 % — ABNORMAL HIGH (ref 0–7)
LYMPH: 34 % (ref 14–49)
MONO: 19 % — AB (ref 0–14)
Metamyelocytes: 6 % — ABNORMAL HIGH (ref 0–0)
Myelocytes: 9 % — ABNORMAL HIGH (ref 0–0)
OTHER CELL: 0 % (ref 0–0)
PLT EST: DECREASED
PROMYELO: 0 % (ref 0–0)
SEG: 9 % — AB (ref 38–77)
Variant Lymph: 0 % (ref 0–0)
nRBC: 20 % — ABNORMAL HIGH (ref 0–0)

## 2014-01-29 LAB — COMPREHENSIVE METABOLIC PANEL (CC13)
ALBUMIN: 3.4 g/dL — AB (ref 3.5–5.0)
ALK PHOS: 120 U/L (ref 40–150)
ALT: 13 U/L (ref 0–55)
AST: 38 U/L — AB (ref 5–34)
Anion Gap: 10 mEq/L (ref 3–11)
BUN: 16 mg/dL (ref 7.0–26.0)
CO2: 25 mEq/L (ref 22–29)
CREATININE: 0.7 mg/dL (ref 0.6–1.1)
Calcium: 9 mg/dL (ref 8.4–10.4)
Chloride: 104 mEq/L (ref 98–109)
Glucose: 122 mg/dl (ref 70–140)
POTASSIUM: 4.4 meq/L (ref 3.5–5.1)
Sodium: 139 mEq/L (ref 136–145)
Total Bilirubin: 0.56 mg/dL (ref 0.20–1.20)
Total Protein: 6.9 g/dL (ref 6.4–8.3)

## 2014-01-29 MED ORDER — ONDANSETRON 8 MG/50ML IVPB (CHCC)
8.0000 mg | Freq: Once | INTRAVENOUS | Status: AC
  Administered 2014-01-29: 8 mg via INTRAVENOUS

## 2014-01-29 MED ORDER — SODIUM CHLORIDE 0.9 % IV SOLN
Freq: Once | INTRAVENOUS | Status: AC
  Administered 2014-01-29: 10:00:00 via INTRAVENOUS

## 2014-01-29 MED ORDER — DIPHENHYDRAMINE HCL 50 MG/ML IJ SOLN
INTRAMUSCULAR | Status: AC
  Filled 2014-01-29: qty 1

## 2014-01-29 MED ORDER — DIPHENHYDRAMINE HCL 50 MG/ML IJ SOLN
25.0000 mg | Freq: Once | INTRAMUSCULAR | Status: AC
  Administered 2014-01-29: 25 mg via INTRAVENOUS

## 2014-01-29 MED ORDER — SODIUM CHLORIDE 0.9 % IV SOLN
60.0000 mg/m2 | Freq: Once | INTRAVENOUS | Status: AC
  Administered 2014-01-29: 84 mg via INTRAVENOUS
  Filled 2014-01-29: qty 14

## 2014-01-29 MED ORDER — FAMOTIDINE IN NACL 20-0.9 MG/50ML-% IV SOLN
INTRAVENOUS | Status: AC
  Filled 2014-01-29: qty 50

## 2014-01-29 MED ORDER — SODIUM CHLORIDE 0.9 % IJ SOLN
10.0000 mL | INTRAMUSCULAR | Status: DC | PRN
  Administered 2014-01-29: 10 mL
  Filled 2014-01-29: qty 10

## 2014-01-29 MED ORDER — DEXAMETHASONE SODIUM PHOSPHATE 20 MG/5ML IJ SOLN
INTRAMUSCULAR | Status: AC
  Filled 2014-01-29: qty 5

## 2014-01-29 MED ORDER — ONDANSETRON 8 MG/NS 50 ML IVPB
INTRAVENOUS | Status: AC
  Filled 2014-01-29: qty 8

## 2014-01-29 MED ORDER — DENOSUMAB 120 MG/1.7ML ~~LOC~~ SOLN
120.0000 mg | Freq: Once | SUBCUTANEOUS | Status: AC
  Administered 2014-01-29: 120 mg via SUBCUTANEOUS
  Filled 2014-01-29: qty 1.7

## 2014-01-29 MED ORDER — DRONABINOL 2.5 MG PO CAPS: 2.5000 mg | ORAL_CAPSULE | Freq: Two times a day (BID) | ORAL | Status: DC

## 2014-01-29 MED ORDER — HEPARIN SOD (PORK) LOCK FLUSH 100 UNIT/ML IV SOLN
500.0000 [IU] | Freq: Once | INTRAVENOUS | Status: AC | PRN
  Administered 2014-01-29: 500 [IU]
  Filled 2014-01-29: qty 5

## 2014-01-29 MED ORDER — DEXAMETHASONE SODIUM PHOSPHATE 20 MG/5ML IJ SOLN
20.0000 mg | Freq: Once | INTRAMUSCULAR | Status: AC
  Administered 2014-01-29: 20 mg via INTRAVENOUS

## 2014-01-29 MED ORDER — FAMOTIDINE IN NACL 20-0.9 MG/50ML-% IV SOLN
20.0000 mg | Freq: Once | INTRAVENOUS | Status: AC
  Administered 2014-01-29: 20 mg via INTRAVENOUS

## 2014-01-29 NOTE — Patient Instructions (Addendum)
[facility] Discharge Instructions for Patients Receiving Chemotherapy  Today you received the following chemotherapy agents: taxol  To help prevent nausea and vomiting after your treatment, we encourage you to take your nausea medication as prescribed.    If you develop nausea and vomiting that is not controlled by your nausea medication, call the clinic.   BELOW ARE SYMPTOMS THAT SHOULD BE REPORTED IMMEDIATELY:  *FEVER GREATER THAN 100.5 F  *CHILLS WITH OR WITHOUT FEVER  NAUSEA AND VOMITING THAT IS NOT CONTROLLED WITH YOUR NAUSEA MEDICATION  *UNUSUAL SHORTNESS OF BREATH  *UNUSUAL BRUISING OR BLEEDING  TENDERNESS IN MOUTH AND THROAT WITH OR WITHOUT PRESENCE OF ULCERS  *URINARY PROBLEMS  *BOWEL PROBLEMS  UNUSUAL RASH Items with * indicate a potential emergency and should be followed up as soon as possible.  Feel free to call the clinic you have any questions or concerns. The clinic phone number is [phone].    

## 2014-01-29 NOTE — Progress Notes (Signed)
Okay to treat with platelet count 29 per Dr. Humphrey Rolls. Cindi Carbon, RN

## 2014-02-01 NOTE — Progress Notes (Signed)
OFFICE PROGRESS NOTE  CC  Gina Kroner, MD [address], [address] [place] [address] Dr. 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 Diagnosed in October 2007 as T1N1  PRIOR THERAPY:  #1 patient presented with a mass in her right breast in October 2007. She underwent bilateral mammograms. The right breast ultrasound performed on 08/19/2006 showed increased density and distortion in the subareolar location of the right breast. Ultrasound showed abnormal echoes in the subareolar location which was a change measuring 1.5 cm. Patient underwent an ultrasound-guided biopsy on 08/23/2006 which showed invasive mammary carcinoma with lobular features. ER +100% PR 3% proliferation marker Ki-67 12% HER-2/neu 2+ by fish. MRI of the breasts on 08/30/2006 showed an ill-defined enhancing mass in the right breast measuring 2.6 x 4.0 x 4.1 cm. Enhancement trailed out to the nipple but did not involve the nipple. There was also a small enhancing internal mammary node measuring 7 mm suspicious for metastatic disease. Left breast was negative.  #2 patient was seen by Dr. Eston [name] on 09/09/2006. She received neoadjuvant chemotherapy initially consisting of FEC regimen given dose dense for 4 cycles followed by Taxotere. She received her chemotherapy she received her chemotherapy from 09/20/2006 through February 2008. She then went on to lumpectomy on 02/07/2007. However there was essentially residual tumor dispersed over an area 1.8 cm with some close surgical margins at the medial area. Her case was reviewed at the breast conference and it was suggested she undergo mastectomy. Patient underwent a mastectomy on 03/19/2007.  #3 she was subsequently seen by radiation oncology for consideration of post mastectomy radiation therapy. However, she was not given radiation.  #4 she was then begun on  Femara 2.5 mg daily. She has now completed 5 years of therapy. It was discontinued in June 2014.    #5 Returned to clinic on 08/05/13 for evaluation and management of new anemia.  She has underwent PET/CT chest abdomen and pelvis which reveals diffuse lytic lesions throughout the axial and appendicular skeleton.  Bone marrow biopsy was consistent with metastatic ER/PR positive breast cancer.    #6 status post Arimidex    #7 palliative single agent Taxol being given weekly until patient has progression of disease  CURRENT THERAPY: Proceed with cycle 11 of dose attenuated Taxol  INTERVAL HISTORY: Gina [name] 68 y.o. female returns for follow up prior to chemotherapy. thus far she is tolerating well. she does remain thrombocytopenic with platelets today at 29,000. This is an ongoing concern. But she does not having any bleeding.  Patient and her husband want to be treated. She is denying having any nausea vomiting fevers or chills. Her appetite does improving. She is in good spirits. She has no aches or pains her pains are well controlled with her present pain medications. She looks perfect. She is getting more weight. She has no bleeding problems. Remainder of the review of systems is negative.   MEDICAL HISTORY: Past Medical History  Diagnosis Date  . Breast cancer 04/28/2013  . Myalgia and myositis, unspecified   . Anemia     ALLERGIES:  is allergic to sudafed.  MEDICATIONS:  Current Outpatient Prescriptions  Medication Sig Dispense Refill  . cetirizine (ZYRTEC) 10 MG tablet Take 10 mg by mouth daily.        . Cholecalciferol (VITAMIN D3) 5000 UNITS TABS Take 5,000 mg by mouth daily.      Marland Kitchen dronabinol (  MARINOL) 2.5 MG capsule Take 1 capsule (2.5 mg total) by mouth 2 (two) times daily before a meal.  60 capsule  1  . HYDROcodone-acetaminophen (NORCO/VICODIN) 5-325 MG per tablet Take 1 tablet by mouth every 6 (six) hours as needed for moderate pain.  30 tablet  0  . lidocaine-prilocaine  (EMLA) cream Apply 1 application topically as needed.  30 g  1  . [place] Mushroom POWD 5 mLs by Does not apply route daily.      . meclizine (ANTIVERT) 25 MG tablet Take 1 tablet (25 mg total) by mouth 3 (three) times daily as needed for dizziness.  30 tablet  0  . mirtazapine (REMERON) 15 MG tablet       . Multiple Vitamins-Minerals (OSTEO COMPLEX PO) Take by mouth.      Marland Kitchen omeprazole (PRILOSEC) 40 MG capsule Take 40 mg by mouth daily.      . ondansetron (ZOFRAN) 4 MG tablet Take 1 tablet (4 mg total) by mouth every 6 (six) hours as needed for nausea.  20 tablet  0  . traMADol (ULTRAM) 50 MG tablet Take 0.5-1 tablets (25-50 mg total) by mouth every 6 (six) hours as needed for pain.  120 tablet  0  . venlafaxine XR (EFFEXOR-XR) 37.5 MG 24 hr capsule Take 1 capsule (37.5 mg total) by mouth daily with breakfast.  30 capsule  9   No current facility-administered medications for this visit.    SURGICAL HISTORY:  Past Surgical History  Procedure Laterality Date  . Mastectomy Right   . Tubal ligation  1971    REVIEW OF SYSTEMS:  A 10 point review of systems was conducted and is otherwise negative except for what is noted above.    PHYSICAL EXAMINATION: Blood pressure 127/78, pulse 123, temperature 98 F (36.7 C), temperature source Oral, resp. rate 18, height 5' 3"  (1.6 m), weight 105 lb 3.2 oz (47.718 kg). Body mass index is 18.64 kg/(m^2). General: Patient is a well appearing female, in wheelchair, improved from last exam, weight improved by 4 lbs HEENT: PERRLA, sclerae anicteric, conjunctival pallor, MMM Neck: supple, no palpable adenopathy Lungs: clear to auscultation bilaterally, no wheezes, rhonchi, or rales Cardiovascular: regular rate rhythm, S1, S2, no murmurs, rubs or gallops Abdomen: Soft, non-tender, non-distended, normoactive bowel sounds, no HSM Extremities: warm and well perfused, no clubbing, cyanosis, or edema Skin:  No rashes or lesions Neuro: Non-focal Breasts:  deferred ECOG PERFORMANCE STATUS: 0 - Asymptomatic  LABORATORY DATA: Lab Results  Component Value Date   WBC 7.5 01/29/2014   HGB 9.6* 01/29/2014   HCT 31.0* 01/29/2014   MCV 90.1 01/29/2014   PLT 29* 01/29/2014      Chemistry      Component Value Date/Time   NA 139 01/29/2014 0903   NA 140 11/04/2013 0454   K 4.4 01/29/2014 0903   K 3.8 11/04/2013 0454   CL 108 11/04/2013 0454   CL 105 04/17/2013 1323   CO2 25 01/29/2014 0903   CO2 23 11/04/2013 0454   BUN 16.0 01/29/2014 0903   BUN 8 11/04/2013 0454   CREATININE 0.7 01/29/2014 0903   CREATININE 0.43* 11/04/2013 0454      Component Value Date/Time   CALCIUM 9.0 01/29/2014 0903   CALCIUM 7.3* 11/04/2013 0454   ALKPHOS 120 01/29/2014 0903   ALKPHOS 224* 11/03/2013 0548   AST 38* 01/29/2014 0903   AST 31 11/03/2013 0548   ALT 13 01/29/2014 0903   ALT 13 11/03/2013 0548  BILITOT 0.56 01/29/2014 0903   BILITOT 0.4 11/03/2013 0548    REPORT OF SURGICAL PATHOLOGY  Case #: [id number] Patient Name: Gina, [name]. Office Chart Number: N/A  MRN: [id number] Pathologist: Violet Baldy, MD DOB/Age 22-Aug-1946 (Age: 50) Gender: F Date Taken: 02/07/2007 Date Received: 02/07/2007  FINAL DIAGNOSIS  MICROSCOPIC EXAMINATION AND DIAGNOSIS  1. RIGHT AXILLARY SENTINEL LYMPH NODE, EXCISION: - METASTATIC CARCINOMA CONSISTENT WITH BREAST PRIMARY, SEE COMMENT.  2. RIGHT BREAST, NEEDLE LOCALIZATION BIOPSY: - RESIDUAL CARCINOMA IN TUMOR REGRESSION AREA. - CARCINOMA IS CLOSE TO MEDIAL AND LATERAL SURGICAL MARGINS OF RESECTION. - SEE ONCOLOGY TABLE  COMMENT 1. The sections show metastatic carcinoma on H The tumor is seen in the form of dispersed atypical epithelioid cells in a fibrotic background involving an area more than 0.2 cm consistent with metastatic carcinoma. No extranodal extension is identified. For confirmation, Cytokeratin stain (AE1/AE3) was performed and is positive. The control stained appropriately.  ONCOLOGY TABLE-BREAST, INCISIONAL/EXCISIONAL  BIOPSY OR MASTECTOMY WITH LYMPH NODES  1. Maximum tumor size (cm): Dispersed tumor cells in a tumor regression area measuring 1.8 cm, see comment 2. Margins: Tumor cells are seen less than 0.1 cm from the surgical margins of resection Invasive component, distance to closest margin: Less than 0.1 cm In situ component, distance to closest margin: N/A 3. Vascular/Lymphatic invasion: Yes 4. Histology, invasive component: Invasive mammary carcinoma consistent with lobular carcinoma 5. Grade, invasive component (Elston-Ellis modified Scarff-Bloom-Richardson): II Tubule formation grade: 3 Nuclear pleomorphism grade: 2 Mitotic grade: 1 6. Extensive intraductal component (JCRT): No 7. Histology, in situ component: N/A 8. Grade, in situ component: N/A 9. Multicentric (separate tumors in different quadrants): Unknown 10. Multifocal (separate tumors in same quadrant or biopsy): No 11. Axillary lymph nodes: #examined: 1 #with metastasis: 1 ITC (isolated tumor cells, < 0.43m): 0 Micrometastasis (> 0.264m < 66m78m 0 Metastasis > 2 mm: 1 Extracapsular extension: No 12. TNM Code: ypT1, pN1a, pMX 13. Breast Prognostic Markers: Case number [id number] receptor positive (100%) Progesterone receptor positive (3%) Ki 67 (Mib-1) 12% Her 2 neu (HercepTest) 2+ Her 2 neu by FISH N/A 14. Non-neoplastic breast: Fibrosis 15. Comments: Sections of the localized tissue show a fibrotic area measuring 1.8 cm and displaying dispersed atypical epithelioid cells throughout that area. Some of the cells surround the benign appearing ductal elements. The findings are subtle and most compatible with residual carcinoma. For confirmation cytokeratin stains were performed on several blocks representing the localized tissue and show strong positivity in previously described epithelioid cells confirming the morphologic impression of residual carcinoma. The carcinoma is less than 0.1 cm from the medial  surgical margin of resection and approximately 0.1 cm from the lateral surgical margin of resection. Since the findings are subtle and the tumor is essentially composed of dispersed cells throughout, the status of the medial and lateral margins in particular may be questionable and hence truly negative margins cannot be assured. I strongly recommend clinical correlation and followup. (BNS:caf 02/12/07)  RADIOGRAPHIC STUDIES:  No results found.  ASSESSMENT/PLAN: [age]ar old female with  #1 history of stage II invasive mammary carcinoma with lobular features originally diagnosed in 2007. She underwent neoadjuvant chemotherapy consisting of 4 cycles of dose dense FEC followed by dose dense Taxotere for 4 cycles. She then had a lumpectomy that revealed residual disease. She proceeded on to have a mastectomy. She was not a candidate for radiation therapy even though she did have an evaluation by radiation oncology.  #2 status post Femara 2.5 mg daily she overall  tolerated it well. She completed 5 years of Femara. She discontinued this on 04/28/2013.  #3 Anemia work up started on 08/05/13.  After CT chest abd pelvis, PET scan, and bone marrow biopsy, patient was found to have metastatic breast cancer to the bone.  She will receive Xgeva every four weeks and take Arimidex daily.    #4 palliative chemotherapy: Patient is currently on Taxol weekly. I did dose attenuate her to 60 mg per meter squared. She will continue to do on a weekly basis. Her platelets are 29,000 patient is aware. Marland Kitchen She will receive cycle #11 of Taxol today. Patient is aware that her counts are low as far as the platelets are concerned. Hemoglobin hematocrit are low but white count is normal with an ANC of 2.5.  #5 malnutrition: Patient has gained a few more pounds which is significantly better. She is eating.  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 Today platelets are  only 25,000.   #7 chronic pain: Patient tells me that she is not on pain medications. Her pain is very well-controlled. She is able to do activities of daily living  #8 bone metastasis: Patient is receiving Xgev and tolerating it well. She understands risks and benefits of this. This is being given on a monthly basis.  #9 Anemia: likely secondary to chemotherapy and underlying breast cancer. Continue to observe and transfuse as needed. 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  #10 patient will return in one week's time for cycle 12 of taxol. We will give her taxol if platelets remain above 25,000 or higher and she has no evidence of bleeding or bruising.  All questions were answered. The patient knows to call the clinic with any problems, questions or concerns. We can certainly see the patient much sooner if necessary.  I spent 20 minutes counseling the patient face to face. The total time spent in the appointment was 30 minutes.    Marcy Panning, MD Medical/Oncology [facility] [phone] (beeper) [phone] (Office)

## 2014-02-05 ENCOUNTER — Ambulatory Visit (HOSPITAL_BASED_OUTPATIENT_CLINIC_OR_DEPARTMENT_OTHER): Payer: 59

## 2014-02-05 ENCOUNTER — Ambulatory Visit (HOSPITAL_BASED_OUTPATIENT_CLINIC_OR_DEPARTMENT_OTHER): Payer: 59 | Admitting: Oncology

## 2014-02-05 ENCOUNTER — Encounter: Payer: Self-pay | Admitting: Oncology

## 2014-02-05 ENCOUNTER — Other Ambulatory Visit (HOSPITAL_BASED_OUTPATIENT_CLINIC_OR_DEPARTMENT_OTHER): Payer: 59

## 2014-02-05 VITALS — BP 148/76 | HR 128 | Temp 97.9°F | Resp 20 | Ht 63.0 in | Wt 106.7 lb

## 2014-02-05 DIAGNOSIS — D649 Anemia, unspecified: Secondary | ICD-10-CM

## 2014-02-05 DIAGNOSIS — C7951 Secondary malignant neoplasm of bone: Secondary | ICD-10-CM

## 2014-02-05 DIAGNOSIS — D696 Thrombocytopenia, unspecified: Secondary | ICD-10-CM

## 2014-02-05 DIAGNOSIS — C50119 Malignant neoplasm of central portion of unspecified female breast: Secondary | ICD-10-CM

## 2014-02-05 DIAGNOSIS — Z5111 Encounter for antineoplastic chemotherapy: Secondary | ICD-10-CM

## 2014-02-05 DIAGNOSIS — C7952 Secondary malignant neoplasm of bone marrow: Secondary | ICD-10-CM

## 2014-02-05 DIAGNOSIS — C50919 Malignant neoplasm of unspecified site of unspecified female breast: Secondary | ICD-10-CM

## 2014-02-05 LAB — CBC WITH DIFFERENTIAL/PLATELET
HEMATOCRIT: 30 % — AB (ref 34.8–46.6)
HGB: 9.3 g/dL — ABNORMAL LOW (ref 11.6–15.9)
MCH: 28.4 pg (ref 25.1–34.0)
MCHC: 31 g/dL — ABNORMAL LOW (ref 31.5–36.0)
MCV: 91.5 fL (ref 79.5–101.0)
Platelets: 24 10*3/uL — ABNORMAL LOW (ref 145–400)
RBC: 3.28 10*6/uL — ABNORMAL LOW (ref 3.70–5.45)
RDW: 19.2 % — AB (ref 11.2–14.5)
WBC: 6.7 10*3/uL (ref 3.9–10.3)

## 2014-02-05 LAB — MANUAL DIFFERENTIAL
ALC: 1.3 10e3/uL (ref 0.9–3.3)
ANC (CHCC manual diff): 3.1 10e3/uL (ref 1.5–6.5)
Band Neutrophils: 13 % — ABNORMAL HIGH (ref 0–10)
Basophil: 4 % — ABNORMAL HIGH (ref 0–2)
Blasts: 3 % — ABNORMAL HIGH (ref 0–0)
EOS: 7 % (ref 0–7)
LYMPH: 19 % (ref 14–49)
MONO: 20 % — ABNORMAL HIGH (ref 0–14)
Metamyelocytes: 3 % — ABNORMAL HIGH (ref 0–0)
Myelocytes: 16 % — ABNORMAL HIGH (ref 0–0)
Other Cell: 0 % (ref 0–0)
PLT EST: DECREASED
PROMYELO: 1 % — ABNORMAL HIGH (ref 0–0)
SEG: 14 % — ABNORMAL LOW (ref 38–77)
Variant Lymph: 0 % (ref 0–0)
nRBC: 37 % — ABNORMAL HIGH (ref 0–0)

## 2014-02-05 LAB — COMPREHENSIVE METABOLIC PANEL (CC13)
ALT: 14 U/L (ref 0–55)
AST: 36 U/L — ABNORMAL HIGH (ref 5–34)
Albumin: 3.4 g/dL — ABNORMAL LOW (ref 3.5–5.0)
Alkaline Phosphatase: 107 U/L (ref 40–150)
Anion Gap: 11 meq/L (ref 3–11)
BUN: 18.5 mg/dL (ref 7.0–26.0)
CO2: 25 meq/L (ref 22–29)
Calcium: 9.7 mg/dL (ref 8.4–10.4)
Chloride: 104 meq/L (ref 98–109)
Creatinine: 0.7 mg/dL (ref 0.6–1.1)
Glucose: 112 mg/dL (ref 70–140)
Potassium: 4.2 meq/L (ref 3.5–5.1)
Sodium: 140 meq/L (ref 136–145)
Total Bilirubin: 0.48 mg/dL (ref 0.20–1.20)
Total Protein: 6.9 g/dL (ref 6.4–8.3)

## 2014-02-05 MED ORDER — HEPARIN SOD (PORK) LOCK FLUSH 100 UNIT/ML IV SOLN
500.0000 [IU] | Freq: Once | INTRAVENOUS | Status: AC | PRN
  Administered 2014-02-05: 500 [IU]
  Filled 2014-02-05: qty 5

## 2014-02-05 MED ORDER — ONDANSETRON 8 MG/NS 50 ML IVPB
INTRAVENOUS | Status: AC
  Filled 2014-02-05: qty 8

## 2014-02-05 MED ORDER — FAMOTIDINE IN NACL 20-0.9 MG/50ML-% IV SOLN
INTRAVENOUS | Status: AC
  Filled 2014-02-05: qty 50

## 2014-02-05 MED ORDER — SODIUM CHLORIDE 0.9 % IV SOLN
60.0000 mg/m2 | Freq: Once | INTRAVENOUS | Status: AC
  Administered 2014-02-05: 84 mg via INTRAVENOUS
  Filled 2014-02-05: qty 14

## 2014-02-05 MED ORDER — DEXAMETHASONE SODIUM PHOSPHATE 20 MG/5ML IJ SOLN
INTRAMUSCULAR | Status: AC
  Filled 2014-02-05: qty 5

## 2014-02-05 MED ORDER — DEXAMETHASONE SODIUM PHOSPHATE 20 MG/5ML IJ SOLN
20.0000 mg | Freq: Once | INTRAMUSCULAR | Status: AC
  Administered 2014-02-05: 20 mg via INTRAVENOUS

## 2014-02-05 MED ORDER — FAMOTIDINE IN NACL 20-0.9 MG/50ML-% IV SOLN
20.0000 mg | Freq: Once | INTRAVENOUS | Status: AC
  Administered 2014-02-05: 20 mg via INTRAVENOUS

## 2014-02-05 MED ORDER — DIPHENHYDRAMINE HCL 50 MG/ML IJ SOLN
25.0000 mg | Freq: Once | INTRAMUSCULAR | Status: AC
  Administered 2014-02-05: 25 mg via INTRAVENOUS

## 2014-02-05 MED ORDER — SODIUM CHLORIDE 0.9 % IJ SOLN
10.0000 mL | INTRAMUSCULAR | Status: DC | PRN
  Administered 2014-02-05: 10 mL
  Filled 2014-02-05: qty 10

## 2014-02-05 MED ORDER — DIPHENHYDRAMINE HCL 50 MG/ML IJ SOLN
INTRAMUSCULAR | Status: AC
  Filled 2014-02-05: qty 1

## 2014-02-05 MED ORDER — SODIUM CHLORIDE 0.9 % IV SOLN
Freq: Once | INTRAVENOUS | Status: AC
  Administered 2014-02-05: 09:00:00 via INTRAVENOUS

## 2014-02-05 MED ORDER — ONDANSETRON 8 MG/50ML IVPB (CHCC)
8.0000 mg | Freq: Once | INTRAVENOUS | Status: AC
  Administered 2014-02-05: 8 mg via INTRAVENOUS

## 2014-02-05 NOTE — Progress Notes (Signed)
Per Dr. Humphrey Rolls, okay to tx patient with platelets-24

## 2014-02-05 NOTE — Progress Notes (Signed)
OK to treat with lab values of 3/13 per Dr. Humphrey Rolls.

## 2014-02-05 NOTE — Telephone Encounter (Signed)
sw pt gv appt for 08/11/14 w/labs@ 11:30am and ov@ 12 noon. Pt is aware...td

## 2014-02-05 NOTE — Patient Instructions (Signed)
[facility] Discharge Instructions for Patients Receiving Chemotherapy  Today you received the following chemotherapy agents: Taxol  To help prevent nausea and vomiting after your treatment, we encourage you to take your nausea medication as prescribed by your physician.  If you develop nausea and vomiting that is not controlled by your nausea medication, call the clinic.   BELOW ARE SYMPTOMS THAT SHOULD BE REPORTED IMMEDIATELY:  *FEVER GREATER THAN 100.5 F  *CHILLS WITH OR WITHOUT FEVER  NAUSEA AND VOMITING THAT IS NOT CONTROLLED WITH YOUR NAUSEA MEDICATION  *UNUSUAL SHORTNESS OF BREATH  *UNUSUAL BRUISING OR BLEEDING  TENDERNESS IN MOUTH AND THROAT WITH OR WITHOUT PRESENCE OF ULCERS  *URINARY PROBLEMS  *BOWEL PROBLEMS  UNUSUAL RASH Items with * indicate a potential emergency and should be followed up as soon as possible.  Feel free to call the clinic you have any questions or concerns. The clinic phone number is [phone].    

## 2014-02-10 ENCOUNTER — Encounter: Payer: Self-pay | Admitting: Oncology

## 2014-02-10 NOTE — Progress Notes (Signed)
OFFICE PROGRESS NOTE  CC  [name], MD [address], [address] [place] [address] Dr. 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 Diagnosed in October 2007 as T1N1  PRIOR THERAPY:  #1 patient presented with a mass in her right breast in October 2007. She underwent bilateral mammograms. The right breast ultrasound performed on 08/19/2006 showed increased density and distortion in the subareolar location of the right breast. Ultrasound showed abnormal echoes in the subareolar location which was a change measuring 1.5 cm. Patient underwent an ultrasound-guided biopsy on 08/23/2006 which showed invasive mammary carcinoma with lobular features. ER +100% PR 3% proliferation marker Ki-67 12% HER-2/neu 2+ by fish. MRI of the breasts on 08/30/2006 showed an ill-defined enhancing mass in the right breast measuring 2.6 x 4.0 x 4.1 cm. Enhancement trailed out to the nipple but did not involve the nipple. There was also a small enhancing internal mammary node measuring 7 mm suspicious for metastatic disease. Left breast was negative.  #2 patient was seen by Dr. Eston Esters on 09/09/2006. She received neoadjuvant chemotherapy initially consisting of FEC regimen given dose dense for 4 cycles followed by Taxotere. She received her chemotherapy she received her chemotherapy from 09/20/2006 through February 2008. She then went on to lumpectomy on 02/07/2007. However there was essentially residual tumor dispersed over an area 1.8 cm with some close surgical margins at the medial area. Her case was reviewed at the breast conference and it was suggested she undergo mastectomy. Patient underwent a mastectomy on 03/19/2007.  #3 she was subsequently seen by radiation oncology for consideration of post mastectomy radiation therapy. However, she was not given radiation.  #4 she was then begun on  Femara 2.5 mg daily. She has now completed 5 years of therapy. It was discontinued in June 2014.    #5 Returned to clinic on 08/05/13 for evaluation and management of new anemia.  She has underwent PET/CT chest abdomen and pelvis which reveals diffuse lytic lesions throughout the axial and appendicular skeleton.  Bone marrow biopsy was consistent with metastatic ER/PR positive breast cancer.    #6 status post Arimidex    #7 palliative single agent Taxol being given weekly until patient has progression of disease  CURRENT THERAPY: Proceed with cycle 12 of dose attenuated Taxol  INTERVAL HISTORY: ANALICIA [name] 68 y.o. female returns for follow up. She tolerated cycle 11 of her chemotherapy very well. She has not had any bleeding or bruising. She again has gained some weight. She tells me that she is eating quite well and as always starving. She denies any epistaxis easy bruising bleeding hematuria hematochezia or melena. She has no nausea or vomiting no headaches double vision. She is very anxious to get her chemotherapy. Her husband is very concerned that if we do not give her chemotherapy then she may not do well. They understand that giving her chemotherapy with such low counts may cause her arm. However on the other hand she has tolerated the Taxol quite nicely. She is denying any peripheral paresthesias tingling or numbness. [name] is negative.   MEDICAL HISTORY: Past Medical History  Diagnosis Date  . Breast cancer 04/28/2013  . Myalgia and myositis, unspecified   . Anemia     ALLERGIES:  is allergic to sudafed.  MEDICATIONS:  Current Outpatient Prescriptions  Medication Sig Dispense Refill  . cetirizine (ZYRTEC) 10 MG  tablet Take 10 mg by mouth daily.        . Cholecalciferol (VITAMIN D3) 5000 UNITS TABS Take 5,000 mg by mouth daily.      Marland Kitchen dronabinol (MARINOL) 2.5 MG capsule Take 1 capsule (2.5 mg total) by mouth 2 (two) times daily before a meal.   60 capsule  1  . HYDROcodone-acetaminophen (NORCO/VICODIN) 5-325 MG per tablet Take 1 tablet by mouth every 6 (six) hours as needed for moderate pain.  30 tablet  0  . lidocaine-prilocaine (EMLA) cream Apply 1 application topically as needed.  30 g  1  . [place] Mushroom POWD 5 mLs by Does not apply route daily.      . meclizine (ANTIVERT) 25 MG tablet Take 1 tablet (25 mg total) by mouth 3 (three) times daily as needed for dizziness.  30 tablet  0  . mirtazapine (REMERON) 15 MG tablet       . Multiple Vitamins-Minerals (OSTEO COMPLEX PO) Take by mouth.      Marland Kitchen omeprazole (PRILOSEC) 40 MG capsule Take 40 mg by mouth daily.      . ondansetron (ZOFRAN) 4 MG tablet Take 1 tablet (4 mg total) by mouth every 6 (six) hours as needed for nausea.  20 tablet  0  . traMADol (ULTRAM) 50 MG tablet Take 0.5-1 tablets (25-50 mg total) by mouth every 6 (six) hours as needed for pain.  120 tablet  0  . venlafaxine XR (EFFEXOR-XR) 37.5 MG 24 hr capsule Take 1 capsule (37.5 mg total) by mouth daily with breakfast.  30 capsule  9   No current facility-administered medications for this visit.    SURGICAL HISTORY:  Past Surgical History  Procedure Laterality Date  . Mastectomy Right   . Tubal ligation  1971    REVIEW OF SYSTEMS:  A 10 point review of systems was conducted and is otherwise negative except for what is noted above.    PHYSICAL EXAMINATION: Blood pressure 148/76, pulse 128, temperature 97.9 F (36.6 C), temperature source Oral, resp. rate 20, height 5' 3"  (1.6 m), weight 106 lb 11.2 oz (48.399 kg). Body mass index is 18.91 kg/(m^2). General: Patient is a well appearing female, in wheelchair, improved from last exam, weight improved by 4 lbs HEENT: PERRLA, sclerae anicteric, conjunctival pallor, MMM Neck: supple, no palpable adenopathy Lungs: clear to auscultation bilaterally, no wheezes, rhonchi, or rales Cardiovascular: regular rate rhythm, S1, S2, no murmurs, rubs or gallops Abdomen: Soft,  non-tender, non-distended, normoactive bowel sounds, no HSM Extremities: warm and well perfused, no clubbing, cyanosis, or edema Skin:  No rashes or lesions Neuro: Non-focal Breasts: deferred ECOG PERFORMANCE STATUS: 0 - Asymptomatic  LABORATORY DATA: Lab Results  Component Value Date   WBC 6.7 02/05/2014   HGB 9.3* 02/05/2014   HCT 30.0* 02/05/2014   MCV 91.5 02/05/2014   PLT 24* 02/05/2014      Chemistry      Component Value Date/Time   NA 140 02/05/2014 0844   NA 140 11/04/2013 0454   K 4.2 02/05/2014 0844   K 3.8 11/04/2013 0454   CL 108 11/04/2013 0454   CL 105 04/17/2013 1323   CO2 25 02/05/2014 0844   CO2 23 11/04/2013 0454   BUN 18.5 02/05/2014 0844   BUN 8 11/04/2013 0454   CREATININE 0.7 02/05/2014 0844   CREATININE 0.43* 11/04/2013 0454      Component Value Date/Time   CALCIUM 9.7 02/05/2014 0844   CALCIUM 7.3* 11/04/2013 0454   ALKPHOS 107  02/05/2014 0844   ALKPHOS 224* 11/03/2013 0548   AST 36* 02/05/2014 0844   AST 31 11/03/2013 0548   ALT 14 02/05/2014 0844   ALT 13 11/03/2013 0548   BILITOT 0.48 02/05/2014 0844   BILITOT 0.4 11/03/2013 0548    REPORT OF SURGICAL PATHOLOGY  Case #: [id number] Patient Name: Gina [name], ALKINS. Office Chart Number: N/A  MRN: [id number] Pathologist: Violet Baldy, MD DOB/Age [birth year] (Age: 66) Gender: F Date Taken: 02/07/2007 Date Received: 02/07/2007  FINAL DIAGNOSIS  MICROSCOPIC EXAMINATION AND DIAGNOSIS  1. RIGHT AXILLARY SENTINEL LYMPH NODE, EXCISION: - METASTATIC CARCINOMA CONSISTENT WITH BREAST PRIMARY, SEE COMMENT.  2. RIGHT BREAST, NEEDLE LOCALIZATION BIOPSY: - RESIDUAL CARCINOMA IN TUMOR REGRESSION AREA. - CARCINOMA IS CLOSE TO MEDIAL AND LATERAL SURGICAL MARGINS OF RESECTION. - SEE ONCOLOGY TABLE  COMMENT 1. The sections show metastatic carcinoma on H The tumor is seen in the form of dispersed atypical epithelioid cells in a fibrotic background involving an area more than 0.2 cm consistent with metastatic carcinoma.  No extranodal extension is identified. For confirmation, Cytokeratin stain (AE1/AE3) was performed and is positive. The control stained appropriately.  ONCOLOGY TABLE-BREAST, INCISIONAL/EXCISIONAL BIOPSY OR MASTECTOMY WITH LYMPH NODES  1. Maximum tumor size (cm): Dispersed tumor cells in a tumor regression area measuring 1.8 cm, see comment 2. Margins: Tumor cells are seen less than 0.1 cm from the surgical margins of resection Invasive component, distance to closest margin: Less than 0.1 cm In situ component, distance to closest margin: N/A 3. Vascular/Lymphatic invasion: Yes 4. Histology, invasive component: Invasive mammary carcinoma consistent with lobular carcinoma 5. Grade, invasive component (Elston-Ellis modified Scarff-Bloom-Richardson): II Tubule formation grade: 3 Nuclear pleomorphism grade: 2 Mitotic grade: 1 6. Extensive intraductal component (JCRT): No 7. Histology, in situ component: N/A 8. Grade, in situ component: N/A 9. Multicentric (separate tumors in different quadrants): Unknown 10. Multifocal (separate tumors in same quadrant or biopsy): No 11. Axillary lymph nodes: #examined: 1 #with metastasis: 1 ITC (isolated tumor cells, < 0.75m): 0 Micrometastasis (> 0.243m < 55m18m 0 Metastasis > 2 mm: 1 Extracapsular extension: No 12. TNM Code: ypT1, pN1a, pMX 13. Breast Prognostic Markers: Case number [id number] receptor positive (100%) Progesterone receptor positive (3%) Ki 67 (Mib-1) 12% Her 2 neu (HercepTest) 2+ Her 2 neu by FISH N/A 14. Non-neoplastic breast: Fibrosis 15. Comments: Sections of the localized tissue show a fibrotic area measuring 1.8 cm and displaying dispersed atypical epithelioid cells throughout that area. Some of the cells surround the benign appearing ductal elements. The findings are subtle and most compatible with residual carcinoma. For confirmation cytokeratin stains were performed on several blocks representing the  localized tissue and show strong positivity in previously described epithelioid cells confirming the morphologic impression of residual carcinoma. The carcinoma is less than 0.1 cm from the medial surgical margin of resection and approximately 0.1 cm from the lateral surgical margin of resection. Since the findings are subtle and the tumor is essentially composed of dispersed cells throughout, the status of the medial and lateral margins in particular may be questionable and hence truly negative margins cannot be assured. I strongly recommend clinical correlation and followup. (BNS:caf 02/12/07)  RADIOGRAPHIC STUDIES:  No results found.  ASSESSMENT/PLAN: [age]ar old female with  #1 history of stage II invasive mammary carcinoma with lobular features originally diagnosed in 2007. She underwent neoadjuvant chemotherapy consisting of 4 cycles of dose dense FEC followed by dose dense Taxotere for 4 cycles. She then had a lumpectomy that revealed residual disease. She  proceeded on to have a mastectomy. She was not a candidate for radiation therapy even though she did have an evaluation by radiation oncology.  #2 status post Femara 2.5 mg daily she overall tolerated it well. She completed 5 years of Femara. She discontinued this on 04/28/2013.  #3 Anemia work up started on 08/05/13.  After CT chest abd pelvis, PET scan, and bone marrow biopsy, patient was found to have metastatic breast cancer to the bone.  She will receive Xgeva every four weeks and take Arimidex daily.    #4 palliative chemotherapy: Patient is currently on Taxol weekly. I did dose attenuate her to 60 mg per meter squared. She will continue to do on a weekly basis. Her platelets are 24,000 patient is aware. Marland Kitchen She will receive cycle #12 of Taxol today. Patient is aware that her counts are low as far as the platelets are concerned. Hemoglobin hematocrit are low but white count is normal with an ANC of 3.1.  #5 malnutrition: Patient  has gained a few more pounds which is significantly better. She is eating.  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 Today platelets are only 24,000.   #7 chronic pain: Patient tells me that she is not on pain medications. Her pain is very well-controlled. She is able to do activities of daily living  #8 bone metastasis: Patient is receiving Xgev and tolerating it well. She understands risks and benefits of this. This is being given on a monthly basis.  #9 Anemia: likely secondary to chemotherapy and underlying breast cancer. Continue to observe and transfuse as needed. 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  #10 patient will return in one week's time for cycle 13 of taxol.   All questions were answered. The patient knows to call the clinic with any problems, questions or concerns. We can certainly see the patient much sooner if necessary.  I spent 20 minutes counseling the patient face to face. The total time spent in the appointment was 30 minutes.    Marcy Panning, MD Medical/Oncology [facility] [phone] (beeper) [phone] (Office)

## 2014-02-12 ENCOUNTER — Other Ambulatory Visit (HOSPITAL_BASED_OUTPATIENT_CLINIC_OR_DEPARTMENT_OTHER): Payer: 59

## 2014-02-12 ENCOUNTER — Ambulatory Visit (HOSPITAL_BASED_OUTPATIENT_CLINIC_OR_DEPARTMENT_OTHER): Payer: 59 | Admitting: Oncology

## 2014-02-12 ENCOUNTER — Encounter: Payer: Self-pay | Admitting: Oncology

## 2014-02-12 ENCOUNTER — Ambulatory Visit (HOSPITAL_BASED_OUTPATIENT_CLINIC_OR_DEPARTMENT_OTHER): Payer: 59

## 2014-02-12 VITALS — BP 131/84 | HR 99 | Temp 98.2°F | Resp 18 | Ht 63.0 in | Wt 108.4 lb

## 2014-02-12 DIAGNOSIS — C7952 Secondary malignant neoplasm of bone marrow: Secondary | ICD-10-CM

## 2014-02-12 DIAGNOSIS — C7951 Secondary malignant neoplasm of bone: Secondary | ICD-10-CM

## 2014-02-12 DIAGNOSIS — D696 Thrombocytopenia, unspecified: Secondary | ICD-10-CM

## 2014-02-12 DIAGNOSIS — C50119 Malignant neoplasm of central portion of unspecified female breast: Secondary | ICD-10-CM

## 2014-02-12 DIAGNOSIS — C50919 Malignant neoplasm of unspecified site of unspecified female breast: Secondary | ICD-10-CM

## 2014-02-12 DIAGNOSIS — G894 Chronic pain syndrome: Secondary | ICD-10-CM

## 2014-02-12 DIAGNOSIS — Z5111 Encounter for antineoplastic chemotherapy: Secondary | ICD-10-CM

## 2014-02-12 DIAGNOSIS — D649 Anemia, unspecified: Secondary | ICD-10-CM

## 2014-02-12 LAB — COMPREHENSIVE METABOLIC PANEL (CC13)
ALBUMIN: 3.5 g/dL (ref 3.5–5.0)
ALT: 16 U/L (ref 0–55)
AST: 39 U/L — ABNORMAL HIGH (ref 5–34)
Alkaline Phosphatase: 99 U/L (ref 40–150)
Anion Gap: 13 mEq/L — ABNORMAL HIGH (ref 3–11)
BUN: 18.3 mg/dL (ref 7.0–26.0)
CALCIUM: 9.7 mg/dL (ref 8.4–10.4)
CHLORIDE: 104 meq/L (ref 98–109)
CO2: 25 meq/L (ref 22–29)
Creatinine: 0.7 mg/dL (ref 0.6–1.1)
GLUCOSE: 106 mg/dL (ref 70–140)
POTASSIUM: 4.7 meq/L (ref 3.5–5.1)
Sodium: 142 mEq/L (ref 136–145)
TOTAL PROTEIN: 6.9 g/dL (ref 6.4–8.3)
Total Bilirubin: 0.59 mg/dL (ref 0.20–1.20)

## 2014-02-12 LAB — MANUAL DIFFERENTIAL
ALC: 2.5 10*3/uL (ref 0.9–3.3)
ANC (CHCC MAN DIFF): 2.2 10*3/uL (ref 1.5–6.5)
BASOPHIL: 7 % — AB (ref 0–2)
Band Neutrophils: 8 % (ref 0–10)
Blasts: 4 % — ABNORMAL HIGH (ref 0–0)
EOS: 2 % (ref 0–7)
LYMPH: 37 % (ref 14–49)
METAMYELOCYTES PCT: 6 % — AB (ref 0–0)
MONO: 16 % — ABNORMAL HIGH (ref 0–14)
MYELOCYTES: 13 % — AB (ref 0–0)
NRBC: 54 % — AB (ref 0–0)
PLT EST: DECREASED
PROMYELO: 1 % — ABNORMAL HIGH (ref 0–0)
SEG: 6 % — ABNORMAL LOW (ref 38–77)

## 2014-02-12 LAB — CBC WITH DIFFERENTIAL/PLATELET
HEMATOCRIT: 30.2 % — AB (ref 34.8–46.6)
HGB: 9.5 g/dL — ABNORMAL LOW (ref 11.6–15.9)
MCH: 28.6 pg (ref 25.1–34.0)
MCHC: 31.5 g/dL (ref 31.5–36.0)
MCV: 91 fL (ref 79.5–101.0)
PLATELETS: 28 10*3/uL — AB (ref 145–400)
RBC: 3.32 10*6/uL — ABNORMAL LOW (ref 3.70–5.45)
RDW: 19.4 % — ABNORMAL HIGH (ref 11.2–14.5)
WBC: 6.8 10*3/uL (ref 3.9–10.3)

## 2014-02-12 MED ORDER — FAMOTIDINE IN NACL 20-0.9 MG/50ML-% IV SOLN
20.0000 mg | Freq: Once | INTRAVENOUS | Status: AC
  Administered 2014-02-12: 20 mg via INTRAVENOUS

## 2014-02-12 MED ORDER — DIPHENHYDRAMINE HCL 50 MG/ML IJ SOLN
INTRAMUSCULAR | Status: AC
  Filled 2014-02-12: qty 1

## 2014-02-12 MED ORDER — ONDANSETRON 8 MG/NS 50 ML IVPB
INTRAVENOUS | Status: AC
  Filled 2014-02-12: qty 8

## 2014-02-12 MED ORDER — SODIUM CHLORIDE 0.9 % IV SOLN
Freq: Once | INTRAVENOUS | Status: AC
  Administered 2014-02-12: 10:00:00 via INTRAVENOUS

## 2014-02-12 MED ORDER — DEXAMETHASONE SODIUM PHOSPHATE 20 MG/5ML IJ SOLN
INTRAMUSCULAR | Status: AC
  Filled 2014-02-12: qty 5

## 2014-02-12 MED ORDER — SODIUM CHLORIDE 0.9 % IV SOLN
60.0000 mg/m2 | Freq: Once | INTRAVENOUS | Status: AC
  Administered 2014-02-12: 84 mg via INTRAVENOUS
  Filled 2014-02-12: qty 14

## 2014-02-12 MED ORDER — HEPARIN SOD (PORK) LOCK FLUSH 100 UNIT/ML IV SOLN
500.0000 [IU] | Freq: Once | INTRAVENOUS | Status: AC | PRN
  Administered 2014-02-12: 500 [IU]
  Filled 2014-02-12: qty 5

## 2014-02-12 MED ORDER — DIPHENHYDRAMINE HCL 50 MG/ML IJ SOLN
25.0000 mg | Freq: Once | INTRAMUSCULAR | Status: AC
  Administered 2014-02-12: 25 mg via INTRAVENOUS

## 2014-02-12 MED ORDER — SODIUM CHLORIDE 0.9 % IJ SOLN
10.0000 mL | INTRAMUSCULAR | Status: DC | PRN
  Administered 2014-02-12: 10 mL
  Filled 2014-02-12: qty 10

## 2014-02-12 MED ORDER — FAMOTIDINE IN NACL 20-0.9 MG/50ML-% IV SOLN
INTRAVENOUS | Status: AC
  Filled 2014-02-12: qty 50

## 2014-02-12 MED ORDER — DEXAMETHASONE SODIUM PHOSPHATE 20 MG/5ML IJ SOLN
20.0000 mg | Freq: Once | INTRAMUSCULAR | Status: AC
  Administered 2014-02-12: 20 mg via INTRAVENOUS

## 2014-02-12 MED ORDER — ONDANSETRON 8 MG/50ML IVPB (CHCC)
8.0000 mg | Freq: Once | INTRAVENOUS | Status: AC
  Administered 2014-02-12: 8 mg via INTRAVENOUS

## 2014-02-12 NOTE — Patient Instructions (Signed)
[facility] Discharge Instructions for Patients Receiving Chemotherapy  Today you received the following chemotherapy agents:  Taxol  To help prevent nausea and vomiting after your treatment, we encourage you to take your nausea medication as ordered per MD.   If you develop nausea and vomiting that is not controlled by your nausea medication, call the clinic.   BELOW ARE SYMPTOMS THAT SHOULD BE REPORTED IMMEDIATELY:  *FEVER GREATER THAN 100.5 F  *CHILLS WITH OR WITHOUT FEVER  NAUSEA AND VOMITING THAT IS NOT CONTROLLED WITH YOUR NAUSEA MEDICATION  *UNUSUAL SHORTNESS OF BREATH  *UNUSUAL BRUISING OR BLEEDING  TENDERNESS IN MOUTH AND THROAT WITH OR WITHOUT PRESENCE OF ULCERS  *URINARY PROBLEMS  *BOWEL PROBLEMS  UNUSUAL RASH Items with * indicate a potential emergency and should be followed up as soon as possible.  Feel free to call the clinic you have any questions or concerns. The clinic phone number is [phone].    

## 2014-02-12 NOTE — Progress Notes (Signed)
0934-OK to treat with platelet count of 28 per Dr. Humphrey Rolls.

## 2014-02-12 NOTE — Progress Notes (Signed)
[name] PROGRESS NOTE  Patient Care Team: [name], MD as PCP - General (Family Medicine) Dr. 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 Diagnosed in October 2007 as T1N1   SUMMARY OF ONCOLOGIC HISTORY: #1 patient presented with a mass in her right breast in October 2007. She underwent bilateral mammograms. The right breast ultrasound performed on 08/19/2006 showed increased density and distortion in the subareolar location of the right breast. Ultrasound showed abnormal echoes in the subareolar location which was a change measuring 1.5 cm. Patient underwent an ultrasound-guided biopsy on 08/23/2006 which showed invasive mammary carcinoma with lobular features. ER +100% PR 3% proliferation marker Ki-67 12% HER-2/neu 2+ by fish. MRI of the breasts on 08/30/2006 showed an ill-defined enhancing mass in the right breast measuring 2.6 x 4.0 x 4.1 cm. Enhancement trailed out to the nipple but did not involve the nipple. There was also a small enhancing internal mammary node measuring 7 mm suspicious for metastatic disease. Left breast was negative.  #2 patient was seen by Dr. Eston Esters on 09/09/2006. She received neoadjuvant chemotherapy initially consisting of FEC regimen given dose dense for 4 cycles followed by Taxotere. She received her chemotherapy she received her chemotherapy from 09/20/2006 through February 2008. She then went on to lumpectomy on 02/07/2007. However there was essentially residual tumor dispersed over an area 1.8 cm with some close surgical margins at the medial area. Her case was reviewed at the breast conference and it was suggested she undergo mastectomy. Patient underwent a mastectomy on 03/19/2007.  #3 she was subsequently seen by radiation oncology for consideration of post mastectomy radiation therapy. However, she was not given  radiation.  #4 she was then begun on Femara 2.5 mg daily. She has now completed 5 years of therapy. It was discontinued in June 2014.  #5 Returned to clinic on 08/05/13 for evaluation and management of new anemia. She has underwent PET/CT chest abdomen and pelvis which reveals diffuse lytic lesions throughout the axial and appendicular skeleton. Bone marrow biopsy was consistent with metastatic ER/PR positive breast cancer.  #6 status post Arimidex  #7 palliative single agent Taxol being given weekly until patient has progression of disease  CURRENT THERAPY: Proceed with cycle 13 of dose attenuated Taxol  INTERVAL HISTORY: LYRIKA SOUDERS 68 y.o. female returns for her scheduled chemotherapy today. This is cycle #12 of dose attenuated Taxol. Her last treatment was given on 02/05/2014. She tolerated the last dose of Taxol very well. She does not have any peripheral paresthesias. She has been eating well. She has gained another 2-1/2 pounds. Remainder of the review of systems is as below.  I have reviewed the past medical history, past surgical history, social history and family history with the patient and they are unchanged from previous note.  ALLERGIES:  is allergic to sudafed.  MEDICATIONS:  Current Outpatient Prescriptions  Medication Sig Dispense Refill  . cetirizine (ZYRTEC) 10 MG tablet Take 10 mg by mouth daily.        . Cholecalciferol (VITAMIN D3) 5000 UNITS TABS Take 5,000 mg by mouth daily.      Marland Kitchen dronabinol (MARINOL) 2.5 MG capsule Take 1 capsule (2.5 mg total) by mouth 2 (two) times daily before a meal.  60 capsule  1  . HYDROcodone-acetaminophen (NORCO/VICODIN) 5-325 MG per tablet Take 1 tablet by mouth every 6 (six) hours as needed for moderate pain.  30 tablet  0  . lidocaine-prilocaine (EMLA) cream Apply 1 application topically as needed.  30 g  1  . [place] Mushroom POWD 5 mLs by Does not apply route daily.      . meclizine (ANTIVERT) 25 MG tablet Take 1 tablet (25 mg total) by  mouth 3 (three) times daily as needed for dizziness.  30 tablet  0  . mirtazapine (REMERON) 15 MG tablet       . Multiple Vitamins-Minerals (OSTEO COMPLEX PO) Take by mouth.      Marland Kitchen omeprazole (PRILOSEC) 40 MG capsule Take 40 mg by mouth daily.      . ondansetron (ZOFRAN) 4 MG tablet Take 1 tablet (4 mg total) by mouth every 6 (six) hours as needed for nausea.  20 tablet  0  . traMADol (ULTRAM) 50 MG tablet Take 0.5-1 tablets (25-50 mg total) by mouth every 6 (six) hours as needed for pain.  120 tablet  0  . venlafaxine XR (EFFEXOR-XR) 37.5 MG 24 hr capsule Take 1 capsule (37.5 mg total) by mouth daily with breakfast.  30 capsule  9   No current facility-administered medications for this visit.   Facility-Administered Medications Ordered in Other Visits  Medication Dose Route Frequency Provider Last Rate Last Dose  . sodium chloride 0.9 % injection 10 mL  10 mL Intracatheter PRN Deatra Robinson, MD   10 mL at 02/12/14 1145    REVIEW OF SYSTEMS:   Constitutional: Denies fevers, chills or abnormal weight loss Eyes: Denies blurriness of vision Ears, nose, mouth, throat, and face: Denies mucositis or sore throat Respiratory: Denies cough, dyspnea or wheezes Cardiovascular: Denies palpitation, chest discomfort or lower extremity swelling Gastrointestinal:  Denies nausea, heartburn or change in bowel habits Skin: Denies abnormal skin rashes Lymphatics: Denies new lymphadenopathy or easy bruising Neurological:Denies numbness, tingling or new weaknesses Behavioral/Psych: Mood is stable, no new changes  All other systems were reviewed with the patient and are negative.  PHYSICAL EXAMINATION: ECOG PERFORMANCE STATUS: 1 - Symptomatic but completely ambulatory  Filed Vitals:   02/12/14 0816  BP: 131/84  Pulse: 99  Temp: 98.2 F (36.8 C)  Resp: 18   Filed Weights   02/12/14 0816  Weight: 108 lb 6.4 oz (49.17 kg)    GENERAL:alert, no distress and comfortable SKIN: skin color, texture,  turgor are normal, no rashes or significant lesions EYES: normal, Conjunctiva are pink and non-injected, sclera clear OROPHARYNX:no exudate, no erythema and lips, buccal mucosa, and tongue normal  NECK: supple, thyroid normal size, non-tender, without nodularity LYMPH:  no palpable lymphadenopathy in the cervical, axillary or inguinal LUNGS: clear to auscultation and percussion with normal breathing effort HEART: regular rate & rhythm and no murmurs and no lower extremity edema ABDOMEN:abdomen soft, non-tender and normal bowel sounds Musculoskeletal:no cyanosis of digits and no clubbing  NEURO: alert & oriented x 3 with fluent speech, no focal motor/sensory deficits Port site: Looks clean without any evidence of infections.  LABORATORY DATA:  I have reviewed the data as listed    Component Value Date/Time   NA 142 02/12/2014 0800   NA 140 11/04/2013 0454   K 4.7 02/12/2014 0800   K 3.8 11/04/2013 0454   CL 108 11/04/2013 0454   CL 105 04/17/2013 1323   CO2 25 02/12/2014 0800   CO2 23 11/04/2013 0454   GLUCOSE 106 02/12/2014 0800   GLUCOSE 97 11/04/2013 0454   GLUCOSE 84 04/17/2013 1323   BUN 18.3 02/12/2014 0800   BUN  8 11/04/2013 0454   CREATININE 0.7 02/12/2014 0800   CREATININE 0.43* 11/04/2013 0454   CALCIUM 9.7 02/12/2014 0800   CALCIUM 7.3* 11/04/2013 0454   PROT 6.9 02/12/2014 0800   PROT 7.1 11/03/2013 0548   ALBUMIN 3.5 02/12/2014 0800   ALBUMIN 3.4* 11/03/2013 0548   AST 39* 02/12/2014 0800   AST 31 11/03/2013 0548   ALT 16 02/12/2014 0800   ALT 13 11/03/2013 0548   ALKPHOS 99 02/12/2014 0800   ALKPHOS 224* 11/03/2013 0548   BILITOT 0.59 02/12/2014 0800   BILITOT 0.4 11/03/2013 0548   GFRNONAA >90 11/04/2013 0454   GFRAA >90 11/04/2013 0454    No results found for this basename: SPEP, UPEP,  kappa and lambda light chains    Lab Results  Component Value Date   WBC 6.8 02/12/2014   NEUTROABS 3.3 12/01/2013   HGB 9.5* 02/12/2014   HCT 30.2* 02/12/2014   MCV 91.0 02/12/2014    PLT 28* 02/12/2014      Chemistry      Component Value Date/Time   NA 142 02/12/2014 0800   NA 140 11/04/2013 0454   K 4.7 02/12/2014 0800   K 3.8 11/04/2013 0454   CL 108 11/04/2013 0454   CL 105 04/17/2013 1323   CO2 25 02/12/2014 0800   CO2 23 11/04/2013 0454   BUN 18.3 02/12/2014 0800   BUN 8 11/04/2013 0454   CREATININE 0.7 02/12/2014 0800   CREATININE 0.43* 11/04/2013 0454      Component Value Date/Time   CALCIUM 9.7 02/12/2014 0800   CALCIUM 7.3* 11/04/2013 0454   ALKPHOS 99 02/12/2014 0800   ALKPHOS 224* 11/03/2013 0548   AST 39* 02/12/2014 0800   AST 31 11/03/2013 0548   ALT 16 02/12/2014 0800   ALT 13 11/03/2013 0548   BILITOT 0.59 02/12/2014 0800   BILITOT 0.4 11/03/2013 0548       RADIOGRAPHIC STUDIES: I have personally reviewed the radiological images as listed and agreed with the findings in the report. No results found.    ASSESSMENT & PLAN:  68 year old female with  #1 stage IV metastatic invasive lobular carcinoma. She was originally diagnosed in 2007. She is currently receiving palliative chemotherapy consisting of weekly Taxol which is dose attenuated. She continues to tolerate this very well. She has minimal side effects from it. Patient will proceed with cycle 13 of Taxol. I reviewed her labs with her today.  #2 anemia: Patient is slightly anemic with a hemoglobin of 9.5 today. This is stable. She is asymptomatic we will continue to monitor.  #3 thrombocytopenia: This is been chronic and ongoing. She has not had any bleeding problems no bruising. Her platelets today are 28,000.  #4 chronic pain: Patient's pain has been very well controlled. They tell me that she in fact is not taking any pain medications. We will continue to monitor her closely.  #5 bone metastasis: This is from the breast cancer. She has been receiving Xgeva monthly.  #6 followup: Patient will be seen back in one week for next dose of Taxol. Patient will eventually need restaging CT scans. I  have discussed this with them. We will plan on doing these sometime in April 2015.   All questions were answered. The patient knows to call the clinic with any problems, questions or concerns. No barriers to learning was detected. I spent 25 minutes counseling the patient face to face. The total time spent in the appointment was 30 minutes and more than 50%  was on counseling and review of test results     Marcy Panning, MD 02/12/2014 12:43 PM

## 2014-02-19 ENCOUNTER — Other Ambulatory Visit: Payer: Self-pay | Admitting: Oncology

## 2014-02-19 ENCOUNTER — Ambulatory Visit (HOSPITAL_BASED_OUTPATIENT_CLINIC_OR_DEPARTMENT_OTHER): Payer: 59 | Admitting: Oncology

## 2014-02-19 ENCOUNTER — Encounter: Payer: Self-pay | Admitting: Oncology

## 2014-02-19 ENCOUNTER — Ambulatory Visit (HOSPITAL_BASED_OUTPATIENT_CLINIC_OR_DEPARTMENT_OTHER): Payer: 59

## 2014-02-19 ENCOUNTER — Other Ambulatory Visit: Payer: 59

## 2014-02-19 VITALS — BP 118/76 | HR 118 | Temp 98.4°F | Resp 18 | Ht 63.0 in | Wt 107.7 lb

## 2014-02-19 DIAGNOSIS — C7951 Secondary malignant neoplasm of bone: Secondary | ICD-10-CM

## 2014-02-19 DIAGNOSIS — C7952 Secondary malignant neoplasm of bone marrow: Secondary | ICD-10-CM

## 2014-02-19 DIAGNOSIS — C50919 Malignant neoplasm of unspecified site of unspecified female breast: Secondary | ICD-10-CM

## 2014-02-19 DIAGNOSIS — G8929 Other chronic pain: Secondary | ICD-10-CM

## 2014-02-19 DIAGNOSIS — D696 Thrombocytopenia, unspecified: Secondary | ICD-10-CM

## 2014-02-19 DIAGNOSIS — D649 Anemia, unspecified: Secondary | ICD-10-CM

## 2014-02-19 DIAGNOSIS — Z5111 Encounter for antineoplastic chemotherapy: Secondary | ICD-10-CM

## 2014-02-19 LAB — MANUAL DIFFERENTIAL
ALC: 1.7 10*3/uL (ref 0.9–3.3)
ANC (CHCC MAN DIFF): 3.5 10*3/uL (ref 1.5–6.5)
BAND NEUTROPHILS: 16 % — AB (ref 0–10)
BLASTS: 3 % — AB (ref 0–0)
Basophil: 4 % — ABNORMAL HIGH (ref 0–2)
EOS: 3 % (ref 0–7)
LYMPH: 23 % (ref 14–49)
MONO: 16 % — ABNORMAL HIGH (ref 0–14)
Metamyelocytes: 6 % — ABNORMAL HIGH (ref 0–0)
Myelocytes: 11 % — ABNORMAL HIGH (ref 0–0)
NRBC: 39 % — AB (ref 0–0)
OTHER CELL: 0 % (ref 0–0)
PLT EST: DECREASED
PROMYELO: 2 % — ABNORMAL HIGH (ref 0–0)
SEG: 16 % — AB (ref 38–77)
VARIANT LYMPH: 0 % (ref 0–0)

## 2014-02-19 LAB — COMPREHENSIVE METABOLIC PANEL (CC13)
ALK PHOS: 92 U/L (ref 40–150)
ALT: 16 U/L (ref 0–55)
AST: 48 U/L — AB (ref 5–34)
Albumin: 3.6 g/dL (ref 3.5–5.0)
Anion Gap: 11 mEq/L (ref 3–11)
BUN: 16 mg/dL (ref 7.0–26.0)
CHLORIDE: 105 meq/L (ref 98–109)
CO2: 23 mEq/L (ref 22–29)
Calcium: 9 mg/dL (ref 8.4–10.4)
Creatinine: 0.8 mg/dL (ref 0.6–1.1)
Glucose: 114 mg/dl (ref 70–140)
Potassium: 4 mEq/L (ref 3.5–5.1)
Sodium: 138 mEq/L (ref 136–145)
Total Bilirubin: 0.67 mg/dL (ref 0.20–1.20)
Total Protein: 6.9 g/dL (ref 6.4–8.3)

## 2014-02-19 LAB — CBC WITH DIFFERENTIAL/PLATELET
HEMATOCRIT: 30 % — AB (ref 34.8–46.6)
HGB: 9.4 g/dL — ABNORMAL LOW (ref 11.6–15.9)
MCH: 28.6 pg (ref 25.1–34.0)
MCHC: 31.3 g/dL — ABNORMAL LOW (ref 31.5–36.0)
MCV: 91.2 fL (ref 79.5–101.0)
Platelets: 25 10*3/uL — ABNORMAL LOW (ref 145–400)
RBC: 3.29 10*6/uL — ABNORMAL LOW (ref 3.70–5.45)
RDW: 19.5 % — ABNORMAL HIGH (ref 11.2–14.5)
WBC: 7.2 10*3/uL (ref 3.9–10.3)

## 2014-02-19 MED ORDER — ONDANSETRON 8 MG/NS 50 ML IVPB
INTRAVENOUS | Status: AC
  Filled 2014-02-19: qty 8

## 2014-02-19 MED ORDER — FAMOTIDINE IN NACL 20-0.9 MG/50ML-% IV SOLN
20.0000 mg | Freq: Once | INTRAVENOUS | Status: AC
  Administered 2014-02-19: 20 mg via INTRAVENOUS

## 2014-02-19 MED ORDER — DIPHENHYDRAMINE HCL 50 MG/ML IJ SOLN
25.0000 mg | Freq: Once | INTRAMUSCULAR | Status: AC
  Administered 2014-02-19: 25 mg via INTRAVENOUS

## 2014-02-19 MED ORDER — DEXAMETHASONE SODIUM PHOSPHATE 20 MG/5ML IJ SOLN
20.0000 mg | Freq: Once | INTRAMUSCULAR | Status: AC
  Administered 2014-02-19: 20 mg via INTRAVENOUS

## 2014-02-19 MED ORDER — SODIUM CHLORIDE 0.9 % IV SOLN
60.0000 mg/m2 | Freq: Once | INTRAVENOUS | Status: AC
  Administered 2014-02-19: 84 mg via INTRAVENOUS
  Filled 2014-02-19: qty 14

## 2014-02-19 MED ORDER — DEXAMETHASONE SODIUM PHOSPHATE 20 MG/5ML IJ SOLN
INTRAMUSCULAR | Status: AC
Start: 2014-02-19 — End: 2014-02-19
  Filled 2014-02-19: qty 5

## 2014-02-19 MED ORDER — ONDANSETRON 8 MG/50ML IVPB (CHCC)
8.0000 mg | Freq: Once | INTRAVENOUS | Status: AC
  Administered 2014-02-19: 8 mg via INTRAVENOUS

## 2014-02-19 MED ORDER — FAMOTIDINE IN NACL 20-0.9 MG/50ML-% IV SOLN
INTRAVENOUS | Status: AC
  Filled 2014-02-19: qty 50

## 2014-02-19 MED ORDER — SODIUM CHLORIDE 0.9 % IJ SOLN
10.0000 mL | INTRAMUSCULAR | Status: DC | PRN
  Filled 2014-02-19: qty 10

## 2014-02-19 MED ORDER — HEPARIN SOD (PORK) LOCK FLUSH 100 UNIT/ML IV SOLN
500.0000 [IU] | Freq: Once | INTRAVENOUS | Status: DC | PRN
  Filled 2014-02-19: qty 5

## 2014-02-19 MED ORDER — SODIUM CHLORIDE 0.9 % IV SOLN
Freq: Once | INTRAVENOUS | Status: AC
  Administered 2014-02-19: 09:00:00 via INTRAVENOUS

## 2014-02-19 MED ORDER — DIPHENHYDRAMINE HCL 50 MG/ML IJ SOLN
INTRAMUSCULAR | Status: AC
  Filled 2014-02-19: qty 1

## 2014-02-19 NOTE — Patient Instructions (Signed)
Proceed with Taxol dose reduced  Return in one week for followup chemotherapy if platelets greater than 25,000 and Delton See

## 2014-02-19 NOTE — Progress Notes (Signed)
[facility] OFFICE PROGRESS NOTE  Patient Care Team: [name], MD as PCP - General (Family Medicine) Dr. 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 Diagnosed in October 2007 as T1N1   SUMMARY OF ONCOLOGIC HISTORY: #1 patient presented with a mass in her right breast in October 2007. She underwent bilateral mammograms. The right breast ultrasound performed on 08/19/2006 showed increased density and distortion in the subareolar location of the right breast. Ultrasound showed abnormal echoes in the subareolar location which was a change measuring 1.5 cm. Patient underwent an ultrasound-guided biopsy on 08/23/2006 which showed invasive mammary carcinoma with lobular features. ER +100% PR 3% proliferation marker Ki-67 12% HER-2/neu 2+ by fish. MRI of the breasts on 08/30/2006 showed an ill-defined enhancing mass in the right breast measuring 2.6 x 4.0 x 4.1 cm. Enhancement trailed out to the nipple but did not involve the nipple. There was also a small enhancing internal mammary node measuring 7 mm suspicious for metastatic disease. Left breast was negative.  #2 patient was seen by Dr. Eston Esters on 09/09/2006. She received neoadjuvant chemotherapy initially consisting of FEC regimen given dose dense for 4 cycles followed by Taxotere. She received her chemotherapy she received her chemotherapy from 09/20/2006 through February 2008. She then went on to lumpectomy on 02/07/2007. However there was essentially residual tumor dispersed over an area 1.8 cm with some close surgical margins at the medial area. Her case was reviewed at the breast conference and it was suggested she undergo mastectomy. Patient underwent a mastectomy on 03/19/2007.  #3 she was subsequently seen by radiation oncology for consideration of post mastectomy radiation therapy. However, she was not given  radiation.  #4 she was then begun on Femara 2.5 mg daily. She has now completed 5 years of therapy. It was discontinued in June 2014.  #5 Returned to clinic on 08/05/13 for evaluation and management of new anemia. She has underwent PET/CT chest abdomen and pelvis which reveals diffuse lytic lesions throughout the axial and appendicular skeleton. Bone marrow biopsy was consistent with metastatic ER/PR positive breast cancer.  #6 status post Arimidex  #7 palliative single agent Taxol being given weekly until patient has progression of disease  CURRENT THERAPY: Proceed with cycle 14 of dose attenuated Taxol  INTERVAL HISTORY: [name] 68 y.o. female returns for followup visit prior to cycle 14 of her chemotherapy consisting of Taxol weekly. Clinically she looks terrific. She continues to eat well. She continues to gain weight. She denies having any nausea or vomiting abdominal pain. She has no musculoskeletal pain reported today. She has no peripheral paresthesias. She denies any myalgias and arthralgias. She has no bleeding. She denies any epistaxis bleeding from her gowns no cough hemoptysis hematemesis hematuria hematochezia or easy bruising. Her platelet count today is 25,000. [name] is negative and as below.  I have reviewed the past medical history, past surgical history, social history and family history with the patient and they are unchanged from previous note.  ALLERGIES:  is allergic to sudafed.  MEDICATIONS:  Current Outpatient Prescriptions  Medication Sig Dispense Refill  . cetirizine (ZYRTEC) 10 MG tablet Take 10 mg by mouth daily.        . Cholecalciferol (VITAMIN D3) 5000 UNITS TABS Take 5,000 mg by mouth daily.      Marland Kitchen dronabinol (MARINOL) 2.5 MG capsule Take 1 capsule (  2.5 mg total) by mouth 2 (two) times daily before a meal.  60 capsule  1  . HYDROcodone-acetaminophen (NORCO/VICODIN) 5-325 MG per tablet Take 1 tablet by mouth every 6 (six)  hours as needed for moderate pain.  30 tablet  0  . lidocaine-prilocaine (EMLA) cream Apply 1 application topically as needed.  30 g  1  . [place] Mushroom POWD 5 mLs by Does not apply route daily.      . meclizine (ANTIVERT) 25 MG tablet Take 1 tablet (25 mg total) by mouth 3 (three) times daily as needed for dizziness.  30 tablet  0  . mirtazapine (REMERON) 15 MG tablet       . Multiple Vitamins-Minerals (OSTEO COMPLEX PO) Take by mouth.      Marland Kitchen omeprazole (PRILOSEC) 40 MG capsule Take 40 mg by mouth daily.      . ondansetron (ZOFRAN) 4 MG tablet Take 1 tablet (4 mg total) by mouth every 6 (six) hours as needed for nausea.  20 tablet  0  . traMADol (ULTRAM) 50 MG tablet Take 0.5-1 tablets (25-50 mg total) by mouth every 6 (six) hours as needed for pain.  120 tablet  0  . venlafaxine XR (EFFEXOR-XR) 37.5 MG 24 hr capsule Take 1 capsule (37.5 mg total) by mouth daily with breakfast.  30 capsule  9   No current facility-administered medications for this visit.    REVIEW OF SYSTEMS:   Constitutional: Denies fevers, chills or abnormal weight loss Eyes: Denies blurriness of vision Ears, nose, mouth, throat, and face: Denies mucositis or sore throat Respiratory: Denies cough, dyspnea or wheezes Cardiovascular: Denies palpitation, chest discomfort or lower extremity swelling Gastrointestinal:  Denies nausea, heartburn or change in bowel habits Skin: Denies abnormal skin rashes Lymphatics: Denies new lymphadenopathy or easy bruising Neurological:Denies numbness, tingling or new weaknesses Behavioral/Psych: Mood is stable, no new changes  All other systems were reviewed with the patient and are negative.  PHYSICAL EXAMINATION: ECOG PERFORMANCE STATUS: 1 - Symptomatic but completely ambulatory  Filed Vitals:   02/19/14 0822  BP: 118/76  Pulse: 118  Temp: 98.4 F (36.9 C)  Resp: 18   Filed Weights   02/19/14 6789  Weight: 107 lb 11.2 oz (48.852 kg)    GENERAL:alert, no distress and  comfortable SKIN: skin color, texture, turgor are normal, no rashes or significant lesions EYES: normal, Conjunctiva are pink and non-injected, sclera clear OROPHARYNX:no exudate, no erythema and lips, buccal mucosa, and tongue normal  NECK: supple, thyroid normal size, non-tender, without nodularity LYMPH:  no palpable lymphadenopathy in the cervical, axillary or inguinal LUNGS: clear to auscultation and percussion with normal breathing effort HEART: regular rate & rhythm and no murmurs and no lower extremity edema ABDOMEN:abdomen soft, non-tender and normal bowel sounds Musculoskeletal:no cyanosis of digits and no clubbing  NEURO: alert & oriented x 3 with fluent speech, no focal motor/sensory deficits Port site: Looks clean without any evidence of infections.  LABORATORY DATA:  I have reviewed the data    RADIOGRAPHIC STUDIES: I have personally reviewed the radiological images as listed and agreed with the findings in the report. No results found.    ASSESSMENT & PLAN:  68 year old female with  #1 stage IV metastatic invasive lobular carcinoma. She was originally diagnosed in 2007. She is currently receiving palliative chemotherapy consisting of weekly Taxol which is dose attenuated. She continues to tolerate this very well. She has minimal side effects from it. Patient will proceed with cycle 14 of Taxol. I  reviewed her labs with her today.Patient will eventually need restaging CT scans. I have discussed this with them. We will plan on doing these sometime in April 2015.  #2 anemia: This is stable. Her hemoglobin is 9.4. She has not had any bleeding problems she is asymptomatic from this. We will continue to observe.  #3 thrombocytopenia: Platelets 25,000. No bleeding or bruising. This is adequate enough to proceed with treatment. We have discussed the possibility of using N plate unfortunately there are no clinical data or guidelines to use this.  #4 chronic pain: Patient is  without any pain. She does have prescriptions for hydrocodone and tramadol. She has not been taking these because she has been asymptomatic  #5 bone metastasis: Patient will receive XGeva on 02/26/14. She understands the risks benefits and side effects as well as complications of this drug.  #6 followup: Patient will be seen back in one week for next dose of Taxol. We can treat her if her platelets are greater than 25,000. However if there are any other concerns by the covering provider in my absence this certainly will make that decision.   All questions were answered. The patient knows to call the clinic with any problems, questions or concerns. No barriers to learning was detected. I spent 25 minutes counseling the patient face to face. The total time spent in the appointment was 30 minutes and more than 50% was on counseling and review of test results     Marcy Panning, MD 02/19/2014 8:41 AM

## 2014-02-19 NOTE — Patient Instructions (Signed)
[facility] Discharge Instructions for Patients Receiving Chemotherapy  Today you received the following chemotherapy agents Taxol  To help prevent nausea and vomiting after your treatment, we encourage you to take your nausea medication    If you develop nausea and vomiting that is not controlled by your nausea medication, call the clinic.   BELOW ARE SYMPTOMS THAT SHOULD BE REPORTED IMMEDIATELY:  *FEVER GREATER THAN 100.5 F  *CHILLS WITH OR WITHOUT FEVER  NAUSEA AND VOMITING THAT IS NOT CONTROLLED WITH YOUR NAUSEA MEDICATION  *UNUSUAL SHORTNESS OF BREATH  *UNUSUAL BRUISING OR BLEEDING  TENDERNESS IN MOUTH AND THROAT WITH OR WITHOUT PRESENCE OF ULCERS  *URINARY PROBLEMS  *BOWEL PROBLEMS  UNUSUAL RASH Items with * indicate a potential emergency and should be followed up as soon as possible.  Feel free to call the clinic you have any questions or concerns. The clinic phone number is [phone].    

## 2014-02-23 ENCOUNTER — Telehealth: Payer: Self-pay | Admitting: Oncology

## 2014-02-23 NOTE — Telephone Encounter (Signed)
LMONVM FOR PT RE APPTS FOR 4/3 AND PT TO GET NEW SCHEDULE WHEN SHE COMES IN. ALSO CALLED CELL AND S/W HUSBAND RE APPT. S/W AJ AND AJ TAKING CARE OF GETTING PT SEEN 4/3 AND 4/10 FOR KK. PER AJ PT ADDED TO [facility] SCHEDULE 4/3 AND 4/10 AND SHE WILL.

## 2014-02-26 ENCOUNTER — Encounter: Payer: Self-pay | Admitting: Oncology

## 2014-02-26 ENCOUNTER — Ambulatory Visit (HOSPITAL_BASED_OUTPATIENT_CLINIC_OR_DEPARTMENT_OTHER): Payer: 59 | Admitting: Oncology

## 2014-02-26 ENCOUNTER — Other Ambulatory Visit (HOSPITAL_BASED_OUTPATIENT_CLINIC_OR_DEPARTMENT_OTHER): Payer: 59

## 2014-02-26 ENCOUNTER — Ambulatory Visit: Payer: 59

## 2014-02-26 ENCOUNTER — Ambulatory Visit (HOSPITAL_BASED_OUTPATIENT_CLINIC_OR_DEPARTMENT_OTHER): Payer: 59

## 2014-02-26 ENCOUNTER — Other Ambulatory Visit: Payer: Self-pay | Admitting: Oncology

## 2014-02-26 VITALS — BP 124/78 | HR 111 | Temp 97.0°F | Resp 20 | Ht 63.0 in | Wt 109.7 lb

## 2014-02-26 DIAGNOSIS — Z5111 Encounter for antineoplastic chemotherapy: Secondary | ICD-10-CM

## 2014-02-26 DIAGNOSIS — C50119 Malignant neoplasm of central portion of unspecified female breast: Secondary | ICD-10-CM

## 2014-02-26 DIAGNOSIS — C50919 Malignant neoplasm of unspecified site of unspecified female breast: Secondary | ICD-10-CM

## 2014-02-26 DIAGNOSIS — C7952 Secondary malignant neoplasm of bone marrow: Secondary | ICD-10-CM

## 2014-02-26 DIAGNOSIS — C7951 Secondary malignant neoplasm of bone: Secondary | ICD-10-CM

## 2014-02-26 DIAGNOSIS — D696 Thrombocytopenia, unspecified: Secondary | ICD-10-CM

## 2014-02-26 DIAGNOSIS — Z95828 Presence of other vascular implants and grafts: Secondary | ICD-10-CM

## 2014-02-26 DIAGNOSIS — G8929 Other chronic pain: Secondary | ICD-10-CM

## 2014-02-26 DIAGNOSIS — D649 Anemia, unspecified: Secondary | ICD-10-CM

## 2014-02-26 LAB — MANUAL DIFFERENTIAL
ALC: 2.1 10*3/uL (ref 0.9–3.3)
ANC (CHCC manual diff): 3.3 10*3/uL (ref 1.5–6.5)
BASOPHIL: 4 % — AB (ref 0–2)
Band Neutrophils: 12 % — ABNORMAL HIGH (ref 0–10)
Blasts: 2 % — ABNORMAL HIGH (ref 0–0)
EOS%: 2 % (ref 0–7)
LYMPH: 30 % (ref 14–49)
MONO: 13 % (ref 0–14)
MYELOCYTES: 9 % — AB (ref 0–0)
Metamyelocytes: 15 % — ABNORMAL HIGH (ref 0–0)
Other Cell: 0 % (ref 0–0)
PLT EST: DECREASED
PROMYELO: 3 % — AB (ref 0–0)
SEG: 10 % — ABNORMAL LOW (ref 38–77)
Variant Lymph: 0 % (ref 0–0)
nRBC: 28 % — ABNORMAL HIGH (ref 0–0)

## 2014-02-26 LAB — COMPREHENSIVE METABOLIC PANEL (CC13)
ALBUMIN: 3.6 g/dL (ref 3.5–5.0)
ALK PHOS: 86 U/L (ref 40–150)
ALT: 14 U/L (ref 0–55)
AST: 42 U/L — AB (ref 5–34)
Anion Gap: 13 mEq/L — ABNORMAL HIGH (ref 3–11)
BUN: 17.6 mg/dL (ref 7.0–26.0)
CO2: 24 mEq/L (ref 22–29)
Calcium: 9.6 mg/dL (ref 8.4–10.4)
Chloride: 104 mEq/L (ref 98–109)
Creatinine: 0.8 mg/dL (ref 0.6–1.1)
Glucose: 117 mg/dl (ref 70–140)
POTASSIUM: 4.1 meq/L (ref 3.5–5.1)
SODIUM: 141 meq/L (ref 136–145)
TOTAL PROTEIN: 6.9 g/dL (ref 6.4–8.3)
Total Bilirubin: 0.59 mg/dL (ref 0.20–1.20)

## 2014-02-26 LAB — CBC WITH DIFFERENTIAL/PLATELET
HEMATOCRIT: 28.9 % — AB (ref 34.8–46.6)
HGB: 8.9 g/dL — ABNORMAL LOW (ref 11.6–15.9)
MCH: 28.5 pg (ref 25.1–34.0)
MCHC: 30.8 g/dL — ABNORMAL LOW (ref 31.5–36.0)
MCV: 92.6 fL (ref 79.5–101.0)
Platelets: 24 10*3/uL — ABNORMAL LOW (ref 145–400)
RBC: 3.12 10*6/uL — ABNORMAL LOW (ref 3.70–5.45)
RDW: 19.8 % — AB (ref 11.2–14.5)
WBC: 7.1 10*3/uL (ref 3.9–10.3)

## 2014-02-26 MED ORDER — DIPHENHYDRAMINE HCL 50 MG/ML IJ SOLN
25.0000 mg | Freq: Once | INTRAMUSCULAR | Status: AC
  Administered 2014-02-26: 25 mg via INTRAVENOUS

## 2014-02-26 MED ORDER — SODIUM CHLORIDE 0.9 % IV SOLN
Freq: Once | INTRAVENOUS | Status: AC
  Administered 2014-02-26: 12:00:00 via INTRAVENOUS

## 2014-02-26 MED ORDER — FAMOTIDINE IN NACL 20-0.9 MG/50ML-% IV SOLN
INTRAVENOUS | Status: AC
  Filled 2014-02-26: qty 50

## 2014-02-26 MED ORDER — DRONABINOL 2.5 MG PO CAPS: 2.5000 mg | ORAL_CAPSULE | Freq: Two times a day (BID) | ORAL | Status: DC

## 2014-02-26 MED ORDER — FAMOTIDINE IN NACL 20-0.9 MG/50ML-% IV SOLN
20.0000 mg | Freq: Once | INTRAVENOUS | Status: AC
  Administered 2014-02-26: 20 mg via INTRAVENOUS

## 2014-02-26 MED ORDER — HEPARIN SOD (PORK) LOCK FLUSH 100 UNIT/ML IV SOLN
500.0000 [IU] | Freq: Once | INTRAVENOUS | Status: AC
  Administered 2014-02-26: 500 [IU] via INTRAVENOUS
  Filled 2014-02-26: qty 5

## 2014-02-26 MED ORDER — ONDANSETRON 8 MG/NS 50 ML IVPB
INTRAVENOUS | Status: AC
Start: 2014-02-26 — End: 2014-02-26
  Filled 2014-02-26: qty 8

## 2014-02-26 MED ORDER — SODIUM CHLORIDE 0.9 % IV SOLN: Freq: Once | INTRAVENOUS | Status: DC | PRN

## 2014-02-26 MED ORDER — SODIUM CHLORIDE 0.9 % IJ SOLN
10.0000 mL | INTRAMUSCULAR | Status: DC | PRN
  Administered 2014-02-26: 10 mL via INTRAVENOUS
  Filled 2014-02-26: qty 10

## 2014-02-26 MED ORDER — DEXAMETHASONE SODIUM PHOSPHATE 20 MG/5ML IJ SOLN
INTRAMUSCULAR | Status: AC
  Filled 2014-02-26: qty 5

## 2014-02-26 MED ORDER — DIPHENHYDRAMINE HCL 50 MG/ML IJ SOLN
INTRAMUSCULAR | Status: AC
  Filled 2014-02-26: qty 1

## 2014-02-26 MED ORDER — SODIUM CHLORIDE 0.9 % IJ SOLN
10.0000 mL | INTRAMUSCULAR | Status: DC | PRN
  Administered 2014-02-26: 10 mL
  Filled 2014-02-26: qty 10

## 2014-02-26 MED ORDER — SODIUM CHLORIDE 0.9 % IV SOLN
60.0000 mg/m2 | Freq: Once | INTRAVENOUS | Status: AC
  Administered 2014-02-26: 84 mg via INTRAVENOUS
  Filled 2014-02-26: qty 14

## 2014-02-26 MED ORDER — ONDANSETRON 8 MG/50ML IVPB (CHCC)
8.0000 mg | Freq: Once | INTRAVENOUS | Status: AC
  Administered 2014-02-26: 8 mg via INTRAVENOUS

## 2014-02-26 MED ORDER — DEXAMETHASONE SODIUM PHOSPHATE 20 MG/5ML IJ SOLN
20.0000 mg | Freq: Once | INTRAMUSCULAR | Status: AC
  Administered 2014-02-26: 20 mg via INTRAVENOUS

## 2014-02-26 MED ORDER — DENOSUMAB 120 MG/1.7ML ~~LOC~~ SOLN
120.0000 mg | Freq: Once | SUBCUTANEOUS | Status: AC
  Administered 2014-02-26: 120 mg via SUBCUTANEOUS
  Filled 2014-02-26: qty 1.7

## 2014-02-26 NOTE — Patient Instructions (Signed)
[facility] Discharge Instructions for Patients Receiving Chemotherapy  Today you received the following chemotherapy agents:  Taxol  To help prevent nausea and vomiting after your treatment, we encourage you to take your nausea medication.  If you develop nausea and vomiting that is not controlled by your nausea medication, call the clinic.   BELOW ARE SYMPTOMS THAT SHOULD BE REPORTED IMMEDIATELY:  *FEVER GREATER THAN 100.5 F  *CHILLS WITH OR WITHOUT FEVER  NAUSEA AND VOMITING THAT IS NOT CONTROLLED WITH YOUR NAUSEA MEDICATION  *UNUSUAL SHORTNESS OF BREATH  *UNUSUAL BRUISING OR BLEEDING  TENDERNESS IN MOUTH AND THROAT WITH OR WITHOUT PRESENCE OF ULCERS  *URINARY PROBLEMS  *BOWEL PROBLEMS  UNUSUAL RASH Items with * indicate a potential emergency and should be followed up as soon as possible.  Feel free to call the clinic you have any questions or concerns. The clinic phone number is [phone].  Call if any bleeding or bruising.

## 2014-02-26 NOTE — Patient Instructions (Signed)

## 2014-02-26 NOTE — Progress Notes (Signed)
[facility] OFFICE PROGRESS NOTE  Patient Care Team: [name], MD as PCP - General (Family Medicine) Dr. 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 Diagnosed in October 2007 as T1N1   SUMMARY OF ONCOLOGIC HISTORY: #1 patient presented with a mass in her right breast in October 2007. She underwent bilateral mammograms. The right breast ultrasound performed on 08/19/2006 showed increased density and distortion in the subareolar location of the right breast. Ultrasound showed abnormal echoes in the subareolar location which was a change measuring 1.5 cm. Patient underwent an ultrasound-guided biopsy on 08/23/2006 which showed invasive mammary carcinoma with lobular features. ER +100% PR 3% proliferation marker Ki-67 12% HER-2/neu 2+ by fish. MRI of the breasts on 08/30/2006 showed an ill-defined enhancing mass in the right breast measuring 2.6 x 4.0 x 4.1 cm. Enhancement trailed out to the nipple but did not involve the nipple. There was also a small enhancing internal mammary node measuring 7 mm suspicious for metastatic disease. Left breast was negative.  #2 patient was seen by Dr. Eston Esters on 09/09/2006. She received neoadjuvant chemotherapy initially consisting of FEC regimen given dose dense for 4 cycles followed by Taxotere. She received her chemotherapy she received her chemotherapy from 09/20/2006 through February 2008. She then went on to lumpectomy on 02/07/2007. However there was essentially residual tumor dispersed over an area 1.8 cm with some close surgical margins at the medial area. Her case was reviewed at the breast conference and it was suggested she undergo mastectomy. Patient underwent a mastectomy on 03/19/2007.  #3 she was subsequently seen by radiation oncology for consideration of post mastectomy radiation therapy. However, she was not given  radiation.  #4 she was then begun on Femara 2.5 mg daily. She has now completed 5 years of therapy. It was discontinued in June 2014.  #5 Returned to clinic on 08/05/13 for evaluation and management of new anemia. She has underwent PET/CT chest abdomen and pelvis which reveals diffuse lytic lesions throughout the axial and appendicular skeleton. Bone marrow biopsy was consistent with metastatic ER/PR positive breast cancer.  #6 status post Arimidex  #7 palliative single agent Taxol being given weekly until patient has progression of disease  CURRENT THERAPY: Proceed with cycle 15 of dose attenuated Taxol  INTERVAL HISTORY: JONTE WOLLAM 68 y.o. female returns for followup visit prior to cycle 15 of her chemotherapy consisting of Taxol weekly. Clinically she looks terrific. She continues to eat well. She continues to gain weight. She denies having any nausea or vomiting abdominal pain. She has no musculoskeletal pain reported today. She has no peripheral paresthesias. She denies any myalgias and arthralgias. She has no bleeding. She denies any epistaxis bleeding from her gowns no cough hemoptysis hematemesis hematuria hematochezia or easy bruising. Her platelet count today is 24,000. [name] is negative and as below.  I have reviewed the past medical history, past surgical history, social history and family history with the patient and they are unchanged from previous note.  ALLERGIES:  is allergic to sudafed.  MEDICATIONS:  Current Outpatient Prescriptions  Medication Sig Dispense Refill  . cetirizine (ZYRTEC) 10 MG tablet Take 10 mg by mouth daily.        . Cholecalciferol (VITAMIN D3) 5000 UNITS TABS Take 5,000 mg by mouth daily.      Marland Kitchen dronabinol (MARINOL) 2.5 MG capsule Take 1 capsule (  2.5 mg total) by mouth 2 (two) times daily before a meal.  60 capsule  0  . lidocaine-prilocaine (EMLA) cream Apply 1 application topically as needed.  30 g  1  . [place]  Mushroom POWD 5 mLs by Does not apply route daily.      . Multiple Vitamins-Minerals (OSTEO COMPLEX PO) Take by mouth.      Marland Kitchen omeprazole (PRILOSEC) 40 MG capsule Take 40 mg by mouth daily.      Marland Kitchen venlafaxine XR (EFFEXOR-XR) 37.5 MG 24 hr capsule Take 1 capsule (37.5 mg total) by mouth daily with breakfast.  30 capsule  9  . HYDROcodone-acetaminophen (NORCO/VICODIN) 5-325 MG per tablet Take 1 tablet by mouth every 6 (six) hours as needed for moderate pain.  30 tablet  0  . meclizine (ANTIVERT) 25 MG tablet Take 1 tablet (25 mg total) by mouth 3 (three) times daily as needed for dizziness.  30 tablet  0  . mirtazapine (REMERON) 15 MG tablet       . ondansetron (ZOFRAN) 4 MG tablet Take 1 tablet (4 mg total) by mouth every 6 (six) hours as needed for nausea.  20 tablet  0  . traMADol (ULTRAM) 50 MG tablet Take 0.5-1 tablets (25-50 mg total) by mouth every 6 (six) hours as needed for pain.  120 tablet  0   No current facility-administered medications for this visit.   Facility-Administered Medications Ordered in Other Visits  Medication Dose Route Frequency Provider Last Rate Last Dose  . sodium chloride 0.9 % injection 10 mL  10 mL Intravenous PRN Deatra Robinson, MD   10 mL at 02/26/14 1032    REVIEW OF SYSTEMS:   Constitutional: Denies fevers, chills or abnormal weight loss Eyes: Denies blurriness of vision Ears, nose, mouth, throat, and face: Denies mucositis or sore throat Respiratory: Denies cough, dyspnea or wheezes Cardiovascular: Denies palpitation, chest discomfort or lower extremity swelling Gastrointestinal:  Denies nausea, heartburn or change in bowel habits Skin: Denies abnormal skin rashes Lymphatics: Denies new lymphadenopathy or easy bruising Neurological:Denies numbness, tingling or new weaknesses Behavioral/Psych: Mood is stable, no new changes  All other systems were reviewed with the patient and are negative.  PHYSICAL EXAMINATION: ECOG PERFORMANCE STATUS: 1 -  Symptomatic but completely ambulatory  Filed Vitals:   02/26/14 1101  BP: 124/78  Pulse: 111  Temp: 97 F (36.1 C)  Resp: 20   Filed Weights   02/26/14 1101  Weight: 109 lb 11.2 oz (49.76 kg)    GENERAL:alert, no distress and comfortable SKIN: skin color, texture, turgor are normal, no rashes or significant lesions EYES: normal, Conjunctiva are pink and non-injected, sclera clear OROPHARYNX:no exudate, no erythema and lips, buccal mucosa, and tongue normal  NECK: supple, thyroid normal size, non-tender, without nodularity LYMPH:  no palpable lymphadenopathy in the cervical, axillary or inguinal LUNGS: clear to auscultation and percussion with normal breathing effort HEART: regular rate & rhythm and no murmurs and no lower extremity edema ABDOMEN:abdomen soft, non-tender and normal bowel sounds Musculoskeletal:no cyanosis of digits and no clubbing  NEURO: alert & oriented x 3 with fluent speech, no focal motor/sensory deficits Port site: Looks clean without any evidence of infections.  LABORATORY DATA:  I have reviewed the data    RADIOGRAPHIC STUDIES: I have personally reviewed the radiological images as listed and agreed with the findings in the report. No results found.    ASSESSMENT & PLAN:  68 year old female with  #1 stage IV metastatic invasive lobular carcinoma.  She was originally diagnosed in 2007. She is currently receiving palliative chemotherapy consisting of weekly Taxol which is dose attenuated. She continues to tolerate this very well. She has minimal side effects from it. I have reviewed CBC with Dr Alen Blew in Dr Laurelyn Sickle absence today. Patient will proceed with cycle 15 of Taxol. I reviewed her labs with her today. Patient will eventually need restaging CT scans. I have discussed this with them. We will plan on doing these sometime in April 2015. Will discuss with Dr Humphrey Rolls upon her return.  #2 anemia: This is stable. Her hemoglobin is 8.9. She has not had any  bleeding problems she is asymptomatic from this. We will continue to observe.  #3 thrombocytopenia: Platelets 24,000. No bleeding or bruising. This is adequate enough to proceed with treatment. We have discussed the possibility of using N plate unfortunately there are no clinical data or guidelines to use this.  #4 chronic pain: Patient is without any pain. She does have prescriptions for hydrocodone and tramadol. She has not been taking these because she has been asymptomatic  #5 bone metastasis: Patient will receive XGeva today. She understands the risks benefits and side effects as well as complications of this drug.  #6 followup: Patient will be seen back in one week for next dose of Taxol.    All questions were answered. The patient knows to call the clinic with any problems, questions or concerns. No barriers to learning was detected. I spent 25 minutes counseling the patient face to face. The total time spent in the appointment was 30 minutes and more than 50% was on counseling and review of test results     Mikey Bussing, NP 02/26/2014 12:01 PM

## 2014-03-05 ENCOUNTER — Other Ambulatory Visit: Payer: Self-pay | Admitting: *Deleted

## 2014-03-05 ENCOUNTER — Ambulatory Visit (HOSPITAL_BASED_OUTPATIENT_CLINIC_OR_DEPARTMENT_OTHER): Payer: 59

## 2014-03-05 ENCOUNTER — Ambulatory Visit (HOSPITAL_BASED_OUTPATIENT_CLINIC_OR_DEPARTMENT_OTHER): Payer: 59 | Admitting: Oncology

## 2014-03-05 ENCOUNTER — Encounter: Payer: Self-pay | Admitting: Oncology

## 2014-03-05 VITALS — BP 137/80 | HR 96 | Temp 97.1°F | Resp 18 | Ht 63.0 in | Wt 109.9 lb

## 2014-03-05 DIAGNOSIS — C7952 Secondary malignant neoplasm of bone marrow: Secondary | ICD-10-CM

## 2014-03-05 DIAGNOSIS — D649 Anemia, unspecified: Secondary | ICD-10-CM

## 2014-03-05 DIAGNOSIS — C50919 Malignant neoplasm of unspecified site of unspecified female breast: Secondary | ICD-10-CM

## 2014-03-05 DIAGNOSIS — C50119 Malignant neoplasm of central portion of unspecified female breast: Secondary | ICD-10-CM

## 2014-03-05 DIAGNOSIS — D696 Thrombocytopenia, unspecified: Secondary | ICD-10-CM

## 2014-03-05 DIAGNOSIS — C7951 Secondary malignant neoplasm of bone: Secondary | ICD-10-CM

## 2014-03-05 DIAGNOSIS — Z5111 Encounter for antineoplastic chemotherapy: Secondary | ICD-10-CM

## 2014-03-05 DIAGNOSIS — Z95828 Presence of other vascular implants and grafts: Secondary | ICD-10-CM

## 2014-03-05 DIAGNOSIS — G8929 Other chronic pain: Secondary | ICD-10-CM

## 2014-03-05 LAB — MANUAL DIFFERENTIAL
ALC: 1.5 10*3/uL (ref 0.9–3.3)
ANC (CHCC manual diff): 4.5 10*3/uL (ref 1.5–6.5)
Band Neutrophils: 15 % — ABNORMAL HIGH (ref 0–10)
Basophil: 6 % — ABNORMAL HIGH (ref 0–2)
Blasts: 1 % — ABNORMAL HIGH (ref 0–0)
EOS%: 2 % (ref 0–7)
LYMPH: 20 % (ref 14–49)
METAMYELOCYTES PCT: 15 % — AB (ref 0–0)
MONO: 11 % (ref 0–14)
Myelocytes: 11 % — ABNORMAL HIGH (ref 0–0)
OTHER CELL: 0 % (ref 0–0)
PLT EST: DECREASED
PROMYELO: 0 % (ref 0–0)
SEG: 19 % — ABNORMAL LOW (ref 38–77)
Variant Lymph: 0 % (ref 0–0)
nRBC: 32 % — ABNORMAL HIGH (ref 0–0)

## 2014-03-05 LAB — COMPREHENSIVE METABOLIC PANEL (CC13)
ALT: 14 U/L (ref 0–55)
AST: 41 U/L — AB (ref 5–34)
Albumin: 3.7 g/dL (ref 3.5–5.0)
Alkaline Phosphatase: 83 U/L (ref 40–150)
Anion Gap: 10 mEq/L (ref 3–11)
BUN: 15.3 mg/dL (ref 7.0–26.0)
CO2: 25 mEq/L (ref 22–29)
Calcium: 9.3 mg/dL (ref 8.4–10.4)
Chloride: 105 mEq/L (ref 98–109)
Creatinine: 0.7 mg/dL (ref 0.6–1.1)
Glucose: 105 mg/dl (ref 70–140)
POTASSIUM: 4.4 meq/L (ref 3.5–5.1)
Sodium: 140 mEq/L (ref 136–145)
TOTAL PROTEIN: 6.9 g/dL (ref 6.4–8.3)
Total Bilirubin: 0.57 mg/dL (ref 0.20–1.20)

## 2014-03-05 LAB — CBC WITH DIFFERENTIAL/PLATELET
HCT: 29.6 % — ABNORMAL LOW (ref 34.8–46.6)
HGB: 9 g/dL — ABNORMAL LOW (ref 11.6–15.9)
MCH: 28.2 pg (ref 25.1–34.0)
MCHC: 30.4 g/dL — ABNORMAL LOW (ref 31.5–36.0)
MCV: 92.8 fL (ref 79.5–101.0)
Platelets: 27 10*3/uL — ABNORMAL LOW (ref 145–400)
RBC: 3.19 10*6/uL — AB (ref 3.70–5.45)
RDW: 19.3 % — ABNORMAL HIGH (ref 11.2–14.5)
WBC: 7.6 10*3/uL (ref 3.9–10.3)

## 2014-03-05 MED ORDER — SODIUM CHLORIDE 0.9 % IV SOLN
60.0000 mg/m2 | Freq: Once | INTRAVENOUS | Status: AC
  Administered 2014-03-05: 84 mg via INTRAVENOUS
  Filled 2014-03-05: qty 14

## 2014-03-05 MED ORDER — SODIUM CHLORIDE 0.9 % IJ SOLN
10.0000 mL | INTRAMUSCULAR | Status: DC | PRN
  Filled 2014-03-05: qty 10

## 2014-03-05 MED ORDER — ONDANSETRON 8 MG/NS 50 ML IVPB
INTRAVENOUS | Status: AC
Start: 2014-03-05 — End: 2014-03-05
  Filled 2014-03-05: qty 8

## 2014-03-05 MED ORDER — HEPARIN SOD (PORK) LOCK FLUSH 100 UNIT/ML IV SOLN
500.0000 [IU] | Freq: Once | INTRAVENOUS | Status: AC
  Administered 2014-03-05: 500 [IU] via INTRAVENOUS
  Filled 2014-03-05: qty 5

## 2014-03-05 MED ORDER — FAMOTIDINE IN NACL 20-0.9 MG/50ML-% IV SOLN
INTRAVENOUS | Status: AC
  Filled 2014-03-05: qty 50

## 2014-03-05 MED ORDER — DIPHENHYDRAMINE HCL 50 MG/ML IJ SOLN
25.0000 mg | Freq: Once | INTRAMUSCULAR | Status: AC
  Administered 2014-03-05: 25 mg via INTRAVENOUS

## 2014-03-05 MED ORDER — HEPARIN SOD (PORK) LOCK FLUSH 100 UNIT/ML IV SOLN
500.0000 [IU] | Freq: Once | INTRAVENOUS | Status: AC | PRN
  Administered 2014-03-05: 500 [IU]
  Filled 2014-03-05: qty 5

## 2014-03-05 MED ORDER — FAMOTIDINE IN NACL 20-0.9 MG/50ML-% IV SOLN
20.0000 mg | Freq: Once | INTRAVENOUS | Status: AC
  Administered 2014-03-05: 20 mg via INTRAVENOUS

## 2014-03-05 MED ORDER — DEXAMETHASONE SODIUM PHOSPHATE 20 MG/5ML IJ SOLN
INTRAMUSCULAR | Status: AC
  Filled 2014-03-05: qty 5

## 2014-03-05 MED ORDER — SODIUM CHLORIDE 0.9 % IV SOLN
Freq: Once | INTRAVENOUS | Status: AC
  Administered 2014-03-05: 12:00:00 via INTRAVENOUS

## 2014-03-05 MED ORDER — DEXAMETHASONE SODIUM PHOSPHATE 20 MG/5ML IJ SOLN
20.0000 mg | Freq: Once | INTRAMUSCULAR | Status: AC
Start: 2014-03-05 — End: 2014-03-05
  Administered 2014-03-05: 20 mg via INTRAVENOUS

## 2014-03-05 MED ORDER — ONDANSETRON 8 MG/50ML IVPB (CHCC)
8.0000 mg | Freq: Once | INTRAVENOUS | Status: AC
  Administered 2014-03-05: 8 mg via INTRAVENOUS

## 2014-03-05 MED ORDER — SODIUM CHLORIDE 0.9 % IJ SOLN
10.0000 mL | INTRAMUSCULAR | Status: DC | PRN
  Administered 2014-03-05 (×2): 10 mL via INTRAVENOUS
  Filled 2014-03-05: qty 10

## 2014-03-05 MED ORDER — DIPHENHYDRAMINE HCL 50 MG/ML IJ SOLN
INTRAMUSCULAR | Status: AC
  Filled 2014-03-05: qty 1

## 2014-03-05 NOTE — Patient Instructions (Signed)
[facility] Discharge Instructions for Patients Receiving Chemotherapy  Today you received the following chemotherapy agents taxol  To help prevent nausea and vomiting after your treatment, we encourage you to take your nausea medication as directed   If you develop nausea and vomiting that is not controlled by your nausea medication, call the clinic.   BELOW ARE SYMPTOMS THAT SHOULD BE REPORTED IMMEDIATELY:  *FEVER GREATER THAN 100.5 F  *CHILLS WITH OR WITHOUT FEVER  NAUSEA AND VOMITING THAT IS NOT CONTROLLED WITH YOUR NAUSEA MEDICATION  *UNUSUAL SHORTNESS OF BREATH  *UNUSUAL BRUISING OR BLEEDING  TENDERNESS IN MOUTH AND THROAT WITH OR WITHOUT PRESENCE OF ULCERS  *URINARY PROBLEMS  *BOWEL PROBLEMS  UNUSUAL RASH Items with * indicate a potential emergency and should be followed up as soon as possible.  Feel free to call the clinic you have any questions or concerns. The clinic phone number is [phone].  

## 2014-03-05 NOTE — Patient Instructions (Signed)

## 2014-03-05 NOTE — Progress Notes (Signed)
[name] OFFICE PROGRESS NOTE  Patient Care Team: [name], MD as PCP - General (Family Medicine) Dr. 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 Diagnosed in October 2007 as T1N1   SUMMARY OF ONCOLOGIC HISTORY: #1 patient presented with a mass in her right breast in October 2007. She underwent bilateral mammograms. The right breast ultrasound performed on 08/19/2006 showed increased density and distortion in the subareolar location of the right breast. Ultrasound showed abnormal echoes in the subareolar location which was a change measuring 1.5 cm. Patient underwent an ultrasound-guided biopsy on 08/23/2006 which showed invasive mammary carcinoma with lobular features. ER +100% PR 3% proliferation marker Ki-67 12% HER-2/neu 2+ by fish. MRI of the breasts on 08/30/2006 showed an ill-defined enhancing mass in the right breast measuring 2.6 x 4.0 x 4.1 cm. Enhancement trailed out to the nipple but did not involve the nipple. There was also a small enhancing internal mammary node measuring 7 mm suspicious for metastatic disease. Left breast was negative.  #2 patient was seen by Dr. Eston Esters on 09/09/2006. She received neoadjuvant chemotherapy initially consisting of FEC regimen given dose dense for 4 cycles followed by Taxotere. She received her chemotherapy she received her chemotherapy from 09/20/2006 through February 2008. She then went on to lumpectomy on 02/07/2007. However there was essentially residual tumor dispersed over an area 1.8 cm with some close surgical margins at the medial area. Her case was reviewed at the breast conference and it was suggested she undergo mastectomy. Patient underwent a mastectomy on 03/19/2007.  #3 she was subsequently seen by radiation oncology for consideration of post mastectomy radiation therapy. However, she was not given  radiation.  #4 she was then begun on Femara 2.5 mg daily. She has now completed 5 years of therapy. It was discontinued in June 2014.  #5 Returned to clinic on 08/05/13 for evaluation and management of new anemia. She has underwent PET/CT chest abdomen and pelvis which reveals diffuse lytic lesions throughout the axial and appendicular skeleton. Bone marrow biopsy was consistent with metastatic ER/PR positive breast cancer.  #6 status post Arimidex  #7 palliative single agent Taxol being given weekly until patient has progression of disease  CURRENT THERAPY: Proceed with cycle 16 of dose attenuated Taxol  INTERVAL HISTORY: Gina [name] 68 y.o. female returns for followup visit prior to cycle 15 of her chemotherapy consisting of Taxol weekly. Clinically she looks terrific. She continues to eat well. She continues to gain weight. She denies having any nausea or vomiting abdominal pain. She has no musculoskeletal pain reported today. She has no peripheral paresthesias. She denies any myalgias and arthralgias. She has no bleeding. She denies any epistaxis bleeding from her gowns no cough hemoptysis hematemesis hematuria hematochezia or easy bruising. Her platelet count today is 27,000. [name] is negative and as below.  I have reviewed the past medical history, past surgical history, social history and family history with the patient and they are unchanged from previous note.  ALLERGIES:  is allergic to sudafed.  MEDICATIONS:  Current Outpatient Prescriptions  Medication Sig Dispense Refill  . cetirizine (ZYRTEC) 10 MG tablet Take 10 mg by mouth daily.        . Cholecalciferol (VITAMIN D3) 5000 UNITS TABS Take 5,000 mg by mouth daily.      Marland Kitchen dronabinol (MARINOL) 2.5 MG capsule Take 1 capsule (  2.5 mg total) by mouth 2 (two) times daily before a meal.  60 capsule  0  . lidocaine-prilocaine (EMLA) cream Apply 1 application topically as needed.  30 g  1  . [place]  Mushroom POWD 5 mLs by Does not apply route daily.      . Multiple Vitamins-Minerals (OSTEO COMPLEX PO) Take by mouth.      Marland Kitchen omeprazole (PRILOSEC) 40 MG capsule Take 40 mg by mouth daily.      Marland Kitchen venlafaxine XR (EFFEXOR-XR) 37.5 MG 24 hr capsule Take 1 capsule (37.5 mg total) by mouth daily with breakfast.  30 capsule  9  . HYDROcodone-acetaminophen (NORCO/VICODIN) 5-325 MG per tablet Take 1 tablet by mouth every 6 (six) hours as needed for moderate pain.  30 tablet  0  . meclizine (ANTIVERT) 25 MG tablet Take 1 tablet (25 mg total) by mouth 3 (three) times daily as needed for dizziness.  30 tablet  0  . mirtazapine (REMERON) 15 MG tablet       . ondansetron (ZOFRAN) 4 MG tablet Take 1 tablet (4 mg total) by mouth every 6 (six) hours as needed for nausea.  20 tablet  0  . traMADol (ULTRAM) 50 MG tablet Take 0.5-1 tablets (25-50 mg total) by mouth every 6 (six) hours as needed for pain.  120 tablet  0   No current facility-administered medications for this visit.   Facility-Administered Medications Ordered in Other Visits  Medication Dose Route Frequency Provider Last Rate Last Dose  . sodium chloride 0.9 % injection 10 mL  10 mL Intravenous PRN Amy G Berry, PA-C   10 mL at 03/05/14 1344  . sodium chloride 0.9 % injection 10 mL  10 mL Intracatheter PRN Maryanna Shape, NP        REVIEW OF SYSTEMS:   Constitutional: Denies fevers, chills or abnormal weight loss Eyes: Denies blurriness of vision Ears, nose, mouth, throat, and face: Denies mucositis or sore throat Respiratory: Denies cough, dyspnea or wheezes Cardiovascular: Denies palpitation, chest discomfort or lower extremity swelling Gastrointestinal:  Denies nausea, heartburn or change in bowel habits Skin: Denies abnormal skin rashes Lymphatics: Denies new lymphadenopathy or easy bruising Neurological:Denies numbness, tingling or new weaknesses Behavioral/Psych: Mood is stable, no new changes  All other systems were reviewed with the  patient and are negative.  PHYSICAL EXAMINATION: ECOG PERFORMANCE STATUS: 1 - Symptomatic but completely ambulatory  Filed Vitals:   03/05/14 1046  BP: 137/80  Pulse: 96  Temp: 97.1 F (36.2 C)  Resp: 18   Filed Weights   03/05/14 1046  Weight: 109 lb 14.4 oz (49.85 kg)    GENERAL:alert, no distress and comfortable SKIN: skin color, texture, turgor are normal, no rashes or significant lesions EYES: normal, Conjunctiva are pink and non-injected, sclera clear OROPHARYNX:no exudate, no erythema and lips, buccal mucosa, and tongue normal  NECK: supple, thyroid normal size, non-tender, without nodularity LYMPH:  no palpable lymphadenopathy in the cervical, axillary or inguinal LUNGS: clear to auscultation and percussion with normal breathing effort HEART: regular rate & rhythm and no murmurs and no lower extremity edema ABDOMEN:abdomen soft, non-tender and normal bowel sounds Musculoskeletal:no cyanosis of digits and no clubbing  NEURO: alert & oriented x 3 with fluent speech, no focal motor/sensory deficits Port site: Looks clean without any evidence of infections.  LABORATORY DATA:  I have reviewed the data    RADIOGRAPHIC STUDIES: I have personally reviewed the radiological images as listed and agreed with the findings in the  report. No results found.    ASSESSMENT & PLAN:  68 year old female with  #1 stage IV metastatic invasive lobular carcinoma. She was originally diagnosed in 2007. She is currently receiving palliative chemotherapy consisting of weekly Taxol which is dose attenuated. She continues to tolerate this very well. She has minimal side effects from it. I have reviewed CBC with Dr Alen Blew in Dr Laurelyn Sickle absence today. Patient will proceed with cycle 16 of Taxol. I reviewed her labs with her today. Patient will eventually need restaging CT scans. We will plan on doing these sometime in April 2015. Will defer to Dr Humphrey Rolls to order upon her return.  #2 anemia: This is  stable. Her hemoglobin is 9.0. She has not had any bleeding problems she is asymptomatic from this. We will continue to observe.  #3 thrombocytopenia: Platelets 27,000. No bleeding or bruising. This is adequate enough to proceed with treatment. The patient and her husband have asked again about this today. 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 The patient and her husband understand this.   #4 chronic pain: Patient is without any pain. She does have prescriptions for hydrocodone and tramadol. She has not been taking these because she has been asymptomatic  #5 bone metastasis: Last Xgeva given on 02/26/14. She understands the risks benefits and side effects as well as complications of this drug.  #6 followup: Patient will be seen back in one week for next dose of Taxol.    All questions were answered. The patient knows to call the clinic with any problems, questions or concerns. No barriers to learning was detected. I spent 25 minutes counseling the patient face to face. The total time spent in the appointment was 30 minutes and more than 50% was on counseling and review of test results     Maryanna Shape, NP 03/05/2014 1:59 PM

## 2014-03-08 ENCOUNTER — Ambulatory Visit: Payer: 59 | Admitting: Oncology

## 2014-03-08 ENCOUNTER — Other Ambulatory Visit: Payer: 59

## 2014-03-10 ENCOUNTER — Telehealth: Payer: Self-pay | Admitting: Oncology

## 2014-03-10 NOTE — Telephone Encounter (Signed)
added f/u appt for 4/17 @ 9am w/KK per desk nurse (terri - see list of pt's for kk to be r/s). s/w pt she is aware of new time for lb/fu/tx 4/17 @ 8:30am.

## 2014-03-11 ENCOUNTER — Encounter: Payer: Self-pay | Admitting: Oncology

## 2014-03-11 NOTE — Progress Notes (Signed)
Insurance paying.

## 2014-03-12 ENCOUNTER — Ambulatory Visit (HOSPITAL_BASED_OUTPATIENT_CLINIC_OR_DEPARTMENT_OTHER): Payer: 59

## 2014-03-12 ENCOUNTER — Encounter: Payer: Self-pay | Admitting: Oncology

## 2014-03-12 ENCOUNTER — Other Ambulatory Visit (HOSPITAL_BASED_OUTPATIENT_CLINIC_OR_DEPARTMENT_OTHER): Payer: 59

## 2014-03-12 ENCOUNTER — Ambulatory Visit (HOSPITAL_BASED_OUTPATIENT_CLINIC_OR_DEPARTMENT_OTHER): Payer: 59 | Admitting: Oncology

## 2014-03-12 VITALS — BP 125/75 | HR 112 | Temp 98.4°F | Resp 18 | Ht 63.0 in | Wt 111.7 lb

## 2014-03-12 DIAGNOSIS — C50119 Malignant neoplasm of central portion of unspecified female breast: Secondary | ICD-10-CM

## 2014-03-12 DIAGNOSIS — Z5111 Encounter for antineoplastic chemotherapy: Secondary | ICD-10-CM

## 2014-03-12 DIAGNOSIS — C7952 Secondary malignant neoplasm of bone marrow: Secondary | ICD-10-CM

## 2014-03-12 DIAGNOSIS — C50919 Malignant neoplasm of unspecified site of unspecified female breast: Secondary | ICD-10-CM

## 2014-03-12 DIAGNOSIS — D649 Anemia, unspecified: Secondary | ICD-10-CM

## 2014-03-12 DIAGNOSIS — G8929 Other chronic pain: Secondary | ICD-10-CM

## 2014-03-12 DIAGNOSIS — C7951 Secondary malignant neoplasm of bone: Secondary | ICD-10-CM

## 2014-03-12 DIAGNOSIS — D696 Thrombocytopenia, unspecified: Secondary | ICD-10-CM

## 2014-03-12 LAB — COMPREHENSIVE METABOLIC PANEL (CC13)
ALT: 14 U/L (ref 0–55)
AST: 53 U/L — ABNORMAL HIGH (ref 5–34)
Albumin: 3.5 g/dL (ref 3.5–5.0)
Alkaline Phosphatase: 82 U/L (ref 40–150)
Anion Gap: 10 meq/L (ref 3–11)
BUN: 16.7 mg/dL (ref 7.0–26.0)
CO2: 25 meq/L (ref 22–29)
Calcium: 9.7 mg/dL (ref 8.4–10.4)
Chloride: 105 meq/L (ref 98–109)
Creatinine: 0.7 mg/dL (ref 0.6–1.1)
Glucose: 132 mg/dL (ref 70–140)
Potassium: 4 meq/L (ref 3.5–5.1)
Sodium: 140 meq/L (ref 136–145)
Total Bilirubin: 0.48 mg/dL (ref 0.20–1.20)
Total Protein: 6.7 g/dL (ref 6.4–8.3)

## 2014-03-12 LAB — CBC WITH DIFFERENTIAL/PLATELET
HCT: 29.8 % — ABNORMAL LOW (ref 34.8–46.6)
HGB: 9.3 g/dL — ABNORMAL LOW (ref 11.6–15.9)
MCH: 28.8 pg (ref 25.1–34.0)
MCHC: 31.2 g/dL — ABNORMAL LOW (ref 31.5–36.0)
MCV: 92.3 fL (ref 79.5–101.0)
PLATELETS: 25 10*3/uL — AB (ref 145–400)
RBC: 3.23 10*6/uL — ABNORMAL LOW (ref 3.70–5.45)
RDW: 19.1 % — AB (ref 11.2–14.5)
WBC: 6.4 10*3/uL (ref 3.9–10.3)

## 2014-03-12 LAB — MANUAL DIFFERENTIAL
ALC: 1.4 10*3/uL (ref 0.9–3.3)
ANC (CHCC MAN DIFF): 3 10*3/uL (ref 1.5–6.5)
BAND NEUTROPHILS: 19 % — AB (ref 0–10)
BLASTS: 4 % — AB (ref 0–0)
Basophil: 3 % — ABNORMAL HIGH (ref 0–2)
EOS%: 5 % (ref 0–7)
LYMPH: 22 % (ref 14–49)
MONO: 16 % — ABNORMAL HIGH (ref 0–14)
MYELOCYTES: 12 % — AB (ref 0–0)
Metamyelocytes: 3 % — ABNORMAL HIGH (ref 0–0)
NRBC: 38 % — AB (ref 0–0)
Other Cell: 0 % (ref 0–0)
PLT EST: DECREASED
PROMYELO: 3 % — AB (ref 0–0)
SEG: 13 % — ABNORMAL LOW (ref 38–77)
Variant Lymph: 0 % (ref 0–0)

## 2014-03-12 MED ORDER — HEPARIN SOD (PORK) LOCK FLUSH 100 UNIT/ML IV SOLN
500.0000 [IU] | Freq: Once | INTRAVENOUS | Status: AC | PRN
  Administered 2014-03-12: 500 [IU]
  Filled 2014-03-12: qty 5

## 2014-03-12 MED ORDER — DEXAMETHASONE SODIUM PHOSPHATE 20 MG/5ML IJ SOLN
20.0000 mg | Freq: Once | INTRAMUSCULAR | Status: AC
  Administered 2014-03-12: 20 mg via INTRAVENOUS

## 2014-03-12 MED ORDER — SODIUM CHLORIDE 0.9 % IV SOLN
60.0000 mg/m2 | Freq: Once | INTRAVENOUS | Status: AC
  Administered 2014-03-12: 84 mg via INTRAVENOUS
  Filled 2014-03-12: qty 14

## 2014-03-12 MED ORDER — DEXAMETHASONE SODIUM PHOSPHATE 20 MG/5ML IJ SOLN
INTRAMUSCULAR | Status: AC
  Filled 2014-03-12: qty 5

## 2014-03-12 MED ORDER — DIPHENHYDRAMINE HCL 50 MG/ML IJ SOLN
25.0000 mg | Freq: Once | INTRAMUSCULAR | Status: AC
  Administered 2014-03-12: 25 mg via INTRAVENOUS

## 2014-03-12 MED ORDER — SODIUM CHLORIDE 0.9 % IV SOLN
Freq: Once | INTRAVENOUS | Status: AC
  Administered 2014-03-12: 10:00:00 via INTRAVENOUS

## 2014-03-12 MED ORDER — ONDANSETRON 8 MG/50ML IVPB (CHCC)
8.0000 mg | Freq: Once | INTRAVENOUS | Status: AC
  Administered 2014-03-12: 8 mg via INTRAVENOUS

## 2014-03-12 MED ORDER — FAMOTIDINE IN NACL 20-0.9 MG/50ML-% IV SOLN
INTRAVENOUS | Status: AC
  Filled 2014-03-12: qty 50

## 2014-03-12 MED ORDER — DIPHENHYDRAMINE HCL 50 MG/ML IJ SOLN
INTRAMUSCULAR | Status: AC
  Filled 2014-03-12: qty 1

## 2014-03-12 MED ORDER — SODIUM CHLORIDE 0.9 % IJ SOLN
10.0000 mL | INTRAMUSCULAR | Status: DC | PRN
  Administered 2014-03-12: 10 mL
  Filled 2014-03-12: qty 10

## 2014-03-12 MED ORDER — FAMOTIDINE IN NACL 20-0.9 MG/50ML-% IV SOLN
20.0000 mg | Freq: Once | INTRAVENOUS | Status: AC
  Administered 2014-03-12: 20 mg via INTRAVENOUS

## 2014-03-12 MED ORDER — ONDANSETRON 8 MG/NS 50 ML IVPB
INTRAVENOUS | Status: AC
  Filled 2014-03-12: qty 8

## 2014-03-12 NOTE — Patient Instructions (Signed)
Proceed with scheduled chemotherapy today.  I have set you up for CT scans in the next few weeks.  #3 I will see you back in one week for next dose of chemotherapy.

## 2014-03-12 NOTE — Progress Notes (Signed)
Charge nurse Colletta Maryland received order to traet patient today despite counts.  Communicated this order to this nurse and proceeding with today's taxol treatment.

## 2014-03-12 NOTE — Progress Notes (Deleted)
[place] OFFICE PROGRESS NOTE  Patient Care Team: [name], MD as PCP - General (Family Medicine)  DIAGNOSIS:   SUMMARY OF ONCOLOGIC HISTORY:  No history exists.    INTERVAL HISTORY: Gina [name] 68 y.o. female returns for   I have reviewed the past medical history, past surgical history, social history and family history with the patient and they are unchanged from previous note.  ALLERGIES:  is allergic to sudafed.  MEDICATIONS:  Current Outpatient Prescriptions  Medication Sig Dispense Refill  . cetirizine (ZYRTEC) 10 MG tablet Take 10 mg by mouth daily.        . Cholecalciferol (VITAMIN D3) 5000 UNITS TABS Take 5,000 mg by mouth daily.      Marland Kitchen dronabinol (MARINOL) 2.5 MG capsule Take 1 capsule (2.5 mg total) by mouth 2 (two) times daily before a meal.  60 capsule  0  . HYDROcodone-acetaminophen (NORCO/VICODIN) 5-325 MG per tablet Take 1 tablet by mouth every 6 (six) hours as needed for moderate pain.  30 tablet  0  . lidocaine-prilocaine (EMLA) cream Apply 1 application topically as needed.  30 g  1  . [place] Mushroom POWD 5 mLs by Does not apply route daily.      . meclizine (ANTIVERT) 25 MG tablet Take 1 tablet (25 mg total) by mouth 3 (three) times daily as needed for dizziness.  30 tablet  0  . mirtazapine (REMERON) 15 MG tablet       . Multiple Vitamins-Minerals (OSTEO COMPLEX PO) Take by mouth.      Marland Kitchen omeprazole (PRILOSEC) 40 MG capsule Take 40 mg by mouth daily.      . ondansetron (ZOFRAN) 4 MG tablet Take 1 tablet (4 mg total) by mouth every 6 (six) hours as needed for nausea.  20 tablet  0  . traMADol (ULTRAM) 50 MG tablet Take 0.5-1 tablets (25-50 mg total) by mouth every 6 (six) hours as needed for pain.  120 tablet  0  . venlafaxine XR (EFFEXOR-XR) 37.5 MG 24 hr capsule Take 1 capsule (37.5 mg total) by mouth daily with breakfast.  30 capsule  9   No current facility-administered medications for this visit.   Facility-Administered  Medications Ordered in Other Visits  Medication Dose Route Frequency Provider Last Rate Last Dose  . sodium chloride 0.9 % injection 10 mL  10 mL Intravenous PRN Amy Milda Smart, PA-C   10 mL at 03/05/14 1344    REVIEW OF SYSTEMS:   Constitutional: Denies fevers, chills or abnormal weight loss Eyes: Denies blurriness of vision Ears, nose, mouth, throat, and face: Denies mucositis or sore throat Respiratory: Denies cough, dyspnea or wheezes Cardiovascular: Denies palpitation, chest discomfort or lower extremity swelling Gastrointestinal:  Denies nausea, heartburn or change in bowel habits Skin: Denies abnormal skin rashes Lymphatics: Denies new lymphadenopathy or easy bruising Neurological:Denies numbness, tingling or new weaknesses Behavioral/Psych: Mood is stable, no new changes  All other systems were reviewed with the patient and are negative.  PHYSICAL EXAMINATION: ECOG PERFORMANCE STATUS: {CHL ONC ECOG FT:[id number]}  Filed Vitals:   03/12/14 0831  BP: 125/75  Pulse: 112  Temp: 98.4 F (36.9 C)  Resp: 18   Filed Weights   03/12/14 0831  Weight: 111 lb 11.2 oz (50.667 kg)    GENERAL:alert, no distress and comfortable SKIN: skin color, texture, turgor are normal, no rashes or significant lesions EYES: normal, Conjunctiva are pink and non-injected, sclera clear OROPHARYNX:no exudate, no erythema and lips, buccal mucosa,  and tongue normal  NECK: supple, thyroid normal size, non-tender, without nodularity LYMPH:  no palpable lymphadenopathy in the cervical, axillary or inguinal LUNGS: clear to auscultation and percussion with normal breathing effort HEART: regular rate & rhythm and no murmurs and no lower extremity edema ABDOMEN:abdomen soft, non-tender and normal bowel sounds Musculoskeletal:no cyanosis of digits and no clubbing  NEURO: alert & oriented x 3 with fluent speech, no focal motor/sensory deficits  LABORATORY DATA:  I have reviewed the data as listed     Component Value Date/Time   NA 140 03/05/2014 1018   NA 140 11/04/2013 0454   K 4.4 03/05/2014 1018   K 3.8 11/04/2013 0454   CL 108 11/04/2013 0454   CL 105 04/17/2013 1323   CO2 25 03/05/2014 1018   CO2 23 11/04/2013 0454   GLUCOSE 105 03/05/2014 1018   GLUCOSE 97 11/04/2013 0454   GLUCOSE 84 04/17/2013 1323   BUN 15.3 03/05/2014 1018   BUN 8 11/04/2013 0454   CREATININE 0.7 03/05/2014 1018   CREATININE 0.43* 11/04/2013 0454   CALCIUM 9.3 03/05/2014 1018   CALCIUM 7.3* 11/04/2013 0454   PROT 6.9 03/05/2014 1018   PROT 7.1 11/03/2013 0548   ALBUMIN 3.7 03/05/2014 1018   ALBUMIN 3.4* 11/03/2013 0548   AST 41* 03/05/2014 1018   AST 31 11/03/2013 0548   ALT 14 03/05/2014 1018   ALT 13 11/03/2013 0548   ALKPHOS 83 03/05/2014 1018   ALKPHOS 224* 11/03/2013 0548   BILITOT 0.57 03/05/2014 1018   BILITOT 0.4 11/03/2013 0548   GFRNONAA >90 11/04/2013 0454   GFRAA >90 11/04/2013 0454    No results found for this basename: SPEP, UPEP,  kappa and lambda light chains    Lab Results  Component Value Date   WBC 6.4 03/12/2014   NEUTROABS 3.3 12/01/2013   HGB 9.3* 03/12/2014   HCT 29.8* 03/12/2014   MCV 92.3 03/12/2014   PLT 25* 03/12/2014      Chemistry      Component Value Date/Time   NA 140 03/05/2014 1018   NA 140 11/04/2013 0454   K 4.4 03/05/2014 1018   K 3.8 11/04/2013 0454   CL 108 11/04/2013 0454   CL 105 04/17/2013 1323   CO2 25 03/05/2014 1018   CO2 23 11/04/2013 0454   BUN 15.3 03/05/2014 1018   BUN 8 11/04/2013 0454   CREATININE 0.7 03/05/2014 1018   CREATININE 0.43* 11/04/2013 0454      Component Value Date/Time   CALCIUM 9.3 03/05/2014 1018   CALCIUM 7.3* 11/04/2013 0454   ALKPHOS 83 03/05/2014 1018   ALKPHOS 224* 11/03/2013 0548   AST 41* 03/05/2014 1018   AST 31 11/03/2013 0548   ALT 14 03/05/2014 1018   ALT 13 11/03/2013 0548   BILITOT 0.57 03/05/2014 1018   BILITOT 0.4 11/03/2013 0548       RADIOGRAPHIC STUDIES: I have personally reviewed the radiological images as listed  and agreed with the findings in the report. No results found.    ASSESSMENT & PLAN:  ***  No orders of the defined types were placed in this encounter.   All questions were answered. The patient knows to call the clinic with any problems, questions or concerns. No barriers to learning was detected. I spent {CHL ONC TIME VISIT - IEPPI:[id number]} counseling the patient face to face. The total time spent in the appointment was {CHL ONC TIME VISIT - YTKZS:[id number]} and more than 50% was on counseling and review of  test results     Deatra Robinson, MD 03/12/2014 9:14 AM

## 2014-03-12 NOTE — Patient Instructions (Signed)
[facility] Discharge Instructions for Patients Receiving Chemotherapy  Today you received the following chemotherapy agents: Taxol.  To help prevent nausea and vomiting after your treatment, we encourage you to take your nausea medication as prescribed.   If you develop nausea and vomiting that is not controlled by your nausea medication, call the clinic.   BELOW ARE SYMPTOMS THAT SHOULD BE REPORTED IMMEDIATELY:  *FEVER GREATER THAN 100.5 F  *CHILLS WITH OR WITHOUT FEVER  NAUSEA AND VOMITING THAT IS NOT CONTROLLED WITH YOUR NAUSEA MEDICATION  *UNUSUAL SHORTNESS OF BREATH  *UNUSUAL BRUISING OR BLEEDING  TENDERNESS IN MOUTH AND THROAT WITH OR WITHOUT PRESENCE OF ULCERS  *URINARY PROBLEMS  *BOWEL PROBLEMS  UNUSUAL RASH Items with * indicate a potential emergency and should be followed up as soon as possible.  Feel free to call the clinic you have any questions or concerns. The clinic phone number is [phone].    

## 2014-03-12 NOTE — Progress Notes (Signed)
Discharged with friend at 1220

## 2014-03-12 NOTE — Progress Notes (Signed)
Ok to treat with platelets 25 per Dr. Humphrey Rolls

## 2014-03-15 ENCOUNTER — Encounter: Payer: Self-pay | Admitting: Oncology

## 2014-03-15 NOTE — Progress Notes (Signed)
Called and left the patient a message to call back. She wants estimate on her treatment.

## 2014-03-16 ENCOUNTER — Ambulatory Visit (HOSPITAL_COMMUNITY)
Admission: RE | Admit: 2014-03-16 | Discharge: 2014-03-16 | Disposition: A | Discharge status: Home / Self Care | Payer: 59 | Source: Ambulatory Visit | Attending: Oncology | Admitting: Oncology

## 2014-03-16 ENCOUNTER — Encounter: Payer: Self-pay | Admitting: Oncology

## 2014-03-16 ENCOUNTER — Encounter (HOSPITAL_COMMUNITY): Payer: Self-pay

## 2014-03-16 DIAGNOSIS — Z9221 Personal history of antineoplastic chemotherapy: Secondary | ICD-10-CM | POA: Insufficient documentation

## 2014-03-16 DIAGNOSIS — Z9010 Acquired absence of unspecified breast and nipple: Secondary | ICD-10-CM | POA: Insufficient documentation

## 2014-03-16 DIAGNOSIS — R091 Pleurisy: Secondary | ICD-10-CM | POA: Insufficient documentation

## 2014-03-16 DIAGNOSIS — D259 Leiomyoma of uterus, unspecified: Secondary | ICD-10-CM | POA: Insufficient documentation

## 2014-03-16 DIAGNOSIS — C50919 Malignant neoplasm of unspecified site of unspecified female breast: Secondary | ICD-10-CM | POA: Insufficient documentation

## 2014-03-16 DIAGNOSIS — M948X9 Other specified disorders of cartilage, unspecified sites: Secondary | ICD-10-CM | POA: Insufficient documentation

## 2014-03-16 DIAGNOSIS — E278 Other specified disorders of adrenal gland: Secondary | ICD-10-CM | POA: Insufficient documentation

## 2014-03-16 MED ORDER — IOHEXOL 300 MG/ML  SOLN
80.0000 mL | Freq: Once | INTRAMUSCULAR | Status: AC | PRN
  Administered 2014-03-16: 80 mL via INTRAVENOUS

## 2014-03-16 NOTE — Progress Notes (Signed)
Called and left a message for patient at home.I called the cell and spoke with hubby. He will have her call me back. She was inq about cost of treatments.

## 2014-03-17 ENCOUNTER — Other Ambulatory Visit: Payer: Self-pay | Admitting: Emergency Medicine

## 2014-03-17 ENCOUNTER — Telehealth: Payer: Self-pay | Admitting: *Deleted

## 2014-03-17 NOTE — Telephone Encounter (Signed)
Message copied by Hebert Soho on Wed Mar 17, 2014  4:12 PM ------      Message from: Minette Headland      Created: Wed Mar 17, 2014  1:00 AM       Please call patient with results.  It demonstrates that she is responding to treatment.            Thanks, [place]      ----- Message -----         From: Rad Results In Interface         Sent: 03/16/2014   5:10 PM           To: Deatra Robinson, MD                   ------

## 2014-03-17 NOTE — Telephone Encounter (Signed)
As noted below by Mendel Ryder, NP, I informed patient with results and it demonstrates that she is responding to treatment. This message was left on her answering machine.

## 2014-03-18 ENCOUNTER — Ambulatory Visit (HOSPITAL_BASED_OUTPATIENT_CLINIC_OR_DEPARTMENT_OTHER): Payer: 59 | Admitting: Oncology

## 2014-03-18 ENCOUNTER — Telehealth: Payer: Self-pay | Admitting: Oncology

## 2014-03-18 ENCOUNTER — Ambulatory Visit (HOSPITAL_BASED_OUTPATIENT_CLINIC_OR_DEPARTMENT_OTHER): Payer: 59

## 2014-03-18 ENCOUNTER — Encounter: Payer: Self-pay | Admitting: Oncology

## 2014-03-18 VITALS — BP 101/70 | HR 108 | Temp 98.8°F | Resp 18 | Ht 63.0 in | Wt 110.3 lb

## 2014-03-18 DIAGNOSIS — C50119 Malignant neoplasm of central portion of unspecified female breast: Secondary | ICD-10-CM

## 2014-03-18 DIAGNOSIS — C7952 Secondary malignant neoplasm of bone marrow: Secondary | ICD-10-CM

## 2014-03-18 DIAGNOSIS — D6959 Other secondary thrombocytopenia: Secondary | ICD-10-CM

## 2014-03-18 DIAGNOSIS — C7951 Secondary malignant neoplasm of bone: Secondary | ICD-10-CM

## 2014-03-18 DIAGNOSIS — C50919 Malignant neoplasm of unspecified site of unspecified female breast: Secondary | ICD-10-CM

## 2014-03-18 DIAGNOSIS — Z170 Estrogen receptor positive status [ER+]: Secondary | ICD-10-CM

## 2014-03-18 DIAGNOSIS — D649 Anemia, unspecified: Secondary | ICD-10-CM

## 2014-03-18 DIAGNOSIS — Z95828 Presence of other vascular implants and grafts: Secondary | ICD-10-CM

## 2014-03-18 LAB — MANUAL DIFFERENTIAL
ALC: 1.6 10*3/uL (ref 0.9–3.3)
ANC (CHCC manual diff): 2.8 10*3/uL (ref 1.5–6.5)
BAND NEUTROPHILS: 8 % (ref 0–10)
Basophil: 5 % — ABNORMAL HIGH (ref 0–2)
Blasts: 7 % — ABNORMAL HIGH (ref 0–0)
EOS%: 3 % (ref 0–7)
LYMPH: 25 % (ref 14–49)
MONO: 13 % (ref 0–14)
Metamyelocytes: 8 % — ABNORMAL HIGH (ref 0–0)
Myelocytes: 13 % — ABNORMAL HIGH (ref 0–0)
NRBC: 48 % — AB (ref 0–0)
OTHER CELL: 0 % (ref 0–0)
PLT EST: DECREASED
PROMYELO: 3 % — AB (ref 0–0)
SEG: 15 % — AB (ref 38–77)
VARIANT LYMPH: 0 % (ref 0–0)

## 2014-03-18 LAB — COMPREHENSIVE METABOLIC PANEL (CC13)
ALT: 12 U/L (ref 0–55)
AST: 59 U/L — ABNORMAL HIGH (ref 5–34)
Albumin: 3.5 g/dL (ref 3.5–5.0)
Alkaline Phosphatase: 89 U/L (ref 40–150)
Anion Gap: 10 mEq/L (ref 3–11)
BILIRUBIN TOTAL: 0.6 mg/dL (ref 0.20–1.20)
BUN: 12.5 mg/dL (ref 7.0–26.0)
CO2: 27 meq/L (ref 22–29)
CREATININE: 0.8 mg/dL (ref 0.6–1.1)
Calcium: 9.7 mg/dL (ref 8.4–10.4)
Chloride: 103 mEq/L (ref 98–109)
Glucose: 120 mg/dl (ref 70–140)
Potassium: 4.2 mEq/L (ref 3.5–5.1)
Sodium: 140 mEq/L (ref 136–145)
Total Protein: 7 g/dL (ref 6.4–8.3)

## 2014-03-18 LAB — CBC WITH DIFFERENTIAL/PLATELET
HCT: 28.2 % — ABNORMAL LOW (ref 34.8–46.6)
HEMOGLOBIN: 8.8 g/dL — AB (ref 11.6–15.9)
MCH: 28.5 pg (ref 25.1–34.0)
MCHC: 31.2 g/dL — ABNORMAL LOW (ref 31.5–36.0)
MCV: 91.3 fL (ref 79.5–101.0)
Platelets: 28 10*3/uL — ABNORMAL LOW (ref 145–400)
RBC: 3.09 10*6/uL — AB (ref 3.70–5.45)
RDW: 18.7 % — ABNORMAL HIGH (ref 11.2–14.5)
WBC: 6.3 10*3/uL (ref 3.9–10.3)

## 2014-03-18 MED ORDER — HEPARIN SOD (PORK) LOCK FLUSH 100 UNIT/ML IV SOLN
500.0000 [IU] | Freq: Once | INTRAVENOUS | Status: AC
  Administered 2014-03-18: 500 [IU] via INTRAVENOUS
  Filled 2014-03-18: qty 5

## 2014-03-18 MED ORDER — HEMOCYTE 324 (106 FE) MG PO TABS: 1.0000 | ORAL_TABLET | Freq: Every day | ORAL | Status: AC

## 2014-03-18 MED ORDER — SODIUM CHLORIDE 0.9 % IJ SOLN
10.0000 mL | INTRAMUSCULAR | Status: AC | PRN
  Administered 2014-03-18: 10 mL via INTRAVENOUS
  Filled 2014-03-18: qty 10

## 2014-03-18 NOTE — Patient Instructions (Signed)
We will try prescription Hemocyte (iron) if your insurance will cover, otherwise your pharmacist will give you ferrous gluconate or ferrous fumarate (not ferrous sulfate). Best to take on "empty" stomach, so ~ an hour before meals or ~ 2hrs after meals. Also helps absorption if you take iron with OJ or with Vitamin C tablet.

## 2014-03-18 NOTE — Progress Notes (Signed)
OFFICE PROGRESS NOTE   03/18/2014   Physicians: Antony Contras (PCP), H.Mezer  INTERVAL HISTORY:  Patient is seen in Dr Laurelyn Sickle absence, together with sister in law, in continuing attention to metastatic breast cancer, extensively involving bone with related cytopenias. She had cycle 17 weekly taxol at 80 mg/m2 on 03-12-14 and will have cycle 18 on 03-19-14. Last monthly xgeva was 02-26-14. She had restaging CT CAP on 03-16-14 which we have reviewed; she had bilateral leg pain after scan, for first time in several weeks, likely related to positioning on the CT table. The leg pain improved with prn pain medication and is not noticeable this AM.  Otherwise she has clinically continued to improve on the weekly taxol, with better appetite (Enu supplement 1-2 daily and Boost Breeze 1-2 daily in addition to other po's in small amounts regularly) and a little more activity at home (stays on sofa majority of day, but now is able to get up to get own meds and food). She has had no significant bleeding despite platelets still <30k as they have been. No other pain at all recently. No peripheral neuropathy, no nausea.  Last PRBCs 07-2013. She has PAC.   ONCOLOGIC HISTORY #1 patient presented with a subareolar mass in her right breast in October 2007, with ultrasound-guided biopsy  08/23/2006  showing invasive mammary carcinoma with lobular features. ER +100% PR 3% proliferation marker Ki-67 12% HER-2/neu 2+ by fish. MRI breasts 08/30/2006 showed an ill-defined enhancing mass in the right breast measuring 2.6 x 4.0 x 4.1 cm. Enhancement trailed out to the nipple but did not involve the nipple. There was also a small enhancing internal mammary node measuring 7 mm suspicious for metastatic disease. Left breast was negative.  #2 patient received neoadjuvant chemotherapy by Dr Eston Esters with [facility] regimen given dose dense for 4 cycles followed by Taxotere, from 09/20/2006 through February 2008. She had lumpectomy   02/07/2007. However there was essentially residual tumor dispersed over an area 1.8 cm with some close surgical margins at the medial area, and she underwent a mastectomy on 03/19/2007.  #3 she was seen by radiation oncology, did not receive radiation.  #4  Femara 2.5 mg daily, begun after chemotherapy and completed 5 years of therapy in June 2014.  #5. 08/05/13 for evaluation had new anemia. PET/CT chest abdomen and pelvis identified diffuse lytic lesions throughout the axial and appendicular skeleton. Bone marrow biopsy was consistent with metastatic ER/PR positive breast cancer.  #6 status post Arimidex  #7 palliative single agent Taxol begun weekly starting 11-06-13, cycle 17 03-12-14.    Review of systems as above, also: No increased SOB with present minimal activity, not SOB walking thru lobby today. Bowels moving, voiding ok. She cannot tell me usual good weight prior to illness. Seems stronger to sister in law compared with her last visit several weeks ago. Remainder of 10 point Review of Systems negative.  Objective:  Vital signs in last 24 hours:  BP 101/70  Pulse 108  Temp(Src) 98.8 F (37.1 C) (Oral)  Resp 18  Ht 5' 3"  (1.6 m)  Wt 110 lb 4.8 oz (50.032 kg)  BMI 19.54 kg/m2 weight is down a half lb in past week. Thin, chronically ill appearing older lady, who still looks much better overall than when I had met her in Dec. Alert, oriented and appropriate. Ambulatory slowly without assistance.  Alopecia. Respirations not labored RA  HEENT:PERRL, sclerae not icteric. Oral mucosa moist without lesions, posterior pharynx clear.  Neck supple.  No JVD.  Lymphatics:no cervical,suraclavicular, axillary adenopathy Resp: clear to auscultation bilaterally and normal percussion bilaterally Cardio: regular rate and rhythm. No gallop.Clear heart sounds GI: soft, nontender, not distended, no mass or organomegaly. Some bowel sounds.  Musculoskeletal/ Extremities: without pitting edema,  cords, tenderness Neuro: speech fluent, CN intact. Moves all extremities easily. Gait otherwise not remarkable Skin without rash, ecchymosis.  Petechiae present under clear adhesive dressing at accessed [facility].  Axillae benign. Portacath-without erythema or tenderness, other than petechiae site not remarkable.  Lab Results:  Results for orders placed in visit on 03/18/14  CBC WITH DIFFERENTIAL      Result Value Ref Range   WBC 6.3  3.9 - 10.3 10e3/uL   HGB 8.8 (*) 11.6 - 15.9 g/dL   HCT 28.2 (*) 34.8 - 46.6 %   Platelets 28 (*) 145 - 400 10e3/uL   MCV 91.3  79.5 - 101.0 fL   MCH 28.5  25.1 - 34.0 pg   MCHC 31.2 (*) 31.5 - 36.0 g/dL   RBC 3.09 (*) 3.70 - 5.45 10e6/uL   RDW 18.7 (*) 11.2 - 14.5 %  COMPREHENSIVE METABOLIC PANEL (UX83)      Result Value Ref Range   Sodium 140  136 - 145 mEq/L   Potassium 4.2  3.5 - 5.1 mEq/L   Chloride 103  98 - 109 mEq/L   CO2 27  22 - 29 mEq/L   Glucose 120  70 - 140 mg/dl   BUN 12.5  7.0 - 26.0 mg/dL   Creatinine 0.8  0.6 - 1.1 mg/dL   Total Bilirubin 0.60  0.20 - 1.20 mg/dL   Alkaline Phosphatase 89  40 - 150 U/L   AST 59 (*) 5 - 34 U/L   ALT 12  0 - 55 U/L   Total Protein 7.0  6.4 - 8.3 g/dL   Albumin 3.5  3.5 - 5.0 g/dL   Calcium 9.7  8.4 - 10.4 mg/dL   Anion Gap 10  3 - 11 mEq/L  MANUAL DIFFERENTIAL      Result Value Ref Range   ANC ([facility] manual diff) 2.8  1.5 - 6.5 10e3/uL   ALC 1.6  0.9 - 3.3 10e3/uL   SEG 15 (*) 38 - 77 %   Band Neutrophils 8  0 - 10 %   LYMPH 25  14 - 49 %   MONO 13  0 - 14 %   EOS 3  0 - 7 %   Basophil 5 (*) 0 - 2 %   Metamyelocytes 8 (*) 0 - 0 %   Myelocytes 13 (*) 0 - 0 %   PROMYELO 3 (*) 0 - 0 %   Blasts 7 (*) 0 - 0 %   Variant Lymph 0  0 - 0 %   Other Cell 0  0 - 0 %   nRBC 48 (*) 0 - 0 %   Polychromasia Moderate  Slight   Basophilic Stippling Few  Negative   Tear Drop Cells Moderate  Negative   Ovalocytes Moderate  Negative   PLT EST Decreased  Adequate   Platelet Morphology Large Platelets  Within  Normal Limits    Last iron studies 07-2013 with serum iron 50, %sat 19 and ferritin 7314. Folate and B12 good then. Multiple blood draws generally weekly since 07-2013. Will add iron studies with next [facility] access.  Studies/Results: CT CHEST, ABDOMEN, AND PELVIS WITH CONTRAST 03-16-14 COMPARISON: CT chest, abdomen and pelvis 08/06/2013. PET CT scan  08/21/2013.  FINDINGS:  CT CHEST FINDINGS  The patient is status post right mastectomy and axillary dissection.  There is no axillary, hilar or mediastinal lymphadenopathy. Heart  size is normal. No pleural or pericardial effusion is identified.  Small calcified pleural plaques are again seen, unchanged. Small  focus of opacity in the periphery of the lingula seen on the prior  examination is not visualized today. It likely represented  atelectasis. No nodule, mass or consolidative process is seen. Bones  demonstrate diffuse osteosclerosis. The degree of sclerosis is  increased. No fracture is identified. No lytic lesion is seen.  CT ABDOMEN AND PELVIS FINDINGS  The liver, right adrenal gland, kidneys and pancreas appear normal.  1.6 x 1.3 cm low attenuating left adrenal nodule consistent with a  benign adenoma is unchanged. The spleen measures approximately 11.8  cm craniocaudal compared to 9.8 cm on the prior study. No focal  splenic lesion is identified. Calcified uterine fibroid is again  seen. Adnexa and urinary bladder are unremarkable. The stomach and  small and large bowel appear normal. No lymphadenopathy is seen.  Bones demonstrate diffuse osteosclerosis. As in the chest, the  degree of sclerosis has increased. No fracture or lytic lesion is  seen.  IMPRESSION:  Diffuse osteosclerosis consistent with metastatic breast cancer. The  degree of sclerosis is increased since the prior CT chest, abdomen  and pelvis compatible with response to therapy.  0.8 cm nodular opacity in the lingula seen on the prior CT scan is  not visualized  today and likely represent atelectasis.  Mild increase in the size of the spleen without frank splenomegaly  may be treatment related. Recommend attention on follow-up exams  Calcified pleural plaques, unchanged.  No change in a left adrenal nodule most consistent with a benign  adenoma.   CT information discussed with patient and family member now, including increased sclerosis in bones, <1cm linuglar nodule not seen, mildly increased spleen size, stable left adrenal nodule.    Bone marrow report from 08-20-13 reviewed: basically replaced by metastatic breast cancer, which was ER PR +. Unable to tell storage iron. Minimal hematopoietic elements, certainly no increased megakaryocytes.  Medications: I have reviewed the patient's current medications. Will add hemocyte (namebrand if insurance covers that as prescription, otherwise ferrous fumarate or ferrous gluconate) and she is to take this on empty stomach with OJ or Vit C if tolerates. Mentioned IV iron as possible option if needed.  DISCUSSION: Patient and family member are pleased with progressive clinical improvement and with her tolerance of taxol. Will continue weekly taxol for now. Patient has generally been seen by provider weekly, tho we may be able to extend interval just a little if she continues to do well (not discussed now).  Assessment/Plan: 1.metastatic lobular right breast cancer: history as above, clearly improved tho still obviously symptomatic, tolerating weekly palliative taxol and xgeva well. 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 I will see her 4-30 with counts and she will have taxol + xgeva 5-1 if stable. PS from my history 2-3. 2.Anemia: likely multifactorial with marrow replacement, ongoing chemo and multiple blood draws. Will repeat iron studies and begin hemocyte or equivalent. Note elevated ferritin could be acute phase reactant 3.PAC  in 4.poor nutritional status: po intake improved and encouraged 5.previous vertigo resolved with PT maneuvers during hospitalization late 2014  All questions answered and they are in agreement with plan. Time spent 30 min, >50%  in counseling and coordination of care.  Gordy Levan, MD   03/18/2014, 8:40 PM

## 2014-03-18 NOTE — Telephone Encounter (Signed)
gv pt appt schedul for april/may

## 2014-03-19 ENCOUNTER — Encounter: Payer: Self-pay | Admitting: Oncology

## 2014-03-19 ENCOUNTER — Ambulatory Visit (HOSPITAL_BASED_OUTPATIENT_CLINIC_OR_DEPARTMENT_OTHER): Payer: 59

## 2014-03-19 DIAGNOSIS — C7952 Secondary malignant neoplasm of bone marrow: Secondary | ICD-10-CM

## 2014-03-19 DIAGNOSIS — Z5111 Encounter for antineoplastic chemotherapy: Secondary | ICD-10-CM

## 2014-03-19 DIAGNOSIS — C50919 Malignant neoplasm of unspecified site of unspecified female breast: Secondary | ICD-10-CM

## 2014-03-19 DIAGNOSIS — C7951 Secondary malignant neoplasm of bone: Secondary | ICD-10-CM

## 2014-03-19 DIAGNOSIS — C50119 Malignant neoplasm of central portion of unspecified female breast: Secondary | ICD-10-CM

## 2014-03-19 MED ORDER — FAMOTIDINE IN NACL 20-0.9 MG/50ML-% IV SOLN
20.0000 mg | Freq: Once | INTRAVENOUS | Status: AC
  Administered 2014-03-19: 20 mg via INTRAVENOUS

## 2014-03-19 MED ORDER — DEXAMETHASONE SODIUM PHOSPHATE 20 MG/5ML IJ SOLN
20.0000 mg | Freq: Once | INTRAMUSCULAR | Status: AC
  Administered 2014-03-19: 20 mg via INTRAVENOUS

## 2014-03-19 MED ORDER — ONDANSETRON 8 MG/50ML IVPB (CHCC)
8.0000 mg | Freq: Once | INTRAVENOUS | Status: AC
Start: 2014-03-19 — End: 2014-03-19
  Administered 2014-03-19: 8 mg via INTRAVENOUS

## 2014-03-19 MED ORDER — DEXAMETHASONE SODIUM PHOSPHATE 20 MG/5ML IJ SOLN
INTRAMUSCULAR | Status: AC
  Filled 2014-03-19: qty 5

## 2014-03-19 MED ORDER — SODIUM CHLORIDE 0.9 % IJ SOLN
10.0000 mL | INTRAMUSCULAR | Status: DC | PRN
  Administered 2014-03-19: 10 mL
  Filled 2014-03-19: qty 10

## 2014-03-19 MED ORDER — DIPHENHYDRAMINE HCL 50 MG/ML IJ SOLN
INTRAMUSCULAR | Status: AC
  Filled 2014-03-19: qty 1

## 2014-03-19 MED ORDER — PACLITAXEL CHEMO INJECTION 300 MG/50ML
60.0000 mg/m2 | Freq: Once | INTRAVENOUS | Status: AC
  Administered 2014-03-19: 84 mg via INTRAVENOUS
  Filled 2014-03-19: qty 14

## 2014-03-19 MED ORDER — SODIUM CHLORIDE 0.9 % IV SOLN
Freq: Once | INTRAVENOUS | Status: AC
  Administered 2014-03-19: 09:00:00 via INTRAVENOUS

## 2014-03-19 MED ORDER — ONDANSETRON 8 MG/NS 50 ML IVPB
INTRAVENOUS | Status: AC
Start: 2014-03-19 — End: 2014-03-19
  Filled 2014-03-19: qty 8

## 2014-03-19 MED ORDER — DIPHENHYDRAMINE HCL 50 MG/ML IJ SOLN
25.0000 mg | Freq: Once | INTRAMUSCULAR | Status: AC
  Administered 2014-03-19: 25 mg via INTRAVENOUS

## 2014-03-19 MED ORDER — HEPARIN SOD (PORK) LOCK FLUSH 100 UNIT/ML IV SOLN
500.0000 [IU] | Freq: Once | INTRAVENOUS | Status: AC | PRN
  Administered 2014-03-19: 500 [IU]
  Filled 2014-03-19: qty 5

## 2014-03-19 MED ORDER — FAMOTIDINE IN NACL 20-0.9 MG/50ML-% IV SOLN
INTRAVENOUS | Status: AC
Start: 2014-03-19 — End: 2014-03-19
  Filled 2014-03-19: qty 50

## 2014-03-19 NOTE — Progress Notes (Signed)
Per Dr. Marko Plume ok to treat with platelets of 28.

## 2014-03-19 NOTE — Patient Instructions (Addendum)
[facility] Discharge Instructions for Patients Receiving Chemotherapy  Today you received the following chemotherapy agents taxol.   To help prevent nausea and vomiting after your treatment, we encourage you to take your nausea medication zofran every 8-12 hours as needed.   If you develop nausea and vomiting that is not controlled by your nausea medication, call the clinic.   BELOW ARE SYMPTOMS THAT SHOULD BE REPORTED IMMEDIATELY:  *FEVER GREATER THAN 100.5 F  *CHILLS WITH OR WITHOUT FEVER  NAUSEA AND VOMITING THAT IS NOT CONTROLLED WITH YOUR NAUSEA MEDICATION  *UNUSUAL SHORTNESS OF BREATH  *UNUSUAL BRUISING OR BLEEDING  TENDERNESS IN MOUTH AND THROAT WITH OR WITHOUT PRESENCE OF ULCERS  *URINARY PROBLEMS  *BOWEL PROBLEMS  UNUSUAL RASH Items with * indicate a potential emergency and should be followed up as soon as possible.  Feel free to call the clinic you have any questions or concerns. The clinic phone number is [phone].

## 2014-03-19 NOTE — Progress Notes (Signed)
Called and spoke with the patient and advised her of estimate of her treatments-that amts can be higher or lower. Her insurance is paying at this time, but I advised her if any changes to treatment and she sees the bill is high, to call me and I can see if any asst is available for that treatment.

## 2014-03-23 ENCOUNTER — Other Ambulatory Visit: Payer: Self-pay | Admitting: Oncology

## 2014-03-25 ENCOUNTER — Ambulatory Visit (HOSPITAL_BASED_OUTPATIENT_CLINIC_OR_DEPARTMENT_OTHER): Payer: 59 | Admitting: Oncology

## 2014-03-25 ENCOUNTER — Ambulatory Visit (HOSPITAL_BASED_OUTPATIENT_CLINIC_OR_DEPARTMENT_OTHER): Payer: 59

## 2014-03-25 ENCOUNTER — Encounter: Payer: Self-pay | Admitting: Oncology

## 2014-03-25 ENCOUNTER — Telehealth: Payer: Self-pay | Admitting: Oncology

## 2014-03-25 ENCOUNTER — Telehealth: Payer: Self-pay | Admitting: *Deleted

## 2014-03-25 VITALS — BP 131/76 | HR 130 | Temp 98.2°F | Resp 20 | Ht 63.0 in | Wt 111.6 lb

## 2014-03-25 DIAGNOSIS — C7951 Secondary malignant neoplasm of bone: Secondary | ICD-10-CM

## 2014-03-25 DIAGNOSIS — C7952 Secondary malignant neoplasm of bone marrow: Secondary | ICD-10-CM

## 2014-03-25 DIAGNOSIS — D649 Anemia, unspecified: Secondary | ICD-10-CM

## 2014-03-25 DIAGNOSIS — D6959 Other secondary thrombocytopenia: Secondary | ICD-10-CM

## 2014-03-25 DIAGNOSIS — C50919 Malignant neoplasm of unspecified site of unspecified female breast: Secondary | ICD-10-CM

## 2014-03-25 DIAGNOSIS — Z9481 Bone marrow transplant status: Secondary | ICD-10-CM

## 2014-03-25 DIAGNOSIS — C50119 Malignant neoplasm of central portion of unspecified female breast: Secondary | ICD-10-CM

## 2014-03-25 DIAGNOSIS — Z95828 Presence of other vascular implants and grafts: Secondary | ICD-10-CM

## 2014-03-25 LAB — MANUAL DIFFERENTIAL
ALC: 2.6 10*3/uL (ref 0.9–3.3)
ANC (CHCC manual diff): 4.6 10*3/uL (ref 1.5–6.5)
BASOPHIL: 5 % — AB (ref 0–2)
Band Neutrophils: 13 % — ABNORMAL HIGH (ref 0–10)
Blasts: 7 % — ABNORMAL HIGH (ref 0–0)
EOS: 2 % (ref 0–7)
LYMPH: 27 % (ref 14–49)
MONO: 10 % (ref 0–14)
Metamyelocytes: 5 % — ABNORMAL HIGH (ref 0–0)
Myelocytes: 18 % — ABNORMAL HIGH (ref 0–0)
Other Cell: 0 % (ref 0–0)
PLT EST: DECREASED
PROMYELO: 2 % — ABNORMAL HIGH (ref 0–0)
SEG: 11 % — ABNORMAL LOW (ref 38–77)
VARIANT LYMPH: 0 % (ref 0–0)
nRBC: 25 % — ABNORMAL HIGH (ref 0–0)

## 2014-03-25 LAB — COMPREHENSIVE METABOLIC PANEL (CC13)
ALBUMIN: 3.3 g/dL — AB (ref 3.5–5.0)
ALT: 12 U/L (ref 0–55)
ANION GAP: 10 meq/L (ref 3–11)
AST: 38 U/L — ABNORMAL HIGH (ref 5–34)
Alkaline Phosphatase: 102 U/L (ref 40–150)
BUN: 19.5 mg/dL (ref 7.0–26.0)
CALCIUM: 9.8 mg/dL (ref 8.4–10.4)
CO2: 26 meq/L (ref 22–29)
CREATININE: 0.8 mg/dL (ref 0.6–1.1)
Chloride: 104 mEq/L (ref 98–109)
Glucose: 119 mg/dl (ref 70–140)
Potassium: 4.2 mEq/L (ref 3.5–5.1)
Sodium: 140 mEq/L (ref 136–145)
Total Bilirubin: 0.46 mg/dL (ref 0.20–1.20)
Total Protein: 6.9 g/dL (ref 6.4–8.3)

## 2014-03-25 LAB — CBC WITH DIFFERENTIAL/PLATELET
HEMATOCRIT: 27.2 % — AB (ref 34.8–46.6)
HGB: 9.1 g/dL — ABNORMAL LOW (ref 11.6–15.9)
MCH: 29.8 pg (ref 25.1–34.0)
MCHC: 33.3 g/dL (ref 31.5–36.0)
MCV: 89.4 fL (ref 79.5–101.0)
Platelets: 37 10*3/uL — ABNORMAL LOW (ref 145–400)
RBC: 3.04 10*6/uL — ABNORMAL LOW (ref 3.70–5.45)
RDW: 18.2 % — ABNORMAL HIGH (ref 11.2–14.5)
WBC: 9.8 10*3/uL (ref 3.9–10.3)

## 2014-03-25 LAB — IRON AND TIBC CHCC
%SAT: 24 % (ref 21–57)
IRON: 71 ug/dL (ref 41–142)
TIBC: 290 ug/dL (ref 236–444)
UIBC: 219 ug/dL (ref 120–384)

## 2014-03-25 LAB — FERRITIN CHCC: Ferritin: 1703 ng/ml — ABNORMAL HIGH (ref 9–269)

## 2014-03-25 MED ORDER — SODIUM CHLORIDE 0.9 % IJ SOLN
10.0000 mL | INTRAMUSCULAR | Status: DC | PRN
  Administered 2014-03-25: 10 mL via INTRAVENOUS
  Filled 2014-03-25: qty 10

## 2014-03-25 MED ORDER — HEPARIN SOD (PORK) LOCK FLUSH 100 UNIT/ML IV SOLN
500.0000 [IU] | Freq: Once | INTRAVENOUS | Status: AC
  Administered 2014-03-25: 500 [IU] via INTRAVENOUS
  Filled 2014-03-25: qty 5

## 2014-03-25 NOTE — Progress Notes (Signed)
OFFICE PROGRESS NOTE   03/25/2014   Physicians:David Swayne (PCP), H.Mezer   INTERVAL HISTORY:  Patient is seen, together with husband, in continuing attention to metastatic breast cancer which is extensively involving bone with secondary cytopenias. She continues treatment with weekly taxol and q 4 week xgeva, clinically gradually improving, PS now ECOG 2. Patient reports no new or different symptoms since she was seen last week. LE pain that seemed related to lying on CT table resolved and she has not needed pain medication since then. No bleeding. Appetite seems improving, still on marinol and drinking total 3 supplements daily (2 Enu, 1 Boost Breeze).   She has PAC   ONCOLOGIC HISTORY #1 patient presented with a subareolar mass in her right breast in October 2007, with ultrasound-guided biopsy 08/23/2006 showing invasive mammary carcinoma with lobular features. ER +100% PR 3% proliferation marker Ki-67 12% HER-2/neu 2+ by fish. MRI breasts 08/30/2006 showed an ill-defined enhancing mass in the right breast measuring 2.6 x 4.0 x 4.1 cm. Enhancement trailed out to the nipple but did not involve the nipple. There was also a small enhancing internal mammary node measuring 7 mm suspicious for metastatic disease. Left breast was negative.  #2 patient received neoadjuvant chemotherapy by Dr Eston Esters with [facility] regimen given dose dense for 4 cycles followed by Taxotere, from 09/20/2006 through February 2008. She had lumpectomy 02/07/2007. However there was essentially residual tumor dispersed over an area 1.8 cm with some close surgical margins at the medial area, and she underwent a mastectomy on 03/19/2007.  #3 she was seen by radiation oncology, did not receive radiation.  #4 Femara 2.5 mg daily, begun after chemotherapy and completed 5 years of therapy in June 2014.  #5. 08/05/13 for evaluation had new anemia. PET/CT chest abdomen and pelvis identified diffuse lytic lesions throughout the axial  and appendicular skeleton. Bone marrow biopsy was consistent with metastatic ER/PR positive breast cancer.  #6 status post Arimidex  #7 palliative single agent Taxol begun weekly starting 11-06-13, cycle 17 03-12-14.  Review of systems as above, also: No fever or symptoms of infection. Sleeping well. Bowels moving. No increased SOB. No peripheral neuropathy Remainder of 10 point Review of Systems negative.  Objective:  Vital signs in last 24 hours:  BP 131/76  Pulse 130  Temp(Src) 98.2 F (36.8 C) (Oral)  Resp 20  Ht _0  (1.6 m)  Wt 111 lb 9.6 oz (50.621 kg)  BMI 19.77 kg/m2 Weight down 1 lb. Alert, oriented and appropriate. Ambulatory without assistance. Able to get on and off exam table and to change positions with minimal assistance. Wearing wig.  HEENT:PERRL, sclerae not icteric. Oral mucosa moist without lesions, posterior pharynx clear.  Neck supple. No JVD.  Lymphatics:no cervical,suraclavicular adenopathy Resp: clear to auscultation bilaterally and normal percussion bilaterally Cardio: regular rate and rhythm. No gallop. GI: soft, nontender, not distended, no mass or organomegaly. Normally active bowel sounds. Musculoskeletal/ Extremities: without pitting edema, cords, tenderness Neuro: speech fluent and appropriate. Moves all extremities easily, gail steady. Mood and affect appropriate Skin without rash, ecchymosis, petechiae Breasts:right mastectomy scar not remarkable Portacath-without erythema or tenderness  Lab Results:  Results for orders placed in visit on 03/25/14  CBC WITH DIFFERENTIAL      Result Value Ref Range   WBC 9.8  3.9 - 10.3 10e3/uL   HGB 9.1 (*) 11.6 - 15.9 g/dL   HCT 27.2 (*) 34.8 - 46.6 %   Platelets 37 (*) 145 - 400 10e3/uL   MCV  89.4  79.5 - 101.0 fL   MCH 29.8  25.1 - 34.0 pg   MCHC 33.3  31.5 - 36.0 g/dL   RBC 3.04 (*) 3.70 - 5.45 10e6/uL   RDW 18.2 (*) 11.2 - 14.5 %  COMPREHENSIVE METABOLIC PANEL (SW10)      Result Value Ref Range    Sodium 140  136 - 145 mEq/L   Potassium 4.2  3.5 - 5.1 mEq/L   Chloride 104  98 - 109 mEq/L   CO2 26  22 - 29 mEq/L   Glucose 119  70 - 140 mg/dl   BUN 19.5  7.0 - 26.0 mg/dL   Creatinine 0.8  0.6 - 1.1 mg/dL   Total Bilirubin 0.46  0.20 - 1.20 mg/dL   Alkaline Phosphatase 102  40 - 150 U/L   AST 38 (*) 5 - 34 U/L   ALT 12  0 - 55 U/L   Total Protein 6.9  6.4 - 8.3 g/dL   Albumin 3.3 (*) 3.5 - 5.0 g/dL   Calcium 9.8  8.4 - 10.4 mg/dL   Anion Gap 10  3 - 11 mEq/L  FERRITIN [facility]      Result Value Ref Range   Ferritin 1,703 (*) 9 - 269 ng/ml  IRON AND TIBC [facility]      Result Value Ref Range   Iron 71  41 - 142 ug/dL   TIBC 290  236 - 444 ug/dL   UIBC 219  120 - 384 ug/dL   %SAT 24  21 - 57 %  MANUAL DIFFERENTIAL      Result Value Ref Range   ANC ([facility] manual diff) 4.6  1.5 - 6.5 10e3/uL   ALC 2.6  0.9 - 3.3 10e3/uL   SEG 11 (*) 38 - 77 %   Band Neutrophils 13 (*) 0 - 10 %   LYMPH 27  14 - 49 %   MONO 10  0 - 14 %   EOS 2  0 - 7 %   Basophil 5 (*) 0 - 2 %   Metamyelocytes 5 (*) 0 - 0 %   Myelocytes 18 (*) 0 - 0 %   PROMYELO 2 (*) 0 - 0 %   Blasts 7 (*) 0 - 0 %   Variant Lymph 0  0 - 0 %   Other Cell 0  0 - 0 %   nRBC 25 (*) 0 - 0 %   Polychromasia Moderate  Slight   Tear Drop Cells Moderate  Negative   Ovalocytes Moderate  Negative   PLT EST Decreased  Adequate   Hemoglobin and platelets both slightly improved. Iron studies available after visit   Studies/Results:  No results found.  Medications: I have reviewed the patient's current medications. Added Hemocyte daily. Not clear if she is on supplemental calcium with the Vit D; husband understands to begin 1200 mg calcium daily if not when they check meds at home  DISCUSSION: instructions given for oral iron and calcium as above. Patient and husband aware that MD visits may not always be able to coordinate with treatments, but that we will try to accommodate as best possible. As she is doing so much better, she does  not have to be seen quite weekly now.  Assessment/Plan:  1.metastatic lobular right breast cancer: history as above, clearly improved tho still obviously symptomatic, tolerating weekly palliative taxol and xgeva well. 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 She will have taxol + xgeva 5-1. 2.Anemia: likely multifactorial with marrow replacement, ongoing chemo and multiple blood draws. Will  begin hemocyte if tolerates, and follow. Note elevated ferritin could be acute phase reactant  3.PAC in  4.poor nutritional status: po intake improved and encouraged  5.previous vertigo resolved with PT maneuvers during hospitalization late 2014 6. Living Will and HCPOA done, tho I do not find copy in EMR now   Chemotherapy parameters discussed with infusion RN. Patient and husband had all questions answered to their satisfaction and are in agreement with plan.   Gordy Levan, MD   03/25/2014, 1:13 PM

## 2014-03-25 NOTE — Patient Instructions (Signed)

## 2014-03-25 NOTE — Telephone Encounter (Signed)
Per staff phone call and POF I have schedueld appts.  JMW  

## 2014-03-25 NOTE — Telephone Encounter (Signed)
, °

## 2014-03-26 ENCOUNTER — Ambulatory Visit (HOSPITAL_BASED_OUTPATIENT_CLINIC_OR_DEPARTMENT_OTHER): Payer: 59

## 2014-03-26 ENCOUNTER — Other Ambulatory Visit: Payer: 59

## 2014-03-26 ENCOUNTER — Ambulatory Visit: Payer: 59

## 2014-03-26 VITALS — BP 118/53 | HR 104 | Temp 98.6°F | Resp 18

## 2014-03-26 DIAGNOSIS — C50919 Malignant neoplasm of unspecified site of unspecified female breast: Secondary | ICD-10-CM

## 2014-03-26 DIAGNOSIS — C7951 Secondary malignant neoplasm of bone: Secondary | ICD-10-CM

## 2014-03-26 DIAGNOSIS — C7952 Secondary malignant neoplasm of bone marrow: Secondary | ICD-10-CM

## 2014-03-26 DIAGNOSIS — C50119 Malignant neoplasm of central portion of unspecified female breast: Secondary | ICD-10-CM

## 2014-03-26 DIAGNOSIS — Z5111 Encounter for antineoplastic chemotherapy: Secondary | ICD-10-CM

## 2014-03-26 MED ORDER — DEXAMETHASONE SODIUM PHOSPHATE 20 MG/5ML IJ SOLN
20.0000 mg | Freq: Once | INTRAMUSCULAR | Status: AC
  Administered 2014-03-26: 20 mg via INTRAVENOUS

## 2014-03-26 MED ORDER — DENOSUMAB 120 MG/1.7ML ~~LOC~~ SOLN
120.0000 mg | Freq: Once | SUBCUTANEOUS | Status: AC
  Administered 2014-03-26: 120 mg via SUBCUTANEOUS
  Filled 2014-03-26: qty 1.7

## 2014-03-26 MED ORDER — DIPHENHYDRAMINE HCL 50 MG/ML IJ SOLN
25.0000 mg | Freq: Once | INTRAMUSCULAR | Status: AC
  Administered 2014-03-26: 25 mg via INTRAVENOUS

## 2014-03-26 MED ORDER — ONDANSETRON 8 MG/NS 50 ML IVPB
INTRAVENOUS | Status: AC
  Filled 2014-03-26: qty 8

## 2014-03-26 MED ORDER — DIPHENHYDRAMINE HCL 50 MG/ML IJ SOLN
INTRAMUSCULAR | Status: AC
  Filled 2014-03-26: qty 1

## 2014-03-26 MED ORDER — HEPARIN SOD (PORK) LOCK FLUSH 100 UNIT/ML IV SOLN
500.0000 [IU] | Freq: Once | INTRAVENOUS | Status: AC | PRN
  Administered 2014-03-26: 500 [IU]
  Filled 2014-03-26: qty 5

## 2014-03-26 MED ORDER — FAMOTIDINE IN NACL 20-0.9 MG/50ML-% IV SOLN
20.0000 mg | Freq: Once | INTRAVENOUS | Status: AC
  Administered 2014-03-26: 20 mg via INTRAVENOUS

## 2014-03-26 MED ORDER — PACLITAXEL CHEMO INJECTION 300 MG/50ML
60.0000 mg/m2 | Freq: Once | INTRAVENOUS | Status: AC
  Administered 2014-03-26: 84 mg via INTRAVENOUS
  Filled 2014-03-26: qty 14

## 2014-03-26 MED ORDER — SODIUM CHLORIDE 0.9 % IJ SOLN
10.0000 mL | INTRAMUSCULAR | Status: DC | PRN
  Administered 2014-03-26: 10 mL
  Filled 2014-03-26: qty 10

## 2014-03-26 MED ORDER — SODIUM CHLORIDE 0.9 % IV SOLN
Freq: Once | INTRAVENOUS | Status: AC
  Administered 2014-03-26: 08:00:00 via INTRAVENOUS

## 2014-03-26 MED ORDER — DEXAMETHASONE SODIUM PHOSPHATE 20 MG/5ML IJ SOLN
INTRAMUSCULAR | Status: AC
  Filled 2014-03-26: qty 5

## 2014-03-26 MED ORDER — ONDANSETRON 8 MG/50ML IVPB (CHCC)
8.0000 mg | Freq: Once | INTRAVENOUS | Status: AC
  Administered 2014-03-26: 8 mg via INTRAVENOUS

## 2014-03-26 MED ORDER — FAMOTIDINE IN NACL 20-0.9 MG/50ML-% IV SOLN
INTRAVENOUS | Status: AC
  Filled 2014-03-26: qty 50

## 2014-03-26 NOTE — Patient Instructions (Addendum)
[place] Discharge Instructions for Patients Receiving Chemotherapy  Today you received the following chemotherapy agents taxol. You also received your xgeva.  To help prevent nausea and vomiting after your treatment, we encourage you to take your nausea medication zofran.   If you develop nausea and vomiting that is not controlled by your nausea medication, call the clinic.   BELOW ARE SYMPTOMS THAT SHOULD BE REPORTED IMMEDIATELY:  *FEVER GREATER THAN 100.5 F  *CHILLS WITH OR WITHOUT FEVER  NAUSEA AND VOMITING THAT IS NOT CONTROLLED WITH YOUR NAUSEA MEDICATION  *UNUSUAL SHORTNESS OF BREATH  *UNUSUAL BRUISING OR BLEEDING  TENDERNESS IN MOUTH AND THROAT WITH OR WITHOUT PRESENCE OF ULCERS  *URINARY PROBLEMS  *BOWEL PROBLEMS  UNUSUAL RASH Items with * indicate a potential emergency and should be followed up as soon as possible.  Feel free to call the clinic you have any questions or concerns. The clinic phone number is [phone].

## 2014-03-26 NOTE — Progress Notes (Signed)
Patient states she feels like she is doing very well.  OK to treat today despite platelets of 37K - VO given and read back from Dr. Godfrey Pick. Patient has several animals at home,including several dogs.  Reviewed with patient to be careful of dogs jumping on her with her platelets so low.

## 2014-04-02 ENCOUNTER — Ambulatory Visit (HOSPITAL_BASED_OUTPATIENT_CLINIC_OR_DEPARTMENT_OTHER): Payer: 59

## 2014-04-02 VITALS — BP 124/55 | HR 107 | Temp 98.0°F | Resp 18

## 2014-04-02 DIAGNOSIS — C50119 Malignant neoplasm of central portion of unspecified female breast: Secondary | ICD-10-CM

## 2014-04-02 DIAGNOSIS — Z5111 Encounter for antineoplastic chemotherapy: Secondary | ICD-10-CM

## 2014-04-02 DIAGNOSIS — C50919 Malignant neoplasm of unspecified site of unspecified female breast: Secondary | ICD-10-CM

## 2014-04-02 DIAGNOSIS — Z95828 Presence of other vascular implants and grafts: Secondary | ICD-10-CM

## 2014-04-02 DIAGNOSIS — D649 Anemia, unspecified: Secondary | ICD-10-CM

## 2014-04-02 DIAGNOSIS — C7951 Secondary malignant neoplasm of bone: Secondary | ICD-10-CM

## 2014-04-02 DIAGNOSIS — C7952 Secondary malignant neoplasm of bone marrow: Secondary | ICD-10-CM

## 2014-04-02 LAB — CBC WITH DIFFERENTIAL/PLATELET
HEMATOCRIT: 28.6 % — AB (ref 34.8–46.6)
HEMOGLOBIN: 8.9 g/dL — AB (ref 11.6–15.9)
MCH: 28.8 pg (ref 25.1–34.0)
MCHC: 31.1 g/dL — ABNORMAL LOW (ref 31.5–36.0)
MCV: 92.6 fL (ref 79.5–101.0)
Platelets: 29 10*3/uL — ABNORMAL LOW (ref 145–400)
RBC: 3.09 10*6/uL — ABNORMAL LOW (ref 3.70–5.45)
RDW: 19.2 % — ABNORMAL HIGH (ref 11.2–14.5)
WBC: 6.3 10*3/uL (ref 3.9–10.3)

## 2014-04-02 LAB — MANUAL DIFFERENTIAL
ALC: 2.2 10*3/uL (ref 0.9–3.3)
ANC (CHCC manual diff): 2.1 10*3/uL (ref 1.5–6.5)
BAND NEUTROPHILS: 4 % (ref 0–10)
Basophil: 4 % — ABNORMAL HIGH (ref 0–2)
Blasts: 4 % — ABNORMAL HIGH (ref 0–0)
EOS%: 2 % (ref 0–7)
LYMPH: 35 % (ref 14–49)
METAMYELOCYTES PCT: 4 % — AB (ref 0–0)
MONO: 19 % — ABNORMAL HIGH (ref 0–14)
Myelocytes: 3 % — ABNORMAL HIGH (ref 0–0)
NRBC: 29 % — AB (ref 0–0)
Other Cell: 0 % (ref 0–0)
PLT EST: DECREASED
PROMYELO: 2 % — ABNORMAL HIGH (ref 0–0)
SEG: 23 % — ABNORMAL LOW (ref 38–77)
Variant Lymph: 0 % (ref 0–0)

## 2014-04-02 LAB — COMPREHENSIVE METABOLIC PANEL (CC13)
ALBUMIN: 3.4 g/dL — AB (ref 3.5–5.0)
ALT: 16 U/L (ref 0–55)
ANION GAP: 10 meq/L (ref 3–11)
AST: 42 U/L — ABNORMAL HIGH (ref 5–34)
Alkaline Phosphatase: 92 U/L (ref 40–150)
BILIRUBIN TOTAL: 0.54 mg/dL (ref 0.20–1.20)
BUN: 16.8 mg/dL (ref 7.0–26.0)
CALCIUM: 9.8 mg/dL (ref 8.4–10.4)
CHLORIDE: 104 meq/L (ref 98–109)
CO2: 26 meq/L (ref 22–29)
Creatinine: 0.8 mg/dL (ref 0.6–1.1)
GLUCOSE: 149 mg/dL — AB (ref 70–140)
Potassium: 3.9 mEq/L (ref 3.5–5.1)
SODIUM: 140 meq/L (ref 136–145)
TOTAL PROTEIN: 6.7 g/dL (ref 6.4–8.3)

## 2014-04-02 MED ORDER — DEXAMETHASONE SODIUM PHOSPHATE 20 MG/5ML IJ SOLN
INTRAMUSCULAR | Status: AC
  Filled 2014-04-02: qty 5

## 2014-04-02 MED ORDER — ONDANSETRON 8 MG/NS 50 ML IVPB
INTRAVENOUS | Status: AC
  Filled 2014-04-02: qty 8

## 2014-04-02 MED ORDER — HEPARIN SOD (PORK) LOCK FLUSH 100 UNIT/ML IV SOLN
500.0000 [IU] | Freq: Once | INTRAVENOUS | Status: AC | PRN
  Administered 2014-04-02: 500 [IU]
  Filled 2014-04-02: qty 5

## 2014-04-02 MED ORDER — ONDANSETRON 8 MG/50ML IVPB (CHCC)
8.0000 mg | Freq: Once | INTRAVENOUS | Status: AC
  Administered 2014-04-02: 8 mg via INTRAVENOUS

## 2014-04-02 MED ORDER — SODIUM CHLORIDE 0.9 % IJ SOLN
10.0000 mL | INTRAMUSCULAR | Status: DC | PRN
  Administered 2014-04-02 (×2): 10 mL via INTRAVENOUS
  Filled 2014-04-02: qty 10

## 2014-04-02 MED ORDER — SODIUM CHLORIDE 0.9 % IJ SOLN
10.0000 mL | INTRAMUSCULAR | Status: DC | PRN
  Filled 2014-04-02: qty 10

## 2014-04-02 MED ORDER — FAMOTIDINE IN NACL 20-0.9 MG/50ML-% IV SOLN
INTRAVENOUS | Status: AC
  Filled 2014-04-02: qty 50

## 2014-04-02 MED ORDER — DIPHENHYDRAMINE HCL 50 MG/ML IJ SOLN
INTRAMUSCULAR | Status: AC
  Filled 2014-04-02: qty 1

## 2014-04-02 MED ORDER — FAMOTIDINE IN NACL 20-0.9 MG/50ML-% IV SOLN
20.0000 mg | Freq: Once | INTRAVENOUS | Status: AC
  Administered 2014-04-02: 20 mg via INTRAVENOUS

## 2014-04-02 MED ORDER — DEXAMETHASONE SODIUM PHOSPHATE 20 MG/5ML IJ SOLN
20.0000 mg | Freq: Once | INTRAMUSCULAR | Status: AC
  Administered 2014-04-02: 20 mg via INTRAVENOUS

## 2014-04-02 MED ORDER — DIPHENHYDRAMINE HCL 50 MG/ML IJ SOLN
25.0000 mg | Freq: Once | INTRAMUSCULAR | Status: AC
  Administered 2014-04-02: 25 mg via INTRAVENOUS

## 2014-04-02 MED ORDER — SODIUM CHLORIDE 0.9 % IV SOLN
Freq: Once | INTRAVENOUS | Status: AC
  Administered 2014-04-02: 09:00:00 via INTRAVENOUS

## 2014-04-02 MED ORDER — DEXTROSE 5 % IV SOLN
60.0000 mg/m2 | Freq: Once | INTRAVENOUS | Status: AC
  Administered 2014-04-02: 84 mg via INTRAVENOUS
  Filled 2014-04-02: qty 14

## 2014-04-02 NOTE — Patient Instructions (Signed)

## 2014-04-02 NOTE — Progress Notes (Signed)
Per Dr. Livesay's last office note, OK to treat with platelet count of 29 today. Parameters met. Also, patient denied having any bleeding.  

## 2014-04-02 NOTE — Patient Instructions (Signed)
[facility] Discharge Instructions for Patients Receiving Chemotherapy  Today you received the following chemotherapy agents: Taxol.  To help prevent nausea and vomiting after your treatment, we encourage you to take your nausea medication as prescribed.   If you develop nausea and vomiting that is not controlled by your nausea medication, call the clinic.   BELOW ARE SYMPTOMS THAT SHOULD BE REPORTED IMMEDIATELY:  *FEVER GREATER THAN 100.5 F  *CHILLS WITH OR WITHOUT FEVER  NAUSEA AND VOMITING THAT IS NOT CONTROLLED WITH YOUR NAUSEA MEDICATION  *UNUSUAL SHORTNESS OF BREATH  *UNUSUAL BRUISING OR BLEEDING  TENDERNESS IN MOUTH AND THROAT WITH OR WITHOUT PRESENCE OF ULCERS  *URINARY PROBLEMS  *BOWEL PROBLEMS  UNUSUAL RASH Items with * indicate a potential emergency and should be followed up as soon as possible.  Feel free to call the clinic you have any questions or concerns. The clinic phone number is [phone].    

## 2014-04-04 ENCOUNTER — Other Ambulatory Visit: Payer: Self-pay | Admitting: Oncology

## 2014-04-05 ENCOUNTER — Other Ambulatory Visit: Payer: Self-pay

## 2014-04-05 ENCOUNTER — Telehealth: Payer: Self-pay | Admitting: *Deleted

## 2014-04-05 DIAGNOSIS — C50919 Malignant neoplasm of unspecified site of unspecified female breast: Secondary | ICD-10-CM

## 2014-04-05 MED ORDER — DRONABINOL 2.5 MG PO CAPS: 2.5000 mg | ORAL_CAPSULE | Freq: Two times a day (BID) | ORAL | Status: DC

## 2014-04-05 NOTE — Telephone Encounter (Signed)
I have moved the appt on 5/15 from 830am to 9am due to a meeting. Patient aware

## 2014-04-05 NOTE — Telephone Encounter (Signed)
Refill for Marinol printed and signed.  Let pt know prescription is ready for pickup.  Patient voiced understanding.

## 2014-04-06 ENCOUNTER — Encounter: Payer: Self-pay | Admitting: Oncology

## 2014-04-06 NOTE — Progress Notes (Signed)
Delton See is being paid by insurance.

## 2014-04-07 ENCOUNTER — Ambulatory Visit (HOSPITAL_BASED_OUTPATIENT_CLINIC_OR_DEPARTMENT_OTHER): Payer: 59 | Admitting: Oncology

## 2014-04-07 ENCOUNTER — Encounter: Payer: Self-pay | Admitting: Oncology

## 2014-04-07 ENCOUNTER — Ambulatory Visit (HOSPITAL_BASED_OUTPATIENT_CLINIC_OR_DEPARTMENT_OTHER): Payer: 59

## 2014-04-07 VITALS — BP 119/68 | HR 128 | Temp 97.0°F | Resp 16

## 2014-04-07 VITALS — Wt 111.1 lb

## 2014-04-07 DIAGNOSIS — C50119 Malignant neoplasm of central portion of unspecified female breast: Secondary | ICD-10-CM

## 2014-04-07 DIAGNOSIS — C50919 Malignant neoplasm of unspecified site of unspecified female breast: Secondary | ICD-10-CM

## 2014-04-07 DIAGNOSIS — D649 Anemia, unspecified: Secondary | ICD-10-CM

## 2014-04-07 DIAGNOSIS — C7952 Secondary malignant neoplasm of bone marrow: Secondary | ICD-10-CM

## 2014-04-07 DIAGNOSIS — D696 Thrombocytopenia, unspecified: Secondary | ICD-10-CM

## 2014-04-07 DIAGNOSIS — H109 Unspecified conjunctivitis: Secondary | ICD-10-CM

## 2014-04-07 DIAGNOSIS — C7951 Secondary malignant neoplasm of bone: Secondary | ICD-10-CM

## 2014-04-07 DIAGNOSIS — Z95828 Presence of other vascular implants and grafts: Secondary | ICD-10-CM

## 2014-04-07 LAB — COMPREHENSIVE METABOLIC PANEL (CC13)
ALT: 17 U/L (ref 0–55)
ANION GAP: 13 meq/L — AB (ref 3–11)
AST: 37 U/L — ABNORMAL HIGH (ref 5–34)
Albumin: 3.6 g/dL (ref 3.5–5.0)
Alkaline Phosphatase: 92 U/L (ref 40–150)
BILIRUBIN TOTAL: 0.51 mg/dL (ref 0.20–1.20)
BUN: 16.8 mg/dL (ref 7.0–26.0)
CALCIUM: 9.7 mg/dL (ref 8.4–10.4)
CO2: 24 meq/L (ref 22–29)
CREATININE: 0.8 mg/dL (ref 0.6–1.1)
Chloride: 104 mEq/L (ref 98–109)
Glucose: 150 mg/dl — ABNORMAL HIGH (ref 70–140)
Potassium: 3.7 mEq/L (ref 3.5–5.1)
Sodium: 141 mEq/L (ref 136–145)
TOTAL PROTEIN: 6.7 g/dL (ref 6.4–8.3)

## 2014-04-07 LAB — MANUAL DIFFERENTIAL
ALC: 1.7 10*3/uL (ref 0.9–3.3)
ANC (CHCC MAN DIFF): 1.8 10*3/uL (ref 1.5–6.5)
BASOPHIL: 3 % — AB (ref 0–2)
BLASTS: 3 % — AB (ref 0–0)
Band Neutrophils: 2 % (ref 0–10)
EOS: 2 % (ref 0–7)
LYMPH: 34 % (ref 14–49)
MONO: 20 % — ABNORMAL HIGH (ref 0–14)
Metamyelocytes: 2 % — ABNORMAL HIGH (ref 0–0)
Myelocytes: 5 % — ABNORMAL HIGH (ref 0–0)
Other Cell: 0 % (ref 0–0)
PLT EST: DECREASED
PROMYELO: 2 % — AB (ref 0–0)
SEG: 27 % — ABNORMAL LOW (ref 38–77)
Variant Lymph: 0 % (ref 0–0)
nRBC: 30 % — ABNORMAL HIGH (ref 0–0)

## 2014-04-07 LAB — CBC WITH DIFFERENTIAL/PLATELET
HEMATOCRIT: 28.4 % — AB (ref 34.8–46.6)
HEMOGLOBIN: 9 g/dL — AB (ref 11.6–15.9)
MCH: 28.8 pg (ref 25.1–34.0)
MCHC: 31.7 g/dL (ref 31.5–36.0)
MCV: 91 fL (ref 79.5–101.0)
Platelets: 26 10*3/uL — ABNORMAL LOW (ref 145–400)
RBC: 3.12 10*6/uL — ABNORMAL LOW (ref 3.70–5.45)
RDW: 18.3 % — AB (ref 11.2–14.5)
WBC: 5 10*3/uL (ref 3.9–10.3)

## 2014-04-07 MED ORDER — HEPARIN SOD (PORK) LOCK FLUSH 100 UNIT/ML IV SOLN
500.0000 [IU] | Freq: Once | INTRAVENOUS | Status: AC
  Administered 2014-04-07: 500 [IU] via INTRAVENOUS
  Filled 2014-04-07: qty 5

## 2014-04-07 MED ORDER — SODIUM CHLORIDE 0.9 % IJ SOLN
10.0000 mL | INTRAMUSCULAR | Status: DC | PRN
  Administered 2014-04-07: 10 mL via INTRAVENOUS
  Filled 2014-04-07: qty 10

## 2014-04-07 NOTE — Progress Notes (Signed)
OFFICE PROGRESS NOTE   04/07/2014   Physicians:Kalsoom Nathen May (PCP), H.Mezer, Pauline Good   INTERVAL HISTORY:  Patient is seen, alone for visit, having driven herself to office for first time today. She has done well in past week, still tolerating weekly taxol without problems, and no new concerns. She is due cycle 21 weekly taxol on 04-02-14 and will be due monthly Xgeva on 04-23-14.  Appetite is good with marinol, including 3 supplements daily in addition to regular meals. She denies bleeding, pain, GI symptoms, LE swelling. She has irritated right eye "allergies" but is to see ophthalmologist later today. She denies N/V or any peripheral neuropathy.  PAC is functioning without difficulty.   ONCOLOGIC HISTORY #1 patient presented with a subareolar mass in her right breast in October 2007, with ultrasound-guided biopsy 08/23/2006 showing invasive mammary carcinoma with lobular features. ER +100% PR 3% proliferation marker Ki-67 12% HER-2/neu 2+ by fish. MRI breasts 08/30/2006 showed an ill-defined enhancing mass in the right breast measuring 2.6 x 4.0 x 4.1 cm. Enhancement trailed out to the nipple but did not involve the nipple. There was also a small enhancing internal mammary node measuring 7 mm suspicious for metastatic disease. Left breast was negative.  #2 patient received neoadjuvant chemotherapy by Dr Eston Esters with [facility] regimen given dose dense for 4 cycles followed by Taxotere, from 09/20/2006 through February 2008. She had lumpectomy 02/07/2007. However there was essentially residual tumor dispersed over an area 1.8 cm with some close surgical margins at the medial area, and she underwent a mastectomy on 03/19/2007.  #3 she was seen by radiation oncology, did not receive radiation.  #4 Femara 2.5 mg daily, begun after chemotherapy and completed 5 years of therapy in June 2014.  #5. 08/05/13 for evaluation had new anemia. PET/CT chest abdomen and pelvis identified diffuse  lytic lesions throughout the axial and appendicular skeleton. Bone marrow biopsy was consistent with metastatic ER/PR positive breast cancer.  #6 status post Arimidex  #7 palliative single agent Taxol begun weekly starting 11-06-13, cycle 17 03-12-14.  Review of systems as above, also: No SOB or lower respiratory symptoms. Bowels moving well daily, even with po iron and calcium. Sleeps well. No problems with PAC. She has not needed additional pain medication this week Remainder of 10 point Review of Systems negative.  Objective:  Vital signs in last 24 hours:  Wt 111 lb 1.6 oz (50.395 kg) BP 119/68  HR 128 regular  resp 16 not labored RA  Temp 97  Alert, oriented and appropriate. Talkative, pleasant, looks comfortable. Ambulatory without assistance.  Alopecia  HEENT:PERRL, sclerae not icteric. Right conjunctiva and sclera injected laterally. Oral mucosa moist without lesions, posterior pharynx clear.  Neck supple. No JVD.  Lymphatics:no cervical,suraclavicular adenopathy Resp: clear to auscultation bilaterally  Cardio: regular rate and rhythm. No gallop. GI: soft, nontender, not distended, no mass or organomegaly. Normally active bowel sounds.  Musculoskeletal/ Extremities: without pitting edema, cords, tenderness Neuro: no change peripheral neuropathy. Otherwise nonfocal Skin without rash, ecchymosis, petechiae Breasts: Right mastectomy scar without evidence of local recurrence. Left breast without dominant mass, skin or nipple findings. Axillae benign. Portacath-without erythema or tenderness  Lab Results: CBC today WBC 5.0, ANC 1.8, Hgb 9.0, plt 26k. Still left shift with some blasts and promyelocytes called, as she has had, thought to reflect extent of marrow involvement with the breast cancer. CMET today normal with exception of glucose 150 (drinking supplement at lab draw), AST 37   Studies/Results:  No  results found.  Medications: I have reviewed the patient's current  medications. Marinol prescription rewritten.  DISCUSSION: patient is glad to continue weekly taxol treatments as planned, next on 5-15. I will see her again 5-22. She is due xgeva next on 5-29.  Assessment/Plan: 1.metastatic lobular right breast cancer: history as above, clearly improved tho still obviously symptomatic, tolerating weekly palliative taxol and xgeva well. 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 She will have taxol cycle 21 on 04-09-14 and I will see her with treatment 04-16-14. Next Xgeva 04-23-14. 2.Anemia: likely multifactorial with marrow replacement, ongoing chemo and multiple blood draws. Will begin hemocyte if tolerates, and follow. Note elevated ferritin could be acute phase reactant  3.PAC in  4.po intake improved 5.previous vertigo resolved with PT maneuvers during hospitalization late 2014  6. Living Will and HCPOA done and she will bring copies to be scanned into EMR. Husband is HCPOA. She does not want resuscitation or life support in irreversible situation. 7.Scleral and conjunctival injection: to see Dr Delman Cheadle today. Copies of today's labs and recent office note given to her now to take to Dr Delman Cheadle  Patient is in agreement with plan above.   Gordy Levan, MD   04/07/2014, 8:37 AM

## 2014-04-07 NOTE — Patient Instructions (Signed)

## 2014-04-09 ENCOUNTER — Ambulatory Visit (HOSPITAL_BASED_OUTPATIENT_CLINIC_OR_DEPARTMENT_OTHER): Payer: 59

## 2014-04-09 VITALS — BP 120/56 | HR 93 | Temp 97.9°F | Resp 16

## 2014-04-09 DIAGNOSIS — C50919 Malignant neoplasm of unspecified site of unspecified female breast: Secondary | ICD-10-CM

## 2014-04-09 DIAGNOSIS — C7952 Secondary malignant neoplasm of bone marrow: Secondary | ICD-10-CM

## 2014-04-09 DIAGNOSIS — C7951 Secondary malignant neoplasm of bone: Secondary | ICD-10-CM

## 2014-04-09 DIAGNOSIS — C50119 Malignant neoplasm of central portion of unspecified female breast: Secondary | ICD-10-CM

## 2014-04-09 DIAGNOSIS — Z5111 Encounter for antineoplastic chemotherapy: Secondary | ICD-10-CM

## 2014-04-09 MED ORDER — FAMOTIDINE IN NACL 20-0.9 MG/50ML-% IV SOLN
20.0000 mg | Freq: Once | INTRAVENOUS | Status: AC
  Administered 2014-04-09: 20 mg via INTRAVENOUS

## 2014-04-09 MED ORDER — ONDANSETRON 8 MG/50ML IVPB (CHCC)
8.0000 mg | Freq: Once | INTRAVENOUS | Status: AC
  Administered 2014-04-09: 8 mg via INTRAVENOUS

## 2014-04-09 MED ORDER — DEXAMETHASONE SODIUM PHOSPHATE 20 MG/5ML IJ SOLN
INTRAMUSCULAR | Status: AC
  Filled 2014-04-09: qty 5

## 2014-04-09 MED ORDER — PACLITAXEL CHEMO INJECTION 300 MG/50ML
60.0000 mg/m2 | Freq: Once | INTRAVENOUS | Status: AC
  Administered 2014-04-09: 84 mg via INTRAVENOUS
  Filled 2014-04-09: qty 14

## 2014-04-09 MED ORDER — SODIUM CHLORIDE 0.9 % IJ SOLN
10.0000 mL | INTRAMUSCULAR | Status: DC | PRN
  Administered 2014-04-09: 10 mL
  Filled 2014-04-09: qty 10

## 2014-04-09 MED ORDER — ONDANSETRON 8 MG/NS 50 ML IVPB
INTRAVENOUS | Status: AC
  Filled 2014-04-09: qty 8

## 2014-04-09 MED ORDER — FAMOTIDINE IN NACL 20-0.9 MG/50ML-% IV SOLN
INTRAVENOUS | Status: AC
  Filled 2014-04-09: qty 50

## 2014-04-09 MED ORDER — DIPHENHYDRAMINE HCL 50 MG/ML IJ SOLN
INTRAMUSCULAR | Status: AC
  Filled 2014-04-09: qty 1

## 2014-04-09 MED ORDER — DEXAMETHASONE SODIUM PHOSPHATE 20 MG/5ML IJ SOLN
20.0000 mg | Freq: Once | INTRAMUSCULAR | Status: AC
  Administered 2014-04-09: 20 mg via INTRAVENOUS

## 2014-04-09 MED ORDER — DIPHENHYDRAMINE HCL 50 MG/ML IJ SOLN
25.0000 mg | Freq: Once | INTRAMUSCULAR | Status: AC
  Administered 2014-04-09: 25 mg via INTRAVENOUS

## 2014-04-09 MED ORDER — HEPARIN SOD (PORK) LOCK FLUSH 100 UNIT/ML IV SOLN
500.0000 [IU] | Freq: Once | INTRAVENOUS | Status: AC | PRN
  Administered 2014-04-09: 500 [IU]
  Filled 2014-04-09: qty 5

## 2014-04-09 MED ORDER — SODIUM CHLORIDE 0.9 % IV SOLN
Freq: Once | INTRAVENOUS | Status: AC
  Administered 2014-04-09: 09:00:00 via INTRAVENOUS

## 2014-04-09 NOTE — Progress Notes (Signed)
Per Dr. Mariana Kaufman office note, may tx patient with platelets greater than 20. Platelets 04/07/14 was 26.

## 2014-04-09 NOTE — Patient Instructions (Signed)
[place] Discharge Instructions for Patients Receiving Chemotherapy  Today you received the following chemotherapy agents: Taxol  To help prevent nausea and vomiting after your treatment, we encourage you to take your nausea medication: Zofran 4 mg every 6 hrs as needed.    If you develop nausea and vomiting that is not controlled by your nausea medication, call the clinic.   BELOW ARE SYMPTOMS THAT SHOULD BE REPORTED IMMEDIATELY:  *FEVER GREATER THAN 100.5 F  *CHILLS WITH OR WITHOUT FEVER  NAUSEA AND VOMITING THAT IS NOT CONTROLLED WITH YOUR NAUSEA MEDICATION  *UNUSUAL SHORTNESS OF BREATH  *UNUSUAL BRUISING OR BLEEDING  TENDERNESS IN MOUTH AND THROAT WITH OR WITHOUT PRESENCE OF ULCERS  *URINARY PROBLEMS  *BOWEL PROBLEMS  UNUSUAL RASH Items with * indicate a potential emergency and should be followed up as soon as possible.  Feel free to call the clinic you have any questions or concerns. The clinic phone number is [phone].

## 2014-04-13 ENCOUNTER — Other Ambulatory Visit: Payer: Self-pay | Admitting: Oncology

## 2014-04-13 DIAGNOSIS — C50919 Malignant neoplasm of unspecified site of unspecified female breast: Secondary | ICD-10-CM

## 2014-04-13 DIAGNOSIS — C7951 Secondary malignant neoplasm of bone: Secondary | ICD-10-CM

## 2014-04-16 ENCOUNTER — Telehealth: Payer: Self-pay | Admitting: Oncology

## 2014-04-16 ENCOUNTER — Ambulatory Visit (HOSPITAL_BASED_OUTPATIENT_CLINIC_OR_DEPARTMENT_OTHER): Payer: 59

## 2014-04-16 ENCOUNTER — Telehealth: Payer: Self-pay | Admitting: *Deleted

## 2014-04-16 ENCOUNTER — Ambulatory Visit (HOSPITAL_BASED_OUTPATIENT_CLINIC_OR_DEPARTMENT_OTHER): Payer: 59 | Admitting: Oncology

## 2014-04-16 ENCOUNTER — Encounter: Payer: Self-pay | Admitting: Oncology

## 2014-04-16 ENCOUNTER — Other Ambulatory Visit (HOSPITAL_BASED_OUTPATIENT_CLINIC_OR_DEPARTMENT_OTHER): Payer: 59

## 2014-04-16 VITALS — BP 137/86 | HR 101 | Temp 98.4°F | Resp 18 | Ht 63.0 in | Wt 114.3 lb

## 2014-04-16 DIAGNOSIS — C7951 Secondary malignant neoplasm of bone: Secondary | ICD-10-CM

## 2014-04-16 DIAGNOSIS — C50119 Malignant neoplasm of central portion of unspecified female breast: Secondary | ICD-10-CM

## 2014-04-16 DIAGNOSIS — C7952 Secondary malignant neoplasm of bone marrow: Secondary | ICD-10-CM

## 2014-04-16 DIAGNOSIS — C50919 Malignant neoplasm of unspecified site of unspecified female breast: Secondary | ICD-10-CM

## 2014-04-16 DIAGNOSIS — Z5111 Encounter for antineoplastic chemotherapy: Secondary | ICD-10-CM

## 2014-04-16 DIAGNOSIS — E559 Vitamin D deficiency, unspecified: Secondary | ICD-10-CM

## 2014-04-16 DIAGNOSIS — D649 Anemia, unspecified: Secondary | ICD-10-CM

## 2014-04-16 DIAGNOSIS — Z170 Estrogen receptor positive status [ER+]: Secondary | ICD-10-CM

## 2014-04-16 DIAGNOSIS — D696 Thrombocytopenia, unspecified: Secondary | ICD-10-CM

## 2014-04-16 LAB — MANUAL DIFFERENTIAL
ALC: 2.3 10*3/uL (ref 0.9–3.3)
ANC (CHCC manual diff): 3.1 10*3/uL (ref 1.5–6.5)
BASOPHIL: 5 % — AB (ref 0–2)
Band Neutrophils: 9 % (ref 0–10)
Blasts: 1 % — ABNORMAL HIGH (ref 0–0)
EOS: 2 % (ref 0–7)
LYMPH: 33 % (ref 14–49)
METAMYELOCYTES PCT: 8 % — AB (ref 0–0)
MONO: 15 % — AB (ref 0–14)
MYELOCYTES: 14 % — AB (ref 0–0)
Other Cell: 0 % (ref 0–0)
PLT EST: DECREASED
PROMYELO: 0 % (ref 0–0)
SEG: 13 % — ABNORMAL LOW (ref 38–77)
Variant Lymph: 0 % (ref 0–0)
nRBC: 31 % — ABNORMAL HIGH (ref 0–0)

## 2014-04-16 LAB — COMPREHENSIVE METABOLIC PANEL (CC13)
ALBUMIN: 3.3 g/dL — AB (ref 3.5–5.0)
ALK PHOS: 82 U/L (ref 40–150)
ALT: 16 U/L (ref 0–55)
AST: 40 U/L — ABNORMAL HIGH (ref 5–34)
Anion Gap: 10 mEq/L (ref 3–11)
BUN: 15.5 mg/dL (ref 7.0–26.0)
CALCIUM: 9.5 mg/dL (ref 8.4–10.4)
CHLORIDE: 104 meq/L (ref 98–109)
CO2: 25 mEq/L (ref 22–29)
Creatinine: 0.7 mg/dL (ref 0.6–1.1)
Glucose: 115 mg/dl (ref 70–140)
POTASSIUM: 4 meq/L (ref 3.5–5.1)
SODIUM: 139 meq/L (ref 136–145)
TOTAL PROTEIN: 6.2 g/dL — AB (ref 6.4–8.3)
Total Bilirubin: 0.46 mg/dL (ref 0.20–1.20)

## 2014-04-16 LAB — CBC WITH DIFFERENTIAL/PLATELET
HCT: 28.4 % — ABNORMAL LOW (ref 34.8–46.6)
HGB: 8.9 g/dL — ABNORMAL LOW (ref 11.6–15.9)
MCH: 28.6 pg (ref 25.1–34.0)
MCHC: 31.3 g/dL — AB (ref 31.5–36.0)
MCV: 91.3 fL (ref 79.5–101.0)
PLATELETS: 25 10*3/uL — AB (ref 145–400)
RBC: 3.11 10*6/uL — ABNORMAL LOW (ref 3.70–5.45)
RDW: 19 % — ABNORMAL HIGH (ref 11.2–14.5)
WBC: 7 10*3/uL (ref 3.9–10.3)

## 2014-04-16 MED ORDER — FAMOTIDINE IN NACL 20-0.9 MG/50ML-% IV SOLN
20.0000 mg | Freq: Once | INTRAVENOUS | Status: AC
  Administered 2014-04-16: 20 mg via INTRAVENOUS

## 2014-04-16 MED ORDER — DEXAMETHASONE SODIUM PHOSPHATE 20 MG/5ML IJ SOLN
INTRAMUSCULAR | Status: AC
  Filled 2014-04-16: qty 5

## 2014-04-16 MED ORDER — FAMOTIDINE IN NACL 20-0.9 MG/50ML-% IV SOLN
INTRAVENOUS | Status: AC
  Filled 2014-04-16: qty 50

## 2014-04-16 MED ORDER — SODIUM CHLORIDE 0.9 % IV SOLN
Freq: Once | INTRAVENOUS | Status: AC
  Administered 2014-04-16: 10:00:00 via INTRAVENOUS

## 2014-04-16 MED ORDER — ONDANSETRON 8 MG/NS 50 ML IVPB
INTRAVENOUS | Status: AC
  Filled 2014-04-16: qty 8

## 2014-04-16 MED ORDER — DIPHENHYDRAMINE HCL 50 MG/ML IJ SOLN
INTRAMUSCULAR | Status: AC
  Filled 2014-04-16: qty 1

## 2014-04-16 MED ORDER — SODIUM CHLORIDE 0.9 % IJ SOLN
10.0000 mL | INTRAMUSCULAR | Status: DC | PRN
  Administered 2014-04-16: 10 mL
  Filled 2014-04-16: qty 10

## 2014-04-16 MED ORDER — DEXAMETHASONE SODIUM PHOSPHATE 20 MG/5ML IJ SOLN
20.0000 mg | Freq: Once | INTRAMUSCULAR | Status: AC
Start: 2014-04-16 — End: 2014-04-16
  Administered 2014-04-16: 20 mg via INTRAVENOUS

## 2014-04-16 MED ORDER — ONDANSETRON 8 MG/50ML IVPB (CHCC)
8.0000 mg | Freq: Once | INTRAVENOUS | Status: AC
  Administered 2014-04-16: 8 mg via INTRAVENOUS

## 2014-04-16 MED ORDER — PACLITAXEL CHEMO INJECTION 300 MG/50ML
60.0000 mg/m2 | Freq: Once | INTRAVENOUS | Status: AC
  Administered 2014-04-16: 84 mg via INTRAVENOUS
  Filled 2014-04-16: qty 14

## 2014-04-16 MED ORDER — HEPARIN SOD (PORK) LOCK FLUSH 100 UNIT/ML IV SOLN
500.0000 [IU] | Freq: Once | INTRAVENOUS | Status: AC | PRN
  Administered 2014-04-16: 500 [IU]
  Filled 2014-04-16: qty 5

## 2014-04-16 MED ORDER — DIPHENHYDRAMINE HCL 50 MG/ML IJ SOLN
25.0000 mg | Freq: Once | INTRAMUSCULAR | Status: AC
  Administered 2014-04-16: 25 mg via INTRAVENOUS

## 2014-04-16 NOTE — Progress Notes (Signed)
OFFICE PROGRESS NOTE   04/16/2014   Physicians:Kalsoom Nathen May (PCP), H.Mezer, Pauline Good   INTERVAL HISTORY:  Patient is seen, together with husband, in continuing attention to metastatic lobular breast cancer diffusely involving bones and bone marrow, continuing to improve clinically on treatment with weekly taxol and monthly xgeva. She has had no new or different problems since she was here last, including no bleeding, not more symptomatic from anemia, no pain. Appetite is good with marinol.   She has PAC   ONCOLOGIC HISTORY #1 patient presented with a subareolar mass in her right breast in October 2007, with ultrasound-guided biopsy 08/23/2006 showing invasive mammary carcinoma with lobular features. ER +100% PR 3% proliferation marker Ki-67 12% HER-2/neu 2+ by fish. MRI breasts 08/30/2006 showed an ill-defined enhancing mass in the right breast measuring 2.6 x 4.0 x 4.1 cm. Enhancement trailed out to the nipple but did not involve the nipple. There was also a small enhancing internal mammary node measuring 7 mm suspicious for metastatic disease. Left breast was negative.  #2 patient received neoadjuvant chemotherapy by Dr Eston Esters with [facility] regimen given dose dense for 4 cycles followed by Taxotere, from 09/20/2006 through February 2008. She had lumpectomy 02/07/2007. However there was essentially residual tumor dispersed over an area 1.8 cm with some close surgical margins at the medial area, and she underwent a mastectomy on 03/19/2007.  #3 she was seen by radiation oncology, did not receive radiation.  #4 Femara 2.5 mg daily, begun after chemotherapy and completed 5 years of therapy in June 2014.  #5. 08/05/13 for evaluation had new anemia. PET/CT chest abdomen and pelvis identified diffuse lytic lesions throughout the axial and appendicular skeleton. Bone marrow biopsy was consistent with metastatic ER/PR positive breast cancer.  #6 status post Arimidex  #7 palliative  single agent Taxol begun weekly starting 11-06-13, cycle 21 on 04-09-14.  Review of systems as above, also: No cough. Bowels moving regularly. Tolerating bid calcium without difficulty. No nausea or vomiting, no GERD. No peripheral neuropathy. She is up at home most of day, does rest regularly. Sleeping well at night.  Remainder of 10 point Review of Systems negative.  Objective:  Vital signs in last 24 hours:  BP 137/86  Pulse 101  Temp(Src) 98.4 F (36.9 C) (Oral)  Resp 18  Ht 5' 3"  (1.6 m)  Wt 114 lb 4.8 oz (51.846 kg)  BMI 20.25 kg/m2 weight is up 3 lbs.  Alert, oriented and appropriate. Ambulatory without assistance. Pale. No bleeding. Respirations not labored RA Alopecia  HEENT:PERRL, sclerae not icteric. Oral mucosa moist without lesions, posterior pharynx clear.  Neck supple. No JVD.  Lymphatics:no cervical,suraclavicular, axillary adenopathy Resp: clear to auscultation bilaterally and normal percussion bilaterally Cardio: regular rate and rhythm. No gallop. GI: soft, nontender including epigastrium, not distended, no mass or organomegaly. Normally active bowel sounds.  Musculoskeletal/ Extremities: without pitting edema, cords, tenderness Neuro: no peripheral neuropathy. Otherwise nonfocal Skin without rash, ecchymosis; a few petechiae UE Breasts: Right mastectomy scar without evidence of local recurrence.  Axillae benign. Portacath-without erythema or tenderness  Lab Results:  Results for orders placed in visit on 04/16/14  CBC WITH DIFFERENTIAL      Result Value Ref Range   WBC 7.0  3.9 - 10.3 10e3/uL   HGB 8.9 (*) 11.6 - 15.9 g/dL   HCT 28.4 (*) 34.8 - 46.6 %   Platelets 25 (*) 145 - 400 10e3/uL   MCV 91.3  79.5 - 101.0 fL   MCH  28.6  25.1 - 34.0 pg   MCHC 31.3 (*) 31.5 - 36.0 g/dL   RBC 3.11 (*) 3.70 - 5.45 10e6/uL   RDW 19.0 (*) 11.2 - 14.5 %  COMPREHENSIVE METABOLIC PANEL (WE31)      Result Value Ref Range   Sodium 139  136 - 145 mEq/L   Potassium 4.0   3.5 - 5.1 mEq/L   Chloride 104  98 - 109 mEq/L   CO2 25  22 - 29 mEq/L   Glucose 115  70 - 140 mg/dl   BUN 15.5  7.0 - 26.0 mg/dL   Creatinine 0.7  0.6 - 1.1 mg/dL   Total Bilirubin 0.46  0.20 - 1.20 mg/dL   Alkaline Phosphatase 82  40 - 150 U/L   AST 40 (*) 5 - 34 U/L   ALT 16  0 - 55 U/L   Total Protein 6.2 (*) 6.4 - 8.3 g/dL   Albumin 3.3 (*) 3.5 - 5.0 g/dL   Calcium 9.5  8.4 - 10.4 mg/dL   Anion Gap 10  3 - 11 mEq/L  MANUAL DIFFERENTIAL      Result Value Ref Range   ANC ([facility] manual diff) 3.1  1.5 - 6.5 10e3/uL   ALC 2.3  0.9 - 3.3 10e3/uL   SEG 13 (*) 38 - 77 %   Band Neutrophils 9  0 - 10 %   LYMPH 33  14 - 49 %   MONO 15 (*) 0 - 14 %   EOS 2  0 - 7 %   Basophil 5 (*) 0 - 2 %   Metamyelocytes 8 (*) 0 - 0 %   Myelocytes 14 (*) 0 - 0 %   PROMYELO 0  0 - 0 %   Blasts 1 (*) 0 - 0 %   Variant Lymph 0  0 - 0 %   Other Cell 0  0 - 0 %   nRBC 31 (*) 0 - 0 %   Polychromasia Moderate  Slight   Tear Drop Cells Many  Negative   Ovalocytes Few  Negative   Helmet Cell Few  Negative   PLT EST Decreased  Adequate    Last Vit D level 08-05-2013   27, this having been 17 in 03-2013  Studies/Results:  No results found.  Medications: I have reviewed the patient's current medications, as she and husband would be interested in omitting any extra medications. She had confusion with tramadol, is not using that and we will list as intolerance. She has never used the [employer]. She takes zyrtec daily. She is tolerating bid calcium with D well, having recently started this. She has been on weekly vit D 5000 units since ? Dec or prior, and we will check Vit D level with next labs and adjust if appropriate.   DISCUSSION: medications as above. I have mentioned that we might consider changing schedule to weekly x 3/ off x 1 for taxol if she continues to do well, as this might also allow bone marrow to recover a little if some of the cytopenias now may be related to chemo in addition to marrow  replacement by tumor. We will consider this again when I see her next.   Assessment/Plan: 1.metastatic lobular right breast cancer: history as above, clearly improved tho still obviously symptomatic, tolerating weekly palliative taxol and xgeva well. 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 She  will have taxol cycle 22 today and continue weekly as long as no problems until I see her next on June 19. Next Xgeva 04-23-14.  2.Anemia: likely multifactorial with marrow replacement, ongoing chemo and multiple blood draws. Continue hemocyte and follow. Note elevated ferritin could be acute phase reactant  3.PAC in  4.po intake improved  5.previous vertigo resolved with PT maneuvers during hospitalization late 2014  6. Living Will and HCPOA done and she will bring copies to be scanned into EMR. Husband is HCPOA. She does not want resuscitation or life support in irreversible situation. 7.Vitamin D deficiency summer and fall 2014: will recheck level with labs from [facility] on 04-23-14  Chemo orders entered. Confirmed treatment despite labs with infusion RN. Patient and husband have had all questions answered to their satisfaction and are in agreement with plan.  Gordy Levan, MD   04/16/2014, 11:09 AM

## 2014-04-16 NOTE — Telephone Encounter (Signed)
pt spouse to come by before leaving trmt room to get sch

## 2014-04-16 NOTE — Progress Notes (Signed)
OK to treat with taxol with platelets of 25K and with CMET results from last week.  VO  Dr. Bluford Kaufmann RN

## 2014-04-16 NOTE — Telephone Encounter (Signed)
Per staff message and POF I have scheduled appts.  JMW  

## 2014-04-16 NOTE — Telephone Encounter (Signed)
per pof to sch appts-sent wmail to MW to sch-will print sch after trmt

## 2014-04-16 NOTE — Patient Instructions (Signed)
[facility] Discharge Instructions for Patients Receiving Chemotherapy  Today you received the following chemotherapy agents taxol.  To help prevent nausea and vomiting after your treatment, we encourage you to take your nausea medication zofran.   If you develop nausea and vomiting that is not controlled by your nausea medication, call the clinic.   BELOW ARE SYMPTOMS THAT SHOULD BE REPORTED IMMEDIATELY:  *FEVER GREATER THAN 100.5 F  *CHILLS WITH OR WITHOUT FEVER  NAUSEA AND VOMITING THAT IS NOT CONTROLLED WITH YOUR NAUSEA MEDICATION  *UNUSUAL SHORTNESS OF BREATH  *UNUSUAL BRUISING OR BLEEDING  TENDERNESS IN MOUTH AND THROAT WITH OR WITHOUT PRESENCE OF ULCERS  *URINARY PROBLEMS  *BOWEL PROBLEMS  UNUSUAL RASH Items with * indicate a potential emergency and should be followed up as soon as possible.  Feel free to call the clinic you have any questions or concerns. The clinic phone number is [phone].    

## 2014-04-16 NOTE — Addendum Note (Signed)
Addended by: Sharlynn Oliphant A on: 04/16/2014 01:11 PM   Modules accepted: Orders

## 2014-04-21 ENCOUNTER — Other Ambulatory Visit: Payer: Self-pay

## 2014-04-21 ENCOUNTER — Encounter: Payer: Self-pay | Admitting: Oncology

## 2014-04-22 NOTE — Progress Notes (Signed)
[id number] TAXOL/XGEVA OK 04/22/14 TO 04/22/15.  I HAD TO FAX 38 PAGES TO GET [employer] TO APPROVE THIS.

## 2014-04-23 ENCOUNTER — Ambulatory Visit (HOSPITAL_BASED_OUTPATIENT_CLINIC_OR_DEPARTMENT_OTHER): Payer: 59 | Admitting: *Deleted

## 2014-04-23 ENCOUNTER — Ambulatory Visit (HOSPITAL_BASED_OUTPATIENT_CLINIC_OR_DEPARTMENT_OTHER): Payer: 59

## 2014-04-23 ENCOUNTER — Ambulatory Visit: Payer: 59

## 2014-04-23 VITALS — BP 114/55 | HR 91 | Temp 99.1°F | Resp 18

## 2014-04-23 DIAGNOSIS — C50119 Malignant neoplasm of central portion of unspecified female breast: Secondary | ICD-10-CM

## 2014-04-23 DIAGNOSIS — C7951 Secondary malignant neoplasm of bone: Secondary | ICD-10-CM

## 2014-04-23 DIAGNOSIS — C7952 Secondary malignant neoplasm of bone marrow: Secondary | ICD-10-CM

## 2014-04-23 DIAGNOSIS — E559 Vitamin D deficiency, unspecified: Secondary | ICD-10-CM

## 2014-04-23 DIAGNOSIS — C50919 Malignant neoplasm of unspecified site of unspecified female breast: Secondary | ICD-10-CM

## 2014-04-23 DIAGNOSIS — Z5111 Encounter for antineoplastic chemotherapy: Secondary | ICD-10-CM

## 2014-04-23 LAB — COMPREHENSIVE METABOLIC PANEL (CC13)
ALBUMIN: 3.4 g/dL — AB (ref 3.5–5.0)
ALT: 19 U/L (ref 0–55)
AST: 44 U/L — AB (ref 5–34)
Alkaline Phosphatase: 78 U/L (ref 40–150)
Anion Gap: 12 mEq/L — ABNORMAL HIGH (ref 3–11)
BUN: 21.1 mg/dL (ref 7.0–26.0)
CALCIUM: 9.5 mg/dL (ref 8.4–10.4)
CHLORIDE: 104 meq/L (ref 98–109)
CO2: 23 mEq/L (ref 22–29)
Creatinine: 0.8 mg/dL (ref 0.6–1.1)
Glucose: 118 mg/dl (ref 70–140)
Potassium: 3.7 mEq/L (ref 3.5–5.1)
SODIUM: 140 meq/L (ref 136–145)
TOTAL PROTEIN: 6.4 g/dL (ref 6.4–8.3)
Total Bilirubin: 0.53 mg/dL (ref 0.20–1.20)

## 2014-04-23 LAB — CBC WITH DIFFERENTIAL/PLATELET
BASO%: 9.6 % — ABNORMAL HIGH (ref 0.0–2.0)
Basophils Absolute: 0.7 10*3/uL — ABNORMAL HIGH (ref 0.0–0.1)
EOS%: 2.7 % (ref 0.0–7.0)
Eosinophils Absolute: 0.2 10*3/uL (ref 0.0–0.5)
HEMATOCRIT: 26.9 % — AB (ref 34.8–46.6)
HGB: 8.4 g/dL — ABNORMAL LOW (ref 11.6–15.9)
LYMPH#: 1.7 10*3/uL (ref 0.9–3.3)
LYMPH%: 25 % (ref 14.0–49.7)
MCH: 29 pg (ref 25.1–34.0)
MCHC: 31.2 g/dL — AB (ref 31.5–36.0)
MCV: 92.8 fL (ref 79.5–101.0)
MONO#: 1.7 10*3/uL — ABNORMAL HIGH (ref 0.1–0.9)
MONO%: 24.7 % — ABNORMAL HIGH (ref 0.0–14.0)
NEUT#: 2.6 10*3/uL (ref 1.5–6.5)
NEUT%: 38 % — AB (ref 38.4–76.8)
NRBC: 35 % — AB (ref 0–0)
Platelets: 32 10*3/uL — ABNORMAL LOW (ref 145–400)
RBC: 2.9 10*6/uL — ABNORMAL LOW (ref 3.70–5.45)
RDW: 19.4 % — ABNORMAL HIGH (ref 11.2–14.5)
WBC: 6.8 10*3/uL (ref 3.9–10.3)

## 2014-04-23 LAB — TECHNOLOGIST REVIEW: Technologist Review: 3

## 2014-04-23 MED ORDER — SODIUM CHLORIDE 0.9 % IJ SOLN
10.0000 mL | INTRAMUSCULAR | Status: DC | PRN
  Administered 2014-04-23: 10 mL
  Filled 2014-04-23: qty 10

## 2014-04-23 MED ORDER — HEPARIN SOD (PORK) LOCK FLUSH 100 UNIT/ML IV SOLN
500.0000 [IU] | Freq: Once | INTRAVENOUS | Status: AC | PRN
  Administered 2014-04-23: 500 [IU]
  Filled 2014-04-23: qty 5

## 2014-04-23 MED ORDER — ONDANSETRON 8 MG/50ML IVPB (CHCC): 8.0000 mg | Freq: Once | INTRAVENOUS | Status: DC

## 2014-04-23 MED ORDER — ONDANSETRON 8 MG/50ML IVPB (CHCC)
8.0000 mg | Freq: Once | INTRAVENOUS | Status: AC
  Administered 2014-04-23: 8 mg via INTRAVENOUS

## 2014-04-23 MED ORDER — ONDANSETRON 8 MG/NS 50 ML IVPB
INTRAVENOUS | Status: AC
  Filled 2014-04-23: qty 8

## 2014-04-23 MED ORDER — DENOSUMAB 120 MG/1.7ML ~~LOC~~ SOLN
120.0000 mg | Freq: Once | SUBCUTANEOUS | Status: AC
  Administered 2014-04-23: 120 mg via SUBCUTANEOUS
  Filled 2014-04-23: qty 1.7

## 2014-04-23 MED ORDER — DEXAMETHASONE SODIUM PHOSPHATE 20 MG/5ML IJ SOLN
INTRAMUSCULAR | Status: AC
  Filled 2014-04-23: qty 5

## 2014-04-23 MED ORDER — FAMOTIDINE IN NACL 20-0.9 MG/50ML-% IV SOLN
INTRAVENOUS | Status: AC
  Filled 2014-04-23: qty 50

## 2014-04-23 MED ORDER — SODIUM CHLORIDE 0.9 % IJ SOLN
10.0000 mL | INTRAMUSCULAR | Status: DC | PRN
  Administered 2014-04-23: 10 mL via INTRAVENOUS
  Filled 2014-04-23: qty 10

## 2014-04-23 MED ORDER — PACLITAXEL CHEMO INJECTION 300 MG/50ML
60.0000 mg/m2 | Freq: Once | INTRAVENOUS | Status: AC
  Administered 2014-04-23: 84 mg via INTRAVENOUS
  Filled 2014-04-23: qty 14

## 2014-04-23 MED ORDER — FAMOTIDINE IN NACL 20-0.9 MG/50ML-% IV SOLN
20.0000 mg | Freq: Once | INTRAVENOUS | Status: AC
  Administered 2014-04-23: 20 mg via INTRAVENOUS

## 2014-04-23 MED ORDER — SODIUM CHLORIDE 0.9 % IV SOLN
Freq: Once | INTRAVENOUS | Status: AC
  Administered 2014-04-23: 09:00:00 via INTRAVENOUS

## 2014-04-23 MED ORDER — DIPHENHYDRAMINE HCL 50 MG/ML IJ SOLN
INTRAMUSCULAR | Status: AC
  Filled 2014-04-23: qty 1

## 2014-04-23 MED ORDER — DIPHENHYDRAMINE HCL 50 MG/ML IJ SOLN
25.0000 mg | Freq: Once | INTRAMUSCULAR | Status: AC
  Administered 2014-04-23: 25 mg via INTRAVENOUS

## 2014-04-23 MED ORDER — DEXAMETHASONE SODIUM PHOSPHATE 20 MG/5ML IJ SOLN
20.0000 mg | Freq: Once | INTRAMUSCULAR | Status: AC
  Administered 2014-04-23: 20 mg via INTRAVENOUS

## 2014-04-23 NOTE — Patient Instructions (Signed)
[facility] Discharge Instructions for Patients Receiving Chemotherapy  Today you received the following chemotherapy agents: Taxol.  To help prevent nausea and vomiting after your treatment, we encourage you to take your nausea medication as prescribed.   If you develop nausea and vomiting that is not controlled by your nausea medication, call the clinic.   BELOW ARE SYMPTOMS THAT SHOULD BE REPORTED IMMEDIATELY:  *FEVER GREATER THAN 100.5 F  *CHILLS WITH OR WITHOUT FEVER  NAUSEA AND VOMITING THAT IS NOT CONTROLLED WITH YOUR NAUSEA MEDICATION  *UNUSUAL SHORTNESS OF BREATH  *UNUSUAL BRUISING OR BLEEDING  TENDERNESS IN MOUTH AND THROAT WITH OR WITHOUT PRESENCE OF ULCERS  *URINARY PROBLEMS  *BOWEL PROBLEMS  UNUSUAL RASH Items with * indicate a potential emergency and should be followed up as soon as possible.  Feel free to call the clinic you have any questions or concerns. The clinic phone number is [phone].    

## 2014-04-23 NOTE — Patient Instructions (Signed)

## 2014-04-23 NOTE — Progress Notes (Unsigned)
Gina [name] given by Infusion nurse

## 2014-04-24 LAB — VITAMIN D 25 HYDROXY (VIT D DEFICIENCY, FRACTURES): Vit D, 25-Hydroxy: 78 ng/mL (ref 30–89)

## 2014-04-26 ENCOUNTER — Telehealth: Payer: Self-pay | Admitting: *Deleted

## 2014-04-26 NOTE — Telephone Encounter (Signed)
Message copied by Patton Salles on Mon Apr 26, 2014 10:53 AM ------      Message from: Gordy Levan      Created: Mon Apr 26, 2014  6:47 AM       Labs seen and need follow up: please let her know vit D level is up into good range now with the higher doses of Vit D, at 78. She can stop present high dose D and begin 400 - 800 units daily as maintenance, which can be from Ca + D tab ------

## 2014-04-26 NOTE — Telephone Encounter (Signed)
Pt notified of results below. Verbalized understanding.  

## 2014-04-29 ENCOUNTER — Other Ambulatory Visit: Payer: Self-pay | Admitting: *Deleted

## 2014-04-29 DIAGNOSIS — C50919 Malignant neoplasm of unspecified site of unspecified female breast: Secondary | ICD-10-CM

## 2014-04-29 DIAGNOSIS — C7951 Secondary malignant neoplasm of bone: Secondary | ICD-10-CM

## 2014-04-30 ENCOUNTER — Ambulatory Visit (HOSPITAL_BASED_OUTPATIENT_CLINIC_OR_DEPARTMENT_OTHER): Payer: 59

## 2014-04-30 ENCOUNTER — Other Ambulatory Visit (HOSPITAL_BASED_OUTPATIENT_CLINIC_OR_DEPARTMENT_OTHER): Payer: 59

## 2014-04-30 ENCOUNTER — Ambulatory Visit: Payer: 59

## 2014-04-30 VITALS — BP 100/68 | HR 85 | Temp 98.3°F | Resp 18

## 2014-04-30 DIAGNOSIS — C50919 Malignant neoplasm of unspecified site of unspecified female breast: Secondary | ICD-10-CM

## 2014-04-30 DIAGNOSIS — C50119 Malignant neoplasm of central portion of unspecified female breast: Secondary | ICD-10-CM

## 2014-04-30 DIAGNOSIS — C7952 Secondary malignant neoplasm of bone marrow: Secondary | ICD-10-CM

## 2014-04-30 DIAGNOSIS — Z5111 Encounter for antineoplastic chemotherapy: Secondary | ICD-10-CM

## 2014-04-30 DIAGNOSIS — Z95828 Presence of other vascular implants and grafts: Secondary | ICD-10-CM

## 2014-04-30 DIAGNOSIS — C7951 Secondary malignant neoplasm of bone: Secondary | ICD-10-CM

## 2014-04-30 LAB — COMPREHENSIVE METABOLIC PANEL (CC13)
ALK PHOS: 80 U/L (ref 40–150)
ALT: 21 U/L (ref 0–55)
AST: 42 U/L — AB (ref 5–34)
Albumin: 3.3 g/dL — ABNORMAL LOW (ref 3.5–5.0)
Anion Gap: 12 mEq/L — ABNORMAL HIGH (ref 3–11)
BUN: 15.4 mg/dL (ref 7.0–26.0)
CO2: 23 mEq/L (ref 22–29)
CREATININE: 0.8 mg/dL (ref 0.6–1.1)
Calcium: 9.2 mg/dL (ref 8.4–10.4)
Chloride: 106 mEq/L (ref 98–109)
Glucose: 120 mg/dl (ref 70–140)
POTASSIUM: 4.2 meq/L (ref 3.5–5.1)
Sodium: 141 mEq/L (ref 136–145)
Total Bilirubin: 0.47 mg/dL (ref 0.20–1.20)
Total Protein: 6.3 g/dL — ABNORMAL LOW (ref 6.4–8.3)

## 2014-04-30 LAB — MANUAL DIFFERENTIAL
ALC: 1.6 10*3/uL (ref 0.9–3.3)
ANC (CHCC MAN DIFF): 3.3 10*3/uL (ref 1.5–6.5)
BAND NEUTROPHILS: 11 % — AB (ref 0–10)
BLASTS: 2 % — AB (ref 0–0)
Basophil: 5 % — ABNORMAL HIGH (ref 0–2)
EOS: 5 % (ref 0–7)
LYMPH: 25 % (ref 14–49)
MONO: 11 % (ref 0–14)
MYELOCYTES: 17 % — AB (ref 0–0)
Metamyelocytes: 9 % — ABNORMAL HIGH (ref 0–0)
NRBC: 25 % — AB (ref 0–0)
OTHER CELL: 0 % (ref 0–0)
PLT EST: DECREASED
PROMYELO: 0 % (ref 0–0)
SEG: 15 % — AB (ref 38–77)
VARIANT LYMPH: 0 % (ref 0–0)

## 2014-04-30 LAB — CBC WITH DIFFERENTIAL/PLATELET
HCT: 27.3 % — ABNORMAL LOW (ref 34.8–46.6)
HGB: 8.5 g/dL — ABNORMAL LOW (ref 11.6–15.9)
MCH: 28.9 pg (ref 25.1–34.0)
MCHC: 31.1 g/dL — AB (ref 31.5–36.0)
MCV: 92.9 fL (ref 79.5–101.0)
PLATELETS: 23 10*3/uL — AB (ref 145–400)
RBC: 2.94 10*6/uL — AB (ref 3.70–5.45)
RDW: 19.1 % — AB (ref 11.2–14.5)
WBC: 6.3 10*3/uL (ref 3.9–10.3)

## 2014-04-30 MED ORDER — SODIUM CHLORIDE 0.9 % IJ SOLN
10.0000 mL | INTRAMUSCULAR | Status: DC | PRN
  Administered 2014-04-30: 10 mL via INTRAVENOUS
  Filled 2014-04-30: qty 10

## 2014-04-30 MED ORDER — FAMOTIDINE IN NACL 20-0.9 MG/50ML-% IV SOLN
20.0000 mg | Freq: Once | INTRAVENOUS | Status: AC
  Administered 2014-04-30: 20 mg via INTRAVENOUS

## 2014-04-30 MED ORDER — HEPARIN SOD (PORK) LOCK FLUSH 100 UNIT/ML IV SOLN
500.0000 [IU] | Freq: Once | INTRAVENOUS | Status: AC
  Administered 2014-04-30: 500 [IU] via INTRAVENOUS
  Filled 2014-04-30: qty 5

## 2014-04-30 MED ORDER — DIPHENHYDRAMINE HCL 50 MG/ML IJ SOLN
25.0000 mg | Freq: Once | INTRAMUSCULAR | Status: AC
  Administered 2014-04-30: 25 mg via INTRAVENOUS

## 2014-04-30 MED ORDER — SODIUM CHLORIDE 0.9 % IV SOLN
Freq: Once | INTRAVENOUS | Status: AC
  Administered 2014-04-30: 10:00:00 via INTRAVENOUS

## 2014-04-30 MED ORDER — FAMOTIDINE IN NACL 20-0.9 MG/50ML-% IV SOLN
INTRAVENOUS | Status: AC
  Filled 2014-04-30: qty 50

## 2014-04-30 MED ORDER — DEXAMETHASONE SODIUM PHOSPHATE 20 MG/5ML IJ SOLN
20.0000 mg | Freq: Once | INTRAMUSCULAR | Status: AC
  Administered 2014-04-30: 20 mg via INTRAVENOUS

## 2014-04-30 MED ORDER — SODIUM CHLORIDE 0.9 % IJ SOLN
10.0000 mL | INTRAMUSCULAR | Status: DC | PRN
  Administered 2014-04-30: 10 mL
  Filled 2014-04-30: qty 10

## 2014-04-30 MED ORDER — DEXAMETHASONE SODIUM PHOSPHATE 20 MG/5ML IJ SOLN
INTRAMUSCULAR | Status: AC
  Filled 2014-04-30: qty 5

## 2014-04-30 MED ORDER — DEXTROSE 5 % IV SOLN
60.0000 mg/m2 | Freq: Once | INTRAVENOUS | Status: AC
  Administered 2014-04-30: 84 mg via INTRAVENOUS
  Filled 2014-04-30: qty 14

## 2014-04-30 MED ORDER — DIPHENHYDRAMINE HCL 50 MG/ML IJ SOLN
INTRAMUSCULAR | Status: AC
  Filled 2014-04-30: qty 1

## 2014-04-30 MED ORDER — HEPARIN SOD (PORK) LOCK FLUSH 100 UNIT/ML IV SOLN
500.0000 [IU] | Freq: Once | INTRAVENOUS | Status: AC | PRN
  Administered 2014-04-30: 500 [IU]
  Filled 2014-04-30: qty 5

## 2014-04-30 MED ORDER — ONDANSETRON 8 MG/50ML IVPB (CHCC)
8.0000 mg | Freq: Once | INTRAVENOUS | Status: AC
Start: 2014-04-30 — End: 2014-04-30
  Administered 2014-04-30: 8 mg via INTRAVENOUS

## 2014-04-30 MED ORDER — ONDANSETRON 8 MG/NS 50 ML IVPB
INTRAVENOUS | Status: AC
  Filled 2014-04-30: qty 8

## 2014-04-30 NOTE — Patient Instructions (Signed)
[facility] Discharge Instructions for Patients Receiving Chemotherapy  Today you received the following chemotherapy agents Taxol  To help prevent nausea and vomiting after your treatment, we encourage you to take your nausea medication    If you develop nausea and vomiting that is not controlled by your nausea medication, call the clinic.   BELOW ARE SYMPTOMS THAT SHOULD BE REPORTED IMMEDIATELY:  *FEVER GREATER THAN 100.5 F  *CHILLS WITH OR WITHOUT FEVER  NAUSEA AND VOMITING THAT IS NOT CONTROLLED WITH YOUR NAUSEA MEDICATION  *UNUSUAL SHORTNESS OF BREATH  *UNUSUAL BRUISING OR BLEEDING  TENDERNESS IN MOUTH AND THROAT WITH OR WITHOUT PRESENCE OF ULCERS  *URINARY PROBLEMS  *BOWEL PROBLEMS  UNUSUAL RASH Items with * indicate a potential emergency and should be followed up as soon as possible.  Feel free to call the clinic you have any questions or concerns. The clinic phone number is [phone].    

## 2014-04-30 NOTE — Progress Notes (Signed)
Per office note; ok to treat with single agent Taxol if platelets >or = to 20. No active bleeding reported per patient.

## 2014-05-07 ENCOUNTER — Other Ambulatory Visit: Payer: Self-pay

## 2014-05-07 ENCOUNTER — Other Ambulatory Visit: Payer: 59

## 2014-05-07 ENCOUNTER — Other Ambulatory Visit: Payer: Self-pay | Admitting: Oncology

## 2014-05-07 ENCOUNTER — Ambulatory Visit (HOSPITAL_BASED_OUTPATIENT_CLINIC_OR_DEPARTMENT_OTHER): Payer: 59

## 2014-05-07 VITALS — BP 96/63 | HR 109 | Temp 98.2°F | Resp 16

## 2014-05-07 DIAGNOSIS — C50119 Malignant neoplasm of central portion of unspecified female breast: Secondary | ICD-10-CM

## 2014-05-07 DIAGNOSIS — C50919 Malignant neoplasm of unspecified site of unspecified female breast: Secondary | ICD-10-CM

## 2014-05-07 DIAGNOSIS — Z5111 Encounter for antineoplastic chemotherapy: Secondary | ICD-10-CM

## 2014-05-07 DIAGNOSIS — D696 Thrombocytopenia, unspecified: Secondary | ICD-10-CM

## 2014-05-07 DIAGNOSIS — Z95828 Presence of other vascular implants and grafts: Secondary | ICD-10-CM

## 2014-05-07 LAB — CBC WITH DIFFERENTIAL/PLATELET
HCT: 28.3 % — ABNORMAL LOW (ref 34.8–46.6)
HEMOGLOBIN: 8.8 g/dL — AB (ref 11.6–15.9)
MCH: 28.9 pg (ref 25.1–34.0)
MCHC: 31.1 g/dL — ABNORMAL LOW (ref 31.5–36.0)
MCV: 92.8 fL (ref 79.5–101.0)
PLATELETS: 27 10*3/uL — AB (ref 145–400)
RBC: 3.05 10*6/uL — ABNORMAL LOW (ref 3.70–5.45)
RDW: 18.7 % — ABNORMAL HIGH (ref 11.2–14.5)
WBC: 7 10*3/uL (ref 3.9–10.3)

## 2014-05-07 LAB — COMPREHENSIVE METABOLIC PANEL (CC13)
ALT: 19 U/L (ref 0–55)
ANION GAP: 10 meq/L (ref 3–11)
AST: 43 U/L — ABNORMAL HIGH (ref 5–34)
Albumin: 3.4 g/dL — ABNORMAL LOW (ref 3.5–5.0)
Alkaline Phosphatase: 84 U/L (ref 40–150)
BILIRUBIN TOTAL: 0.53 mg/dL (ref 0.20–1.20)
BUN: 16.6 mg/dL (ref 7.0–26.0)
CALCIUM: 9.5 mg/dL (ref 8.4–10.4)
CO2: 27 mEq/L (ref 22–29)
CREATININE: 0.8 mg/dL (ref 0.6–1.1)
Chloride: 104 mEq/L (ref 98–109)
Glucose: 124 mg/dl (ref 70–140)
Potassium: 4 mEq/L (ref 3.5–5.1)
Sodium: 140 mEq/L (ref 136–145)
Total Protein: 6.5 g/dL (ref 6.4–8.3)

## 2014-05-07 LAB — MANUAL DIFFERENTIAL
ALC: 2.4 10*3/uL (ref 0.9–3.3)
ANC (CHCC MAN DIFF): 3.6 10*3/uL (ref 1.5–6.5)
BAND NEUTROPHILS: 7 % (ref 0–10)
BLASTS: 3 % — AB (ref 0–0)
Basophil: 4 % — ABNORMAL HIGH (ref 0–2)
EOS: 1 % (ref 0–7)
LYMPH: 35 % (ref 14–49)
MONO: 6 % (ref 0–14)
MYELOCYTES: 19 % — AB (ref 0–0)
Metamyelocytes: 4 % — ABNORMAL HIGH (ref 0–0)
NRBC: 32 % — AB (ref 0–0)
OTHER CELL: 0 % (ref 0–0)
PLT EST: DECREASED
PROMYELO: 0 % (ref 0–0)
SEG: 21 % — ABNORMAL LOW (ref 38–77)
Variant Lymph: 0 % (ref 0–0)

## 2014-05-07 MED ORDER — DEXTROSE 5 % IV SOLN
60.0000 mg/m2 | Freq: Once | INTRAVENOUS | Status: AC
  Administered 2014-05-07: 84 mg via INTRAVENOUS
  Filled 2014-05-07: qty 14

## 2014-05-07 MED ORDER — SODIUM CHLORIDE 0.9 % IV SOLN
Freq: Once | INTRAVENOUS | Status: AC
  Administered 2014-05-07: 09:00:00 via INTRAVENOUS

## 2014-05-07 MED ORDER — DEXAMETHASONE SODIUM PHOSPHATE 20 MG/5ML IJ SOLN
20.0000 mg | Freq: Once | INTRAMUSCULAR | Status: AC
Start: 2014-05-07 — End: 2014-05-07
  Administered 2014-05-07: 20 mg via INTRAVENOUS

## 2014-05-07 MED ORDER — SODIUM CHLORIDE 0.9 % IJ SOLN
10.0000 mL | INTRAMUSCULAR | Status: DC | PRN
  Administered 2014-05-07: 10 mL via INTRAVENOUS
  Filled 2014-05-07: qty 10

## 2014-05-07 MED ORDER — DIPHENHYDRAMINE HCL 50 MG/ML IJ SOLN
25.0000 mg | Freq: Once | INTRAMUSCULAR | Status: AC
  Administered 2014-05-07: 25 mg via INTRAVENOUS

## 2014-05-07 MED ORDER — FAMOTIDINE IN NACL 20-0.9 MG/50ML-% IV SOLN
INTRAVENOUS | Status: AC
  Filled 2014-05-07: qty 50

## 2014-05-07 MED ORDER — DRONABINOL 2.5 MG PO CAPS: 2.5000 mg | ORAL_CAPSULE | Freq: Two times a day (BID) | ORAL | Status: DC

## 2014-05-07 MED ORDER — ONDANSETRON 8 MG/50ML IVPB (CHCC)
8.0000 mg | Freq: Once | INTRAVENOUS | Status: AC
  Administered 2014-05-07: 8 mg via INTRAVENOUS

## 2014-05-07 MED ORDER — DEXAMETHASONE SODIUM PHOSPHATE 20 MG/5ML IJ SOLN
INTRAMUSCULAR | Status: AC
  Filled 2014-05-07: qty 5

## 2014-05-07 MED ORDER — SODIUM CHLORIDE 0.9 % IJ SOLN
10.0000 mL | INTRAMUSCULAR | Status: DC | PRN
  Administered 2014-05-07: 10 mL
  Filled 2014-05-07: qty 10

## 2014-05-07 MED ORDER — FAMOTIDINE IN NACL 20-0.9 MG/50ML-% IV SOLN
20.0000 mg | Freq: Once | INTRAVENOUS | Status: AC
  Administered 2014-05-07: 20 mg via INTRAVENOUS

## 2014-05-07 MED ORDER — HEPARIN SOD (PORK) LOCK FLUSH 100 UNIT/ML IV SOLN
500.0000 [IU] | Freq: Once | INTRAVENOUS | Status: AC | PRN
  Administered 2014-05-07: 500 [IU]
  Filled 2014-05-07: qty 5

## 2014-05-07 MED ORDER — ONDANSETRON 8 MG/NS 50 ML IVPB
INTRAVENOUS | Status: AC
  Filled 2014-05-07: qty 8

## 2014-05-07 MED ORDER — DIPHENHYDRAMINE HCL 50 MG/ML IJ SOLN
INTRAMUSCULAR | Status: AC
  Filled 2014-05-07: qty 1

## 2014-05-07 NOTE — Progress Notes (Signed)
Platelets 27 verbal report from lab. OK to treat with platelets >=20 per Dr Marko Plume. Pt has no s/s of bleeding.

## 2014-05-07 NOTE — Patient Instructions (Signed)
[facility] Discharge Instructions for Patients Receiving Chemotherapy  Today you received the following chemotherapy agents TAXOL  To help prevent nausea and vomiting after your treatment, we encourage you to take your nausea medication IF NEEDED   If you develop nausea and vomiting that is not controlled by your nausea medication, call the clinic.   BELOW ARE SYMPTOMS THAT SHOULD BE REPORTED IMMEDIATELY:  *FEVER GREATER THAN 100.5 F  *CHILLS WITH OR WITHOUT FEVER  NAUSEA AND VOMITING THAT IS NOT CONTROLLED WITH YOUR NAUSEA MEDICATION  *UNUSUAL SHORTNESS OF BREATH  *UNUSUAL BRUISING OR BLEEDING  TENDERNESS IN MOUTH AND THROAT WITH OR WITHOUT PRESENCE OF ULCERS  *URINARY PROBLEMS  *BOWEL PROBLEMS  UNUSUAL RASH Items with * indicate a potential emergency and should be followed up as soon as possible.  Feel free to call the clinic you have any questions or concerns. The clinic phone number is [phone].    

## 2014-05-07 NOTE — Patient Instructions (Signed)

## 2014-05-11 ENCOUNTER — Other Ambulatory Visit: Payer: Self-pay | Admitting: Oncology

## 2014-05-11 DIAGNOSIS — C7951 Secondary malignant neoplasm of bone: Secondary | ICD-10-CM

## 2014-05-11 DIAGNOSIS — C50919 Malignant neoplasm of unspecified site of unspecified female breast: Secondary | ICD-10-CM

## 2014-05-12 ENCOUNTER — Other Ambulatory Visit: Payer: Self-pay | Admitting: Oncology

## 2014-05-13 ENCOUNTER — Other Ambulatory Visit: Payer: Self-pay | Admitting: Oncology

## 2014-05-13 ENCOUNTER — Encounter: Payer: Self-pay | Admitting: Oncology

## 2014-05-14 ENCOUNTER — Telehealth: Payer: Self-pay

## 2014-05-14 ENCOUNTER — Telehealth: Payer: Self-pay | Admitting: Oncology

## 2014-05-14 ENCOUNTER — Ambulatory Visit (HOSPITAL_BASED_OUTPATIENT_CLINIC_OR_DEPARTMENT_OTHER): Payer: 59

## 2014-05-14 ENCOUNTER — Ambulatory Visit: Payer: 59

## 2014-05-14 ENCOUNTER — Ambulatory Visit (HOSPITAL_BASED_OUTPATIENT_CLINIC_OR_DEPARTMENT_OTHER): Payer: 59 | Admitting: Oncology

## 2014-05-14 ENCOUNTER — Other Ambulatory Visit (HOSPITAL_BASED_OUTPATIENT_CLINIC_OR_DEPARTMENT_OTHER): Payer: 59

## 2014-05-14 ENCOUNTER — Encounter: Payer: Self-pay | Admitting: Oncology

## 2014-05-14 VITALS — BP 132/75 | HR 108 | Temp 98.9°F | Resp 20 | Ht 63.0 in | Wt 114.4 lb

## 2014-05-14 DIAGNOSIS — C50019 Malignant neoplasm of nipple and areola, unspecified female breast: Secondary | ICD-10-CM

## 2014-05-14 DIAGNOSIS — Z95828 Presence of other vascular implants and grafts: Secondary | ICD-10-CM

## 2014-05-14 DIAGNOSIS — C7952 Secondary malignant neoplasm of bone marrow: Secondary | ICD-10-CM

## 2014-05-14 DIAGNOSIS — C7951 Secondary malignant neoplasm of bone: Secondary | ICD-10-CM

## 2014-05-14 DIAGNOSIS — Z5111 Encounter for antineoplastic chemotherapy: Secondary | ICD-10-CM

## 2014-05-14 DIAGNOSIS — C50919 Malignant neoplasm of unspecified site of unspecified female breast: Secondary | ICD-10-CM

## 2014-05-14 DIAGNOSIS — D649 Anemia, unspecified: Secondary | ICD-10-CM

## 2014-05-14 DIAGNOSIS — D696 Thrombocytopenia, unspecified: Secondary | ICD-10-CM

## 2014-05-14 DIAGNOSIS — C50911 Malignant neoplasm of unspecified site of right female breast: Secondary | ICD-10-CM

## 2014-05-14 LAB — CBC WITH DIFFERENTIAL/PLATELET
HCT: 28.1 % — ABNORMAL LOW (ref 34.8–46.6)
HGB: 9.1 g/dL — ABNORMAL LOW (ref 11.6–15.9)
MCH: 29.5 pg (ref 25.1–34.0)
MCHC: 32.3 g/dL (ref 31.5–36.0)
MCV: 91.3 fL (ref 79.5–101.0)
PLATELETS: 30 10*3/uL — AB (ref 145–400)
RBC: 3.07 10*6/uL — ABNORMAL LOW (ref 3.70–5.45)
RDW: 18.6 % — ABNORMAL HIGH (ref 11.2–14.5)
WBC: 8.5 10*3/uL (ref 3.9–10.3)

## 2014-05-14 LAB — MANUAL DIFFERENTIAL
ALC: 2.6 10*3/uL (ref 0.9–3.3)
ANC (CHCC manual diff): 4 10*3/uL (ref 1.5–6.5)
BASOPHIL: 5 % — AB (ref 0–2)
Band Neutrophils: 6 % (ref 0–10)
Blasts: 2 % — ABNORMAL HIGH (ref 0–0)
EOS%: 2 % (ref 0–7)
LYMPH: 30 % (ref 14–49)
METAMYELOCYTES PCT: 8 % — AB (ref 0–0)
MONO: 14 % (ref 0–14)
MYELOCYTES: 16 % — AB (ref 0–0)
Other Cell: 0 % (ref 0–0)
PLT EST: DECREASED
PROMYELO: 0 % (ref 0–0)
SEG: 17 % — ABNORMAL LOW (ref 38–77)
Variant Lymph: 0 % (ref 0–0)
nRBC: 39 % — ABNORMAL HIGH (ref 0–0)

## 2014-05-14 LAB — COMPREHENSIVE METABOLIC PANEL (CC13)
ALBUMIN: 3.4 g/dL — AB (ref 3.5–5.0)
ALT: 22 U/L (ref 0–55)
AST: 46 U/L — ABNORMAL HIGH (ref 5–34)
Alkaline Phosphatase: 78 U/L (ref 40–150)
Anion Gap: 9 mEq/L (ref 3–11)
BUN: 13.2 mg/dL (ref 7.0–26.0)
CALCIUM: 9.4 mg/dL (ref 8.4–10.4)
CHLORIDE: 104 meq/L (ref 98–109)
CO2: 27 mEq/L (ref 22–29)
Creatinine: 0.8 mg/dL (ref 0.6–1.1)
Glucose: 108 mg/dl (ref 70–140)
POTASSIUM: 4.2 meq/L (ref 3.5–5.1)
Sodium: 140 mEq/L (ref 136–145)
Total Bilirubin: 0.64 mg/dL (ref 0.20–1.20)
Total Protein: 6.4 g/dL (ref 6.4–8.3)

## 2014-05-14 MED ORDER — HEPARIN SOD (PORK) LOCK FLUSH 100 UNIT/ML IV SOLN
500.0000 [IU] | Freq: Once | INTRAVENOUS | Status: AC | PRN
  Administered 2014-05-14: 500 [IU]
  Filled 2014-05-14: qty 5

## 2014-05-14 MED ORDER — DEXAMETHASONE SODIUM PHOSPHATE 20 MG/5ML IJ SOLN
20.0000 mg | Freq: Once | INTRAMUSCULAR | Status: AC
  Administered 2014-05-14: 20 mg via INTRAVENOUS

## 2014-05-14 MED ORDER — DIPHENHYDRAMINE HCL 50 MG/ML IJ SOLN
25.0000 mg | Freq: Once | INTRAMUSCULAR | Status: AC
  Administered 2014-05-14: 25 mg via INTRAVENOUS

## 2014-05-14 MED ORDER — ONDANSETRON 8 MG/50ML IVPB (CHCC)
8.0000 mg | Freq: Once | INTRAVENOUS | Status: AC
  Administered 2014-05-14: 8 mg via INTRAVENOUS

## 2014-05-14 MED ORDER — FAMOTIDINE IN NACL 20-0.9 MG/50ML-% IV SOLN
20.0000 mg | Freq: Once | INTRAVENOUS | Status: AC
  Administered 2014-05-14: 20 mg via INTRAVENOUS

## 2014-05-14 MED ORDER — PACLITAXEL CHEMO INJECTION 300 MG/50ML
60.0000 mg/m2 | Freq: Once | INTRAVENOUS | Status: AC
  Administered 2014-05-14: 84 mg via INTRAVENOUS
  Filled 2014-05-14: qty 14

## 2014-05-14 MED ORDER — ONDANSETRON 8 MG/NS 50 ML IVPB
INTRAVENOUS | Status: AC
  Filled 2014-05-14: qty 8

## 2014-05-14 MED ORDER — SODIUM CHLORIDE 0.9 % IV SOLN
Freq: Once | INTRAVENOUS | Status: AC
  Administered 2014-05-14: 10:00:00 via INTRAVENOUS

## 2014-05-14 MED ORDER — DEXAMETHASONE SODIUM PHOSPHATE 20 MG/5ML IJ SOLN
INTRAMUSCULAR | Status: AC
  Filled 2014-05-14: qty 5

## 2014-05-14 MED ORDER — DIPHENHYDRAMINE HCL 50 MG/ML IJ SOLN
INTRAMUSCULAR | Status: AC
  Filled 2014-05-14: qty 1

## 2014-05-14 MED ORDER — SODIUM CHLORIDE 0.9 % IJ SOLN
10.0000 mL | INTRAMUSCULAR | Status: DC | PRN
  Administered 2014-05-14: 10 mL
  Filled 2014-05-14: qty 10

## 2014-05-14 MED ORDER — FAMOTIDINE IN NACL 20-0.9 MG/50ML-% IV SOLN
INTRAVENOUS | Status: AC
  Filled 2014-05-14: qty 50

## 2014-05-14 MED ORDER — SODIUM CHLORIDE 0.9 % IJ SOLN
10.0000 mL | INTRAMUSCULAR | Status: DC | PRN
Start: 2014-05-14 — End: 2014-05-14
  Administered 2014-05-14: 10 mL via INTRAVENOUS
  Filled 2014-05-14: qty 10

## 2014-05-14 NOTE — Patient Instructions (Signed)

## 2014-05-14 NOTE — Patient Instructions (Signed)
[facility] Discharge Instructions for Patients Receiving Chemotherapy  Today you received the following chemotherapy agents Taxol  To help prevent nausea and vomiting after your treatment, we encourage you to take your nausea medication    If you develop nausea and vomiting that is not controlled by your nausea medication, call the clinic.   BELOW ARE SYMPTOMS THAT SHOULD BE REPORTED IMMEDIATELY:  *FEVER GREATER THAN 100.5 F  *CHILLS WITH OR WITHOUT FEVER  NAUSEA AND VOMITING THAT IS NOT CONTROLLED WITH YOUR NAUSEA MEDICATION  *UNUSUAL SHORTNESS OF BREATH  *UNUSUAL BRUISING OR BLEEDING  TENDERNESS IN MOUTH AND THROAT WITH OR WITHOUT PRESENCE OF ULCERS  *URINARY PROBLEMS  *BOWEL PROBLEMS  UNUSUAL RASH Items with * indicate a potential emergency and should be followed up as soon as possible.  Feel free to call the clinic you have any questions or concerns. The clinic phone number is [phone].    

## 2014-05-14 NOTE — Telephone Encounter (Signed)
Faxed ltr to [facility] re: continuing treatment/out of work.  Sent to scan.

## 2014-05-14 NOTE — Telephone Encounter (Signed)
per pof to sch pt appt-per reply from [place] [facility] sch-cld pt and left message of appts and adv i would mail sch to address on file

## 2014-05-14 NOTE — Telephone Encounter (Signed)
per pof to sch pt appt-sent emailt o MW to sch trmts-will call pt after reply

## 2014-05-14 NOTE — Progress Notes (Signed)
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 She clinically continues to do well, with no pain, improvement in energy such that she is up now all day at home, good appetite, no peripheral neuropathy. Despite ongoing thrombocytopenia she has had no overt bleeding; the low dose taxol is being used as long as platelets are >=20k. She is due cycle 26 today.  We have discussed changing the weekly taxol to days 12-03-13 every 28 days, which she and family like very much. She will have first skip week on July 3.   She has PAC.  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 FMLA is completed and she is using up accumulated leave time now).   ONCOLOGIC HISTORY #1 patient presented with a subareolar mass in her right breast in October 2007, with ultrasound-guided biopsy 08/23/2006 showing invasive mammary carcinoma with lobular features. ER +100% PR 3% proliferation marker Ki-67 12% HER-2/neu 2+ by fish. MRI breasts 08/30/2006 showed an ill-defined enhancing mass in the right breast measuring 2.6 x 4.0 x 4.1 cm. Enhancement trailed out to the nipple but did not involve the nipple. There was also a small enhancing internal mammary node measuring 7 mm suspicious for metastatic disease. Left breast was negative.  #2 patient received neoadjuvant chemotherapy by Dr Eston Esters with [facility] regimen given dose dense for 4 cycles followed by Taxotere, from 09/20/2006 through February 2008. She had lumpectomy 02/07/2007. However there was essentially residual tumor dispersed over an area 1.8 cm with some close surgical margins at the medial area, and she underwent a  mastectomy on 03/19/2007.  #3 she was seen by radiation oncology, did not receive radiation.  #4 Femara 2.5 mg daily, begun after chemotherapy and completed 5 years of therapy in June 2014.  #5. 08/05/13 for evaluation had new anemia. PET/CT chest abdomen and pelvis identified diffuse lytic lesions throughout the axial and appendicular skeleton. Bone marrow biopsy was consistent with metastatic ER/PR positive breast cancer.  #6 status post Arimidex  #7 palliative single agent Taxol begun weekly starting 11-06-13, cycle 21 on 04-09-14.  Review of systems as above, also: No fever or symptoms of infection. Appetite good. Bowels ok. Sleeps well at night. No problems with PAC.  Remainder of 10 point Review of Systems negative.  Objective:  Vital signs in last 24 hours:  BP 132/75  Pulse 108  Temp(Src) 98.9 F (37.2 C) (Oral)  Resp 20  Ht _0  (1.6 m)  Wt 114 lb 6.4 oz (51.891 kg)  BMI 20.27 kg/m2 weight is stable.  Alert, oriented and appropriate. Ambulatory without assistance. Neatly groomed, looks comfortable, remains somewhat pale, very pleasant as always. Alopecia  HEENT:PERRL, sclerae not icteric. Oral mucosa moist without lesions, posterior pharynx clear.  Neck supple. No JVD.  Lymphatics:no cervical,suraclavicular adenopathy Resp: clear to auscultation bilaterally and normal percussion bilaterally Cardio: regular rate and rhythm. No gallop. GI: soft, nontender, not distended, no mass or organomegaly. Normally active bowel sounds.  Musculoskeletal/ Extremities: without pitting edema, cords, tenderness Neuro: no peripheral neuropathy. Otherwise nonfocal Skin without rash, ecchymosis, petechiae Breasts: right mastectomy Portacath-without erythema or tenderness, accessed in lab this AM  Lab Results:  Results for  orders placed in visit on 05/14/14  CBC WITH DIFFERENTIAL      Result Value Ref Range   WBC 8.5  3.9 - 10.3 10e3/uL   HGB 9.1 (*) 11.6 - 15.9 g/dL   HCT 28.1 (*)  34.8 - 46.6 %   Platelets 30 (*) 145 - 400 10e3/uL   MCV 91.3  79.5 - 101.0 fL   MCH 29.5  25.1 - 34.0 pg   MCHC 32.3  31.5 - 36.0 g/dL   RBC 3.07 (*) 3.70 - 5.45 10e6/uL   RDW 18.6 (*) 11.2 - 14.5 %  COMPREHENSIVE METABOLIC PANEL (IO96)      Result Value Ref Range   Sodium 140  136 - 145 mEq/L   Potassium 4.2  3.5 - 5.1 mEq/L   Chloride 104  98 - 109 mEq/L   CO2 27  22 - 29 mEq/L   Glucose 108  70 - 140 mg/dl   BUN 13.2  7.0 - 26.0 mg/dL   Creatinine 0.8  0.6 - 1.1 mg/dL   Total Bilirubin 0.64  0.20 - 1.20 mg/dL   Alkaline Phosphatase 78  40 - 150 U/L   AST 46 (*) 5 - 34 U/L   ALT 22  0 - 55 U/L   Total Protein 6.4  6.4 - 8.3 g/dL   Albumin 3.4 (*) 3.5 - 5.0 g/dL   Calcium 9.4  8.4 - 10.4 mg/dL   Anion Gap 9  3 - 11 mEq/L  MANUAL DIFFERENTIAL      Result Value Ref Range   ANC ([facility] manual diff) 4.0  1.5 - 6.5 10e3/uL   ALC 2.6  0.9 - 3.3 10e3/uL   SEG 17 (*) 38 - 77 %   Band Neutrophils 6  0 - 10 %   LYMPH 30  14 - 49 %   MONO 14  0 - 14 %   EOS 2  0 - 7 %   Basophil 5 (*) 0 - 2 %   Metamyelocytes 8 (*) 0 - 0 %   Myelocytes 16 (*) 0 - 0 %   PROMYELO 0  0 - 0 %   Blasts 2 (*) 0 - 0 %   Variant Lymph 0  0 - 0 %   Other Cell 0  0 - 0 %   nRBC 39 (*) 0 - 0 %   Polychromasia Marked  Slight   Tear Drop Cells Many  Negative   Ovalocytes Few  Negative   PLT EST Decreased  Adequate     Studies/Results:  No results found.  Medications: I have reviewed the patient's current medications. She has decreased Vitamin D to ~ 400 - 800 units daily, as Vit D level on 5-29 was 78. She has needed no pain medication in several months.  DISCUSSION: will decrease frequency of the taxol from every week to 3 weeks on/ one week off, first skip week to be July 3. She will continue monthly xgeva, due again 5-26. She will be seen by provider ~ every 3-4 weeks and as needed.  Assessment/Plan: 1.metastatic lobular right breast cancer extensively involving bone: history as above, clearly  improved on low dose palliative taxol and xgeva. 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 Will decrease frequency of taxol slightly as above. 2.Anemia: likely multifactorial with marrow replacement, ongoing chemo and multiple blood draws. Continue hemocyte and follow. Note elevated ferritin could  be acute phase reactant  3.PAC in  4.po intake good now, on marinol 5.previous vertigo resolved with PT maneuvers during hospitalization late 2014  6. Living Will and HCPOA done and she will bring copies to be scanned into EMR. Husband is HCPOA. She does not want resuscitation or life support in irreversible situation.  7.Vitamin D deficiency summer and fall 2014:resolved. Needs just maintenance Vit D now.   Patient and family are pleased with her status and plan now. Chemo orders confirmed.     LIVESAY,LENNIS P, MD   05/14/2014, 8:59 AM

## 2014-05-15 ENCOUNTER — Other Ambulatory Visit: Payer: Self-pay | Admitting: Oncology

## 2014-05-21 ENCOUNTER — Other Ambulatory Visit: Payer: 59

## 2014-05-21 ENCOUNTER — Ambulatory Visit: Payer: 59

## 2014-05-21 ENCOUNTER — Ambulatory Visit (HOSPITAL_BASED_OUTPATIENT_CLINIC_OR_DEPARTMENT_OTHER): Payer: 59

## 2014-05-21 VITALS — BP 134/64 | HR 102 | Temp 97.8°F | Resp 18

## 2014-05-21 DIAGNOSIS — C7952 Secondary malignant neoplasm of bone marrow: Secondary | ICD-10-CM

## 2014-05-21 DIAGNOSIS — C50919 Malignant neoplasm of unspecified site of unspecified female breast: Secondary | ICD-10-CM

## 2014-05-21 DIAGNOSIS — Z95828 Presence of other vascular implants and grafts: Secondary | ICD-10-CM

## 2014-05-21 DIAGNOSIS — C7951 Secondary malignant neoplasm of bone: Secondary | ICD-10-CM

## 2014-05-21 DIAGNOSIS — C50119 Malignant neoplasm of central portion of unspecified female breast: Secondary | ICD-10-CM

## 2014-05-21 DIAGNOSIS — Z5111 Encounter for antineoplastic chemotherapy: Secondary | ICD-10-CM

## 2014-05-21 DIAGNOSIS — C50911 Malignant neoplasm of unspecified site of right female breast: Secondary | ICD-10-CM

## 2014-05-21 LAB — CBC WITH DIFFERENTIAL/PLATELET
HCT: 27.8 % — ABNORMAL LOW (ref 34.8–46.6)
HEMOGLOBIN: 8.6 g/dL — AB (ref 11.6–15.9)
MCH: 28.8 pg (ref 25.1–34.0)
MCHC: 30.9 g/dL — ABNORMAL LOW (ref 31.5–36.0)
MCV: 93 fL (ref 79.5–101.0)
Platelets: 21 10*3/uL — ABNORMAL LOW (ref 145–400)
RBC: 2.99 10*6/uL — ABNORMAL LOW (ref 3.70–5.45)
RDW: 18.5 % — AB (ref 11.2–14.5)
WBC: 6.2 10*3/uL (ref 3.9–10.3)

## 2014-05-21 LAB — MANUAL DIFFERENTIAL
ALC: 2.2 10*3/uL (ref 0.9–3.3)
ANC (CHCC manual diff): 3 10*3/uL (ref 1.5–6.5)
Band Neutrophils: 12 % — ABNORMAL HIGH (ref 0–10)
Basophil: 4 % — ABNORMAL HIGH (ref 0–2)
Blasts: 2 % — ABNORMAL HIGH (ref 0–0)
EOS%: 2 % (ref 0–7)
LYMPH: 36 % (ref 14–49)
MONO: 7 % (ref 0–14)
MYELOCYTES: 16 % — AB (ref 0–0)
Metamyelocytes: 10 % — ABNORMAL HIGH (ref 0–0)
Other Cell: 0 % (ref 0–0)
PLT EST: DECREASED
PROMYELO: 0 % (ref 0–0)
SEG: 11 % — ABNORMAL LOW (ref 38–77)
Variant Lymph: 0 % (ref 0–0)
nRBC: 22 % — ABNORMAL HIGH (ref 0–0)

## 2014-05-21 LAB — COMPREHENSIVE METABOLIC PANEL (CC13)
ALT: 22 U/L (ref 0–55)
ANION GAP: 10 meq/L (ref 3–11)
AST: 47 U/L — ABNORMAL HIGH (ref 5–34)
Albumin: 3.5 g/dL (ref 3.5–5.0)
Alkaline Phosphatase: 81 U/L (ref 40–150)
BUN: 13.3 mg/dL (ref 7.0–26.0)
CALCIUM: 9.7 mg/dL (ref 8.4–10.4)
CHLORIDE: 104 meq/L (ref 98–109)
CO2: 26 mEq/L (ref 22–29)
Creatinine: 0.8 mg/dL (ref 0.6–1.1)
Glucose: 102 mg/dl (ref 70–140)
Potassium: 4.5 mEq/L (ref 3.5–5.1)
SODIUM: 141 meq/L (ref 136–145)
TOTAL PROTEIN: 6.4 g/dL (ref 6.4–8.3)
Total Bilirubin: 0.58 mg/dL (ref 0.20–1.20)

## 2014-05-21 MED ORDER — ONDANSETRON 8 MG/50ML IVPB (CHCC)
8.0000 mg | Freq: Once | INTRAVENOUS | Status: AC
  Administered 2014-05-21: 8 mg via INTRAVENOUS

## 2014-05-21 MED ORDER — DIPHENHYDRAMINE HCL 50 MG/ML IJ SOLN
25.0000 mg | Freq: Once | INTRAMUSCULAR | Status: AC
  Administered 2014-05-21: 25 mg via INTRAVENOUS

## 2014-05-21 MED ORDER — DEXAMETHASONE SODIUM PHOSPHATE 20 MG/5ML IJ SOLN
INTRAMUSCULAR | Status: AC
  Filled 2014-05-21: qty 5

## 2014-05-21 MED ORDER — SODIUM CHLORIDE 0.9 % IV SOLN
Freq: Once | INTRAVENOUS | Status: AC
  Administered 2014-05-21: 09:00:00 via INTRAVENOUS

## 2014-05-21 MED ORDER — PACLITAXEL CHEMO INJECTION 300 MG/50ML
60.0000 mg/m2 | Freq: Once | INTRAVENOUS | Status: AC
  Administered 2014-05-21: 84 mg via INTRAVENOUS
  Filled 2014-05-21: qty 14

## 2014-05-21 MED ORDER — ONDANSETRON 8 MG/NS 50 ML IVPB
INTRAVENOUS | Status: AC
  Filled 2014-05-21: qty 8

## 2014-05-21 MED ORDER — FAMOTIDINE IN NACL 20-0.9 MG/50ML-% IV SOLN
INTRAVENOUS | Status: AC
  Filled 2014-05-21: qty 50

## 2014-05-21 MED ORDER — DENOSUMAB 120 MG/1.7ML ~~LOC~~ SOLN
120.0000 mg | Freq: Once | SUBCUTANEOUS | Status: AC
  Administered 2014-05-21: 120 mg via SUBCUTANEOUS
  Filled 2014-05-21: qty 1.7

## 2014-05-21 MED ORDER — DIPHENHYDRAMINE HCL 50 MG/ML IJ SOLN
INTRAMUSCULAR | Status: AC
  Filled 2014-05-21: qty 1

## 2014-05-21 MED ORDER — SODIUM CHLORIDE 0.9 % IJ SOLN
10.0000 mL | INTRAMUSCULAR | Status: DC | PRN
  Administered 2014-05-21: 10 mL
  Filled 2014-05-21: qty 10

## 2014-05-21 MED ORDER — DEXAMETHASONE SODIUM PHOSPHATE 20 MG/5ML IJ SOLN
20.0000 mg | Freq: Once | INTRAMUSCULAR | Status: AC
  Administered 2014-05-21: 20 mg via INTRAVENOUS

## 2014-05-21 MED ORDER — HEPARIN SOD (PORK) LOCK FLUSH 100 UNIT/ML IV SOLN
500.0000 [IU] | Freq: Once | INTRAVENOUS | Status: AC | PRN
  Administered 2014-05-21: 500 [IU]
  Filled 2014-05-21: qty 5

## 2014-05-21 MED ORDER — SODIUM CHLORIDE 0.9 % IJ SOLN
10.0000 mL | INTRAMUSCULAR | Status: DC | PRN
  Administered 2014-05-21: 10 mL via INTRAVENOUS
  Filled 2014-05-21: qty 10

## 2014-05-21 MED ORDER — FAMOTIDINE IN NACL 20-0.9 MG/50ML-% IV SOLN
20.0000 mg | Freq: Once | INTRAVENOUS | Status: AC
  Administered 2014-05-21: 20 mg via INTRAVENOUS

## 2014-05-21 NOTE — Patient Instructions (Signed)
[facility] Discharge Instructions for Patients Receiving Chemotherapy  Today you received the following chemotherapy agents Taxol/Xgeva.  To help prevent nausea and vomiting after your treatment, we encourage you to take your nausea medication as prescribed.   If you develop nausea and vomiting that is not controlled by your nausea medication, call the clinic.   BELOW ARE SYMPTOMS THAT SHOULD BE REPORTED IMMEDIATELY:  *FEVER GREATER THAN 100.5 F  *CHILLS WITH OR WITHOUT FEVER  NAUSEA AND VOMITING THAT IS NOT CONTROLLED WITH YOUR NAUSEA MEDICATION  *UNUSUAL SHORTNESS OF BREATH  *UNUSUAL BRUISING OR BLEEDING  TENDERNESS IN MOUTH AND THROAT WITH OR WITHOUT PRESENCE OF ULCERS  *URINARY PROBLEMS  *BOWEL PROBLEMS  UNUSUAL RASH Items with * indicate a potential emergency and should be followed up as soon as possible.  Feel free to call the clinic you have any questions or concerns. The clinic phone number is [phone].

## 2014-05-27 ENCOUNTER — Other Ambulatory Visit: Payer: Self-pay

## 2014-06-04 ENCOUNTER — Other Ambulatory Visit (HOSPITAL_BASED_OUTPATIENT_CLINIC_OR_DEPARTMENT_OTHER): Payer: 59

## 2014-06-04 ENCOUNTER — Ambulatory Visit (HOSPITAL_BASED_OUTPATIENT_CLINIC_OR_DEPARTMENT_OTHER): Payer: 59

## 2014-06-04 ENCOUNTER — Ambulatory Visit: Payer: 59

## 2014-06-04 VITALS — Wt 114.0 lb

## 2014-06-04 VITALS — BP 111/44 | HR 92 | Temp 97.7°F | Resp 18

## 2014-06-04 DIAGNOSIS — C50911 Malignant neoplasm of unspecified site of right female breast: Secondary | ICD-10-CM

## 2014-06-04 DIAGNOSIS — C7952 Secondary malignant neoplasm of bone marrow: Secondary | ICD-10-CM

## 2014-06-04 DIAGNOSIS — Z5111 Encounter for antineoplastic chemotherapy: Secondary | ICD-10-CM

## 2014-06-04 DIAGNOSIS — C50119 Malignant neoplasm of central portion of unspecified female breast: Secondary | ICD-10-CM

## 2014-06-04 DIAGNOSIS — C50919 Malignant neoplasm of unspecified site of unspecified female breast: Secondary | ICD-10-CM

## 2014-06-04 DIAGNOSIS — C7951 Secondary malignant neoplasm of bone: Secondary | ICD-10-CM

## 2014-06-04 DIAGNOSIS — Z95828 Presence of other vascular implants and grafts: Secondary | ICD-10-CM

## 2014-06-04 LAB — MANUAL DIFFERENTIAL
ALC: 1.5 10*3/uL (ref 0.9–3.3)
ANC (CHCC MAN DIFF): 4.1 10*3/uL (ref 1.5–6.5)
BASOPHIL: 3 % — AB (ref 0–2)
BLASTS: 0 % (ref 0–0)
Band Neutrophils: 19 % — ABNORMAL HIGH (ref 0–10)
EOS: 3 % (ref 0–7)
LYMPH: 22 % (ref 14–49)
METAMYELOCYTES PCT: 15 % — AB (ref 0–0)
MONO: 13 % (ref 0–14)
Myelocytes: 3 % — ABNORMAL HIGH (ref 0–0)
Other Cell: 0 % (ref 0–0)
PLT EST: DECREASED
PROMYELO: 1 % — ABNORMAL HIGH (ref 0–0)
SEG: 22 % — ABNORMAL LOW (ref 38–77)
Variant Lymph: 0 % (ref 0–0)
nRBC: 13 % — ABNORMAL HIGH (ref 0–0)

## 2014-06-04 LAB — CBC WITH DIFFERENTIAL/PLATELET
HCT: 30.3 % — ABNORMAL LOW (ref 34.8–46.6)
HEMOGLOBIN: 9.4 g/dL — AB (ref 11.6–15.9)
MCH: 28.9 pg (ref 25.1–34.0)
MCHC: 31 g/dL — ABNORMAL LOW (ref 31.5–36.0)
MCV: 93.2 fL (ref 79.5–101.0)
Platelets: 31 10*3/uL — ABNORMAL LOW (ref 145–400)
RBC: 3.25 10*6/uL — ABNORMAL LOW (ref 3.70–5.45)
RDW: 18.9 % — ABNORMAL HIGH (ref 11.2–14.5)
WBC: 7 10*3/uL (ref 3.9–10.3)

## 2014-06-04 LAB — COMPREHENSIVE METABOLIC PANEL (CC13)
ALBUMIN: 3.5 g/dL (ref 3.5–5.0)
ALT: 19 U/L (ref 0–55)
AST: 43 U/L — ABNORMAL HIGH (ref 5–34)
Alkaline Phosphatase: 80 U/L (ref 40–150)
Anion Gap: 10 mEq/L (ref 3–11)
BUN: 17 mg/dL (ref 7.0–26.0)
CALCIUM: 9.7 mg/dL (ref 8.4–10.4)
CHLORIDE: 104 meq/L (ref 98–109)
CO2: 27 meq/L (ref 22–29)
Creatinine: 0.8 mg/dL (ref 0.6–1.1)
Glucose: 103 mg/dl (ref 70–140)
Potassium: 4.3 mEq/L (ref 3.5–5.1)
SODIUM: 140 meq/L (ref 136–145)
TOTAL PROTEIN: 6.8 g/dL (ref 6.4–8.3)
Total Bilirubin: 0.6 mg/dL (ref 0.20–1.20)

## 2014-06-04 MED ORDER — DEXAMETHASONE SODIUM PHOSPHATE 20 MG/5ML IJ SOLN
INTRAMUSCULAR | Status: AC
  Filled 2014-06-04: qty 5

## 2014-06-04 MED ORDER — ONDANSETRON 8 MG/NS 50 ML IVPB
INTRAVENOUS | Status: AC
  Filled 2014-06-04: qty 8

## 2014-06-04 MED ORDER — DIPHENHYDRAMINE HCL 50 MG/ML IJ SOLN
25.0000 mg | Freq: Once | INTRAMUSCULAR | Status: AC
  Administered 2014-06-04: 25 mg via INTRAVENOUS

## 2014-06-04 MED ORDER — SODIUM CHLORIDE 0.9 % IJ SOLN
10.0000 mL | INTRAMUSCULAR | Status: DC | PRN
  Administered 2014-06-04: 10 mL via INTRAVENOUS
  Filled 2014-06-04: qty 10

## 2014-06-04 MED ORDER — SODIUM CHLORIDE 0.9 % IV SOLN
Freq: Once | INTRAVENOUS | Status: AC
  Administered 2014-06-04: 10:00:00 via INTRAVENOUS

## 2014-06-04 MED ORDER — DEXAMETHASONE SODIUM PHOSPHATE 20 MG/5ML IJ SOLN
20.0000 mg | Freq: Once | INTRAMUSCULAR | Status: AC
Start: 2014-06-04 — End: 2014-06-04
  Administered 2014-06-04: 20 mg via INTRAVENOUS

## 2014-06-04 MED ORDER — HEPARIN SOD (PORK) LOCK FLUSH 100 UNIT/ML IV SOLN
500.0000 [IU] | Freq: Once | INTRAVENOUS | Status: AC | PRN
  Administered 2014-06-04: 500 [IU]
  Filled 2014-06-04: qty 5

## 2014-06-04 MED ORDER — PACLITAXEL CHEMO INJECTION 300 MG/50ML
60.0000 mg/m2 | Freq: Once | INTRAVENOUS | Status: AC
  Administered 2014-06-04: 84 mg via INTRAVENOUS
  Filled 2014-06-04: qty 14

## 2014-06-04 MED ORDER — FAMOTIDINE IN NACL 20-0.9 MG/50ML-% IV SOLN
INTRAVENOUS | Status: AC
  Filled 2014-06-04: qty 50

## 2014-06-04 MED ORDER — SODIUM CHLORIDE 0.9 % IJ SOLN
10.0000 mL | INTRAMUSCULAR | Status: DC | PRN
  Administered 2014-06-04: 10 mL
  Filled 2014-06-04: qty 10

## 2014-06-04 MED ORDER — DIPHENHYDRAMINE HCL 50 MG/ML IJ SOLN
INTRAMUSCULAR | Status: AC
  Filled 2014-06-04: qty 1

## 2014-06-04 MED ORDER — FAMOTIDINE IN NACL 20-0.9 MG/50ML-% IV SOLN
20.0000 mg | Freq: Once | INTRAVENOUS | Status: AC
  Administered 2014-06-04: 20 mg via INTRAVENOUS

## 2014-06-04 MED ORDER — ONDANSETRON 8 MG/50ML IVPB (CHCC)
8.0000 mg | Freq: Once | INTRAVENOUS | Status: AC
  Administered 2014-06-04: 8 mg via INTRAVENOUS

## 2014-06-04 NOTE — Patient Instructions (Signed)

## 2014-06-04 NOTE — Patient Instructions (Signed)
[facility] Discharge Instructions for Patients Receiving Chemotherapy  Today you received the following chemotherapy agents Taxol  To help prevent nausea and vomiting after your treatment, we encourage you to take your nausea medication    If you develop nausea and vomiting that is not controlled by your nausea medication, call the clinic.   BELOW ARE SYMPTOMS THAT SHOULD BE REPORTED IMMEDIATELY:  *FEVER GREATER THAN 100.5 F  *CHILLS WITH OR WITHOUT FEVER  NAUSEA AND VOMITING THAT IS NOT CONTROLLED WITH YOUR NAUSEA MEDICATION  *UNUSUAL SHORTNESS OF BREATH  *UNUSUAL BRUISING OR BLEEDING  TENDERNESS IN MOUTH AND THROAT WITH OR WITHOUT PRESENCE OF ULCERS  *URINARY PROBLEMS  *BOWEL PROBLEMS  UNUSUAL RASH Items with * indicate a potential emergency and should be followed up as soon as possible.  Feel free to call the clinic you have any questions or concerns. The clinic phone number is [phone].    

## 2014-06-06 ENCOUNTER — Other Ambulatory Visit: Payer: Self-pay | Admitting: Oncology

## 2014-06-09 ENCOUNTER — Ambulatory Visit (HOSPITAL_BASED_OUTPATIENT_CLINIC_OR_DEPARTMENT_OTHER): Payer: 59 | Admitting: Oncology

## 2014-06-09 ENCOUNTER — Telehealth: Payer: Self-pay | Admitting: Oncology

## 2014-06-09 ENCOUNTER — Ambulatory Visit (HOSPITAL_BASED_OUTPATIENT_CLINIC_OR_DEPARTMENT_OTHER): Payer: 59

## 2014-06-09 ENCOUNTER — Encounter: Payer: Self-pay | Admitting: Oncology

## 2014-06-09 ENCOUNTER — Other Ambulatory Visit (HOSPITAL_BASED_OUTPATIENT_CLINIC_OR_DEPARTMENT_OTHER): Payer: 59

## 2014-06-09 ENCOUNTER — Telehealth: Payer: Self-pay | Admitting: *Deleted

## 2014-06-09 VITALS — BP 131/78 | HR 104 | Temp 98.2°F | Resp 18 | Ht 63.0 in | Wt 114.4 lb

## 2014-06-09 DIAGNOSIS — C50119 Malignant neoplasm of central portion of unspecified female breast: Secondary | ICD-10-CM

## 2014-06-09 DIAGNOSIS — C7952 Secondary malignant neoplasm of bone marrow: Secondary | ICD-10-CM

## 2014-06-09 DIAGNOSIS — C50911 Malignant neoplasm of unspecified site of right female breast: Secondary | ICD-10-CM

## 2014-06-09 DIAGNOSIS — D649 Anemia, unspecified: Secondary | ICD-10-CM

## 2014-06-09 DIAGNOSIS — C7951 Secondary malignant neoplasm of bone: Secondary | ICD-10-CM

## 2014-06-09 DIAGNOSIS — Z170 Estrogen receptor positive status [ER+]: Secondary | ICD-10-CM

## 2014-06-09 DIAGNOSIS — C50919 Malignant neoplasm of unspecified site of unspecified female breast: Secondary | ICD-10-CM

## 2014-06-09 DIAGNOSIS — Z95828 Presence of other vascular implants and grafts: Secondary | ICD-10-CM

## 2014-06-09 DIAGNOSIS — D696 Thrombocytopenia, unspecified: Secondary | ICD-10-CM

## 2014-06-09 LAB — COMPREHENSIVE METABOLIC PANEL (CC13)
ALT: 17 U/L (ref 0–55)
AST: 44 U/L — AB (ref 5–34)
Albumin: 3.4 g/dL — ABNORMAL LOW (ref 3.5–5.0)
Alkaline Phosphatase: 92 U/L (ref 40–150)
Anion Gap: 10 mEq/L (ref 3–11)
BUN: 16.9 mg/dL (ref 7.0–26.0)
CO2: 26 mEq/L (ref 22–29)
CREATININE: 0.8 mg/dL (ref 0.6–1.1)
Calcium: 9.6 mg/dL (ref 8.4–10.4)
Chloride: 103 mEq/L (ref 98–109)
Glucose: 122 mg/dl (ref 70–140)
POTASSIUM: 4 meq/L (ref 3.5–5.1)
Sodium: 139 mEq/L (ref 136–145)
Total Bilirubin: 0.51 mg/dL (ref 0.20–1.20)
Total Protein: 6.8 g/dL (ref 6.4–8.3)

## 2014-06-09 LAB — CBC WITH DIFFERENTIAL/PLATELET
HCT: 29.9 % — ABNORMAL LOW (ref 34.8–46.6)
HGB: 9.4 g/dL — ABNORMAL LOW (ref 11.6–15.9)
MCH: 28.6 pg (ref 25.1–34.0)
MCHC: 31.4 g/dL — ABNORMAL LOW (ref 31.5–36.0)
MCV: 90.9 fL (ref 79.5–101.0)
Platelets: 29 10*3/uL — ABNORMAL LOW (ref 145–400)
RBC: 3.29 10*6/uL — AB (ref 3.70–5.45)
RDW: 17.5 % — AB (ref 11.2–14.5)
WBC: 5.6 10*3/uL (ref 3.9–10.3)

## 2014-06-09 LAB — MANUAL DIFFERENTIAL
ALC: 1.6 10*3/uL (ref 0.9–3.3)
ANC (CHCC manual diff): 3.1 10*3/uL (ref 1.5–6.5)
Band Neutrophils: 9 % (ref 0–10)
Basophil: 2 % (ref 0–2)
Blasts: 3 % — ABNORMAL HIGH (ref 0–0)
EOS: 5 % (ref 0–7)
LYMPH: 29 % (ref 14–49)
MONO: 5 % (ref 0–14)
Metamyelocytes: 6 % — ABNORMAL HIGH (ref 0–0)
Myelocytes: 11 % — ABNORMAL HIGH (ref 0–0)
OTHER CELL: 0 % (ref 0–0)
PLT EST: DECREASED
PROMYELO: 0 % (ref 0–0)
SEG: 30 % — AB (ref 38–77)
Variant Lymph: 0 % (ref 0–0)
nRBC: 15 % — ABNORMAL HIGH (ref 0–0)

## 2014-06-09 MED ORDER — SODIUM CHLORIDE 0.9 % IJ SOLN
10.0000 mL | INTRAMUSCULAR | Status: DC | PRN
  Administered 2014-06-09: 10 mL via INTRAVENOUS
  Filled 2014-06-09: qty 10

## 2014-06-09 MED ORDER — HEPARIN SOD (PORK) LOCK FLUSH 100 UNIT/ML IV SOLN
500.0000 [IU] | Freq: Once | INTRAVENOUS | Status: AC
  Administered 2014-06-09: 500 [IU] via INTRAVENOUS
  Filled 2014-06-09: qty 5

## 2014-06-09 NOTE — Patient Instructions (Signed)

## 2014-06-09 NOTE — Telephone Encounter (Signed)
per pof to sch pt appts-sent MW email to sch trmts-pt stated will be here 7/17 and will get updated copy of sch-pt re to come @8 :00 am lab regardless of the time for MD appt-sch

## 2014-06-09 NOTE — Progress Notes (Signed)
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 She remains thrombocytopenic related primarily to extensive marrow involvement with tumor, but has had no significant bleeding. She is tolerating this regimen well, without peripheral neuropathy. She has had intermittent gagging then dry heaves or some nausea, up to a couple of times weekly, with no real pattern that she can tell. She has not used any antiemetics (has zofran at home). She reports easy gag reflex all her life, which makes dental work especially difficult.  She denies any significant bone pain or other pain, has not needed any pain medication. She is up at home most of day, tho limited activity. Appetite is still reasonable. She denies SOB or bleeding.  She has a temporary cap on lower right tooth since just prior to metastatic breast cancer diagnosis, which is not bothersome however she wonders about leaving this as temporary. I have spoken with her dentist, Dr Arita Miss, by phone during this visit, explaining ongoing chemotherapy and thrombocytopenia; he feels there are noninvasive options that would be appropriate and would be glad to discuss with Mr and Mrs Bucaro.  She has PAC  ONCOLOGIC HISTORY #1 patient presented with a subareolar mass in her right breast in October 2007, with ultrasound-guided biopsy 08/23/2006 showing invasive mammary carcinoma with lobular features. ER +100% PR 3% proliferation marker Ki-67 12% HER-2/neu 2+ by fish. MRI breasts 08/30/2006 showed an ill-defined enhancing mass in the right breast measuring 2.6 x 4.0 x 4.1 cm. Enhancement trailed out to the nipple but did not involve the nipple. There was also  a small enhancing internal mammary node measuring 7 mm suspicious for metastatic disease. Left breast was negative.  #2 patient received neoadjuvant chemotherapy by Dr Eston Esters with [facility] regimen given dose dense for 4 cycles followed by Taxotere, from 09/20/2006 through February 2008. She had lumpectomy 02/07/2007. However there was essentially residual tumor dispersed over an area 1.8 cm with some close surgical margins at the medial area, and she underwent a mastectomy on 03/19/2007.  #3 she was seen by radiation oncology, did not receive radiation.  #4 Femara 2.5 mg daily, begun after chemotherapy and completed 5 years of therapy in June 2014.  #5. 08/05/13 for evaluation had new anemia. PET/CT chest abdomen and pelvis identified diffuse lytic lesions throughout the axial and appendicular skeleton. Bone marrow biopsy was consistent with metastatic ER/PR positive breast cancer.  #6 status post Arimidex  #7 palliative single agent Taxol begun weekly starting 11-06-13, cycle 28 on 06-04-14.  She had no marker elevation 2009-2010-2011  Review of systems as above, also: No fever or symptoms of infection. No significant GERD. No LE swelling. Slept well last pm.  Remainder of 10 point Review of Systems negative.  Objective:  Vital signs in last 24 hours:  BP 131/78  Pulse 104  Temp(Src) 98.2 F (36.8 C) (Oral)  Resp 18  Ht 5' 3"  (1.6 m)  Wt 114 lb 6.4 oz (51.891 kg)  BMI 20.27 kg/m2 weight is stable  Alert, oriented and appropriate. Ambulatory without assistance, able to get on and off exam table with minimal assistance.  Alopecia  HEENT:PERRL, sclerae not icteric. Oral mucosa moist without lesions, posterior pharynx clear. No obvious concerns around the  capped lower right tooth. Neck supple. No JVD.  Lymphatics:no cervical,suraclavicular adenopathy Resp: clear to auscultation bilaterally and normal percussion bilaterally Cardio: regular rate and rhythm. No gallop. GI: soft, nontender,  not distended, no mass or organomegaly. Normally active bowel sounds.  Musculoskeletal/ Extremities: without pitting edema, cords, tenderness. Back not tender Neuro: no peripheral neuropathy. Otherwise nonfocal Skin  Petechiae in area of last occlusive dressing around PAC. No ecchymoses, no rash. Portacath-without erythema or tenderness  Lab Results:  Results for orders placed in visit on 06/04/14  CBC WITH DIFFERENTIAL      Result Value Ref Range   WBC 7.0  3.9 - 10.3 10e3/uL   HGB 9.4 (*) 11.6 - 15.9 g/dL   HCT 30.3 (*) 34.8 - 46.6 %   Platelets 31 (*) 145 - 400 10e3/uL   MCV 93.2  79.5 - 101.0 fL   MCH 28.9  25.1 - 34.0 pg   MCHC 31.0 (*) 31.5 - 36.0 g/dL   RBC 3.25 (*) 3.70 - 5.45 10e6/uL   RDW 18.9 (*) 11.2 - 14.5 %  COMPREHENSIVE METABOLIC PANEL (TG25)      Result Value Ref Range   Sodium 140  136 - 145 mEq/L   Potassium 4.3  3.5 - 5.1 mEq/L   Chloride 104  98 - 109 mEq/L   CO2 27  22 - 29 mEq/L   Glucose 103  70 - 140 mg/dl   BUN 17.0  7.0 - 26.0 mg/dL   Creatinine 0.8  0.6 - 1.1 mg/dL   Total Bilirubin 0.60  0.20 - 1.20 mg/dL   Alkaline Phosphatase 80  40 - 150 U/L   AST 43 (*) 5 - 34 U/L   ALT 19  0 - 55 U/L   Total Protein 6.8  6.4 - 8.3 g/dL   Albumin 3.5  3.5 - 5.0 g/dL   Calcium 9.7  8.4 - 10.4 mg/dL   Anion Gap 10  3 - 11 mEq/L  MANUAL DIFFERENTIAL      Result Value Ref Range   ANC ([facility] manual diff) 4.1  1.5 - 6.5 10e3/uL   ALC 1.5  0.9 - 3.3 10e3/uL   SEG 22 (*) 38 - 77 %   Band Neutrophils 19 (*) 0 - 10 %   LYMPH 22  14 - 49 %   MONO 13  0 - 14 %   EOS 3  0 - 7 %   Basophil 3 (*) 0 - 2 %   Metamyelocytes 15 (*) 0 - 0 %   Myelocytes 3 (*) 0 - 0 %   PROMYELO 1 (*) 0 - 0 %   Blasts 0  0 - 0 %   Variant Lymph 0  0 - 0 %   Other Cell 0  0 - 0 %   nRBC 13 (*) 0 - 0 %   Polychromasia Moderate  Slight   Basophilic Stippling Few  Negative   Tear Drop Cells Moderate  Negative   Ovalocytes Few  Negative   PLT EST Decreased  Adequate    AST  stable.    Studies/Results:  No results found.  Medications: I have reviewed the patient's current medications. If she has gagging, she will try zofran once or every 12 hrs as need if bothersome gagging.  DISCUSSION: will continue present regimen as she is clinically stable and tolerating this well  Assessment/Plan:  1.metastatic lobular right breast cancer extensively involving bone: history as above, clearly improved on low dose palliative  taxol and xgeva. 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  Frequency of taxol slightly decreased from previous weekly treatments, next skip week 06-25-14.   2.Anemia: likely multifactorial with marrow replacement, ongoing chemo and multiple blood draws. Not more symptomatic now. Continue hemocyte and follow. Note elevated ferritin could be acute phase reactant  3.PAC in  4.po intake good now, on marinol  5.previous vertigo resolved with PT maneuvers during hospitalization late 2014  6. Living Will and HCPOA done and she will bring copies to be scanned into EMR. Husband is HCPOA. She does not want resuscitation or life support in irreversible situation.  7.Vitamin D deficiency summer and fall 2014:resolved, now on maintenance Vit D now.   Patient and husband have had questions answered to their satisfaction and are in agreement with plan.    Charolette Bultman P, MD   06/09/2014, 8:30 AM

## 2014-06-09 NOTE — Telephone Encounter (Signed)
Per staff message and POF I have scheduled appts. Advised scheduler of appts. JMW  

## 2014-06-10 ENCOUNTER — Other Ambulatory Visit: Payer: Self-pay | Admitting: Oncology

## 2014-06-10 DIAGNOSIS — C7951 Secondary malignant neoplasm of bone: Principal | ICD-10-CM

## 2014-06-10 DIAGNOSIS — C50919 Malignant neoplasm of unspecified site of unspecified female breast: Secondary | ICD-10-CM

## 2014-06-11 ENCOUNTER — Ambulatory Visit: Payer: 59

## 2014-06-11 ENCOUNTER — Ambulatory Visit (HOSPITAL_BASED_OUTPATIENT_CLINIC_OR_DEPARTMENT_OTHER): Payer: 59

## 2014-06-11 ENCOUNTER — Other Ambulatory Visit (HOSPITAL_BASED_OUTPATIENT_CLINIC_OR_DEPARTMENT_OTHER): Payer: 59

## 2014-06-11 VITALS — BP 144/80 | HR 102 | Temp 98.6°F | Resp 18

## 2014-06-11 DIAGNOSIS — C7951 Secondary malignant neoplasm of bone: Secondary | ICD-10-CM

## 2014-06-11 DIAGNOSIS — C50119 Malignant neoplasm of central portion of unspecified female breast: Secondary | ICD-10-CM

## 2014-06-11 DIAGNOSIS — Z5111 Encounter for antineoplastic chemotherapy: Secondary | ICD-10-CM

## 2014-06-11 DIAGNOSIS — Z95828 Presence of other vascular implants and grafts: Secondary | ICD-10-CM

## 2014-06-11 DIAGNOSIS — C50919 Malignant neoplasm of unspecified site of unspecified female breast: Secondary | ICD-10-CM

## 2014-06-11 DIAGNOSIS — C7952 Secondary malignant neoplasm of bone marrow: Secondary | ICD-10-CM

## 2014-06-11 DIAGNOSIS — D649 Anemia, unspecified: Secondary | ICD-10-CM

## 2014-06-11 LAB — CBC WITH DIFFERENTIAL/PLATELET
HCT: 30.8 % — ABNORMAL LOW (ref 34.8–46.6)
HGB: 9.7 g/dL — ABNORMAL LOW (ref 11.6–15.9)
MCH: 28.7 pg (ref 25.1–34.0)
MCHC: 31.5 g/dL (ref 31.5–36.0)
MCV: 91.1 fL (ref 79.5–101.0)
PLATELETS: 26 10*3/uL — AB (ref 145–400)
RBC: 3.38 10*6/uL — ABNORMAL LOW (ref 3.70–5.45)
RDW: 17.9 % — AB (ref 11.2–14.5)
WBC: 6.6 10*3/uL (ref 3.9–10.3)

## 2014-06-11 LAB — COMPREHENSIVE METABOLIC PANEL (CC13)
ALT: 18 U/L (ref 0–55)
AST: 46 U/L — AB (ref 5–34)
Albumin: 3.2 g/dL — ABNORMAL LOW (ref 3.5–5.0)
Alkaline Phosphatase: 93 U/L (ref 40–150)
Anion Gap: 12 mEq/L — ABNORMAL HIGH (ref 3–11)
BILIRUBIN TOTAL: 0.56 mg/dL (ref 0.20–1.20)
BUN: 18.1 mg/dL (ref 7.0–26.0)
CO2: 25 mEq/L (ref 22–29)
CREATININE: 0.8 mg/dL (ref 0.6–1.1)
Calcium: 9.5 mg/dL (ref 8.4–10.4)
Chloride: 103 mEq/L (ref 98–109)
Glucose: 149 mg/dl — ABNORMAL HIGH (ref 70–140)
Potassium: 3.8 mEq/L (ref 3.5–5.1)
Sodium: 141 mEq/L (ref 136–145)
Total Protein: 6.8 g/dL (ref 6.4–8.3)

## 2014-06-11 LAB — MANUAL DIFFERENTIAL
ALC: 1.1 10*3/uL (ref 0.9–3.3)
ANC (CHCC MAN DIFF): 3.7 10*3/uL (ref 1.5–6.5)
BASOPHIL: 6 % — AB (ref 0–2)
BLASTS: 1 % — AB (ref 0–0)
Band Neutrophils: 10 % (ref 0–10)
EOS: 5 % (ref 0–7)
LYMPH: 17 % (ref 14–49)
METAMYELOCYTES PCT: 9 % — AB (ref 0–0)
MONO: 14 % (ref 0–14)
Myelocytes: 13 % — ABNORMAL HIGH (ref 0–0)
Other Cell: 0 % (ref 0–0)
PLT EST: DECREASED
PROMYELO: 0 % (ref 0–0)
SEG: 25 % — ABNORMAL LOW (ref 38–77)
Variant Lymph: 0 % (ref 0–0)
nRBC: 25 % — ABNORMAL HIGH (ref 0–0)

## 2014-06-11 MED ORDER — DEXTROSE 5 % IV SOLN
60.0000 mg/m2 | Freq: Once | INTRAVENOUS | Status: AC
  Administered 2014-06-11: 84 mg via INTRAVENOUS
  Filled 2014-06-11: qty 14

## 2014-06-11 MED ORDER — SODIUM CHLORIDE 0.9 % IJ SOLN
10.0000 mL | INTRAMUSCULAR | Status: DC | PRN
  Administered 2014-06-11: 10 mL
  Filled 2014-06-11: qty 10

## 2014-06-11 MED ORDER — HEPARIN SOD (PORK) LOCK FLUSH 100 UNIT/ML IV SOLN
500.0000 [IU] | Freq: Once | INTRAVENOUS | Status: AC | PRN
  Administered 2014-06-11: 500 [IU]
  Filled 2014-06-11: qty 5

## 2014-06-11 MED ORDER — SODIUM CHLORIDE 0.9 % IJ SOLN
10.0000 mL | INTRAMUSCULAR | Status: DC | PRN
  Administered 2014-06-11: 10 mL via INTRAVENOUS
  Filled 2014-06-11: qty 10

## 2014-06-11 MED ORDER — ONDANSETRON 8 MG/NS 50 ML IVPB
INTRAVENOUS | Status: AC
  Filled 2014-06-11: qty 8

## 2014-06-11 MED ORDER — ONDANSETRON 8 MG/50ML IVPB (CHCC)
8.0000 mg | Freq: Once | INTRAVENOUS | Status: AC
  Administered 2014-06-11: 8 mg via INTRAVENOUS

## 2014-06-11 MED ORDER — DEXAMETHASONE SODIUM PHOSPHATE 20 MG/5ML IJ SOLN
INTRAMUSCULAR | Status: AC
  Filled 2014-06-11: qty 5

## 2014-06-11 MED ORDER — FAMOTIDINE IN NACL 20-0.9 MG/50ML-% IV SOLN
20.0000 mg | Freq: Once | INTRAVENOUS | Status: AC
  Administered 2014-06-11: 20 mg via INTRAVENOUS

## 2014-06-11 MED ORDER — DIPHENHYDRAMINE HCL 50 MG/ML IJ SOLN
INTRAMUSCULAR | Status: AC
  Filled 2014-06-11: qty 1

## 2014-06-11 MED ORDER — DEXAMETHASONE SODIUM PHOSPHATE 20 MG/5ML IJ SOLN
20.0000 mg | Freq: Once | INTRAMUSCULAR | Status: AC
  Administered 2014-06-11: 20 mg via INTRAVENOUS

## 2014-06-11 MED ORDER — DIPHENHYDRAMINE HCL 50 MG/ML IJ SOLN
25.0000 mg | Freq: Once | INTRAMUSCULAR | Status: AC
  Administered 2014-06-11: 25 mg via INTRAVENOUS

## 2014-06-11 MED ORDER — FAMOTIDINE IN NACL 20-0.9 MG/50ML-% IV SOLN
INTRAVENOUS | Status: AC
  Filled 2014-06-11: qty 50

## 2014-06-11 MED ORDER — SODIUM CHLORIDE 0.9 % IV SOLN
Freq: Once | INTRAVENOUS | Status: AC
  Administered 2014-06-11: 09:00:00 via INTRAVENOUS

## 2014-06-11 NOTE — Progress Notes (Signed)
Per 06-09-14 office note by Dr. Marko Plume, okay to tx with platelets > or += 20K.

## 2014-06-11 NOTE — Patient Instructions (Signed)
[facility] Discharge Instructions for Patients Receiving Chemotherapy  Today you received the following chemotherapy agents: Taxol  To help prevent nausea and vomiting after your treatment, we encourage you to take your nausea medication as prescribed by your physician.  If you develop nausea and vomiting that is not controlled by your nausea medication, call the clinic.   BELOW ARE SYMPTOMS THAT SHOULD BE REPORTED IMMEDIATELY:  *FEVER GREATER THAN 100.5 F  *CHILLS WITH OR WITHOUT FEVER  NAUSEA AND VOMITING THAT IS NOT CONTROLLED WITH YOUR NAUSEA MEDICATION  *UNUSUAL SHORTNESS OF BREATH  *UNUSUAL BRUISING OR BLEEDING  TENDERNESS IN MOUTH AND THROAT WITH OR WITHOUT PRESENCE OF ULCERS  *URINARY PROBLEMS  *BOWEL PROBLEMS  UNUSUAL RASH Items with * indicate a potential emergency and should be followed up as soon as possible.  Feel free to call the clinic you have any questions or concerns. The clinic phone number is [phone].    

## 2014-06-11 NOTE — Patient Instructions (Signed)

## 2014-06-13 NOTE — Progress Notes (Signed)
[name] OFF[facility] PROGRESS NOTE  Patient Care Team: [name], MD as PCP - General (Family Medicine) Dr. 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 Diagnosed in October 2007 as T1N1   SUMMARY OF ONCOLOGIC HISTORY: #1 patient presented with a mass in her right breast in October 2007. She underwent bilateral mammograms. The right breast ultrasound performed on 08/19/2006 showed increased density and distortion in the subareolar location of the right breast. Ultrasound showed abnormal echoes in the subareolar location which was a change measuring 1.5 cm. Patient underwent an ultrasound-guided biopsy on 08/23/2006 which showed invasive mammary carcinoma with lobular features. ER +100% PR 3% proliferation marker Ki-67 12% HER-2/neu 2+ by fish. MRI of the breasts on 08/30/2006 showed an ill-defined enhancing mass in the right breast measuring 2.6 x 4.0 x 4.1 cm. Enhancement trailed out to the nipple but did not involve the nipple. There was also a small enhancing internal mammary node measuring 7 mm suspicious for metastatic disease. Left breast was negative.  #2 patient was seen by Dr. Eston Esters on 09/09/2006. She received neoadjuvant chemotherapy initially consisting of FEC regimen given dose dense for 4 cycles followed by Taxotere. She received her chemotherapy she received her chemotherapy from 09/20/2006 through February 2008. She then went on to lumpectomy on 02/07/2007. However there was essentially residual tumor dispersed over an area 1.8 cm with some close surgical margins at the medial area. Her case was reviewed at the breast conference and it was suggested she undergo mastectomy. Patient underwent a mastectomy on 03/19/2007.  #3 she was subsequently seen by radiation oncology for consideration of post mastectomy radiation therapy. However, she was not given  radiation.  #4 she was then begun on Femara 2.5 mg daily. She has now completed 5 years of therapy. It was discontinued in June 2014.  #5 Returned to clinic on 08/05/13 for evaluation and management of new anemia. She has underwent PET/CT chest abdomen and pelvis which reveals diffuse lytic lesions throughout the axial and appendicular skeleton. Bone marrow biopsy was consistent with metastatic ER/PR positive breast cancer.  #6 status post Arimidex  #7 palliative single agent Taxol being given weekly until patient has progression of disease  CURRENT THERAPY:dose attenuated weekly taxol  INTERVAL HISTORY: Gina [name] 68 y.o. female returns for followup visit for her weekly taxol. Clinically she looks terrific. She continues to eat well. She continues to gain weight. She denies having any nausea or vomiting abdominal pain. She has no musculoskeletal pain reported today. She has no peripheral paresthesias. She denies any myalgias and arthralgias. She has no bleeding. She denies any epistaxis bleeding from her gowns no cough hemoptysis hematemesis hematuria hematochezia or easy bruising. [name] is negative and as below.  I have reviewed the past medical history, past surgical history, social history and family history with the patient and they are unchanged from previous note.  ALLERGIES:  is allergic to sudafed and tramadol.  MEDICATIONS:  Current Outpatient Prescriptions  Medication Sig Dispense Refill  . cetirizine (ZYRTEC) 10 MG tablet Take 10 mg by mouth daily.        . Cholecalciferol (VITAMIN D3) 5000 UNITS TABS Take 5,000 mg by mouth daily.      Marland Kitchen HYDROcodone-acetaminophen (NORCO/VICODIN) 5-325 MG per tablet Take 1 tablet by mouth every 6 (six) hours as needed for moderate pain.  30 tablet  0  . lidocaine-prilocaine (EMLA) cream Apply 1 application topically as needed.  30 g  1  . [place] Mushroom POWD 5 mLs by Does not apply route daily.      .  meclizine (ANTIVERT) 25 MG tablet Take 1 tablet (25 mg total) by mouth 3 (three) times daily as needed for dizziness.  30 tablet  0  . mirtazapine (REMERON) 15 MG tablet       . Multiple Vitamins-Minerals (OSTEO COMPLEX PO) Take by mouth.      Marland Kitchen omeprazole (PRILOSEC) 40 MG capsule Take 40 mg by mouth daily.      . ondansetron (ZOFRAN) 4 MG tablet Take 1 tablet (4 mg total) by mouth every 6 (six) hours as needed for nausea.  20 tablet  0  . venlafaxine XR (EFFEXOR-XR) 37.5 MG 24 hr capsule Take 1 capsule (37.5 mg total) by mouth daily with breakfast.  30 capsule  9  . dronabinol (MARINOL) 2.5 MG capsule Take 1 capsule (2.5 mg total) by mouth 2 (two) times daily before a meal.  60 capsule  0  . HEMOCYTE 324 MG TABS tablet Take 1 tablet (106 mg of iron total) by mouth daily.  30 each  3   No current facility-administered medications for this visit.   Facility-Administered Medications Ordered in Other Visits  Medication Dose Route Frequency Provider Last Rate Last Dose  . sodium chloride 0.9 % injection 10 mL  10 mL Intravenous PRN Amy G Berry, PA-C   10 mL at 03/05/14 1344  . sodium chloride 0.9 % injection 10 mL  10 mL Intravenous PRN Deatra Robinson, MD   10 mL at 03/18/14 0943  . sodium chloride 0.9 % injection 10 mL  10 mL Intravenous PRN Lennis Marion Downer, MD   10 mL at 03/25/14 1117    REVIEW OF SYSTEMS:   Constitutional: Denies fevers, chills or abnormal weight loss Eyes: Denies blurriness of vision Ears, nose, mouth, throat, and face: Denies mucositis or sore throat Respiratory: Denies cough, dyspnea or wheezes Cardiovascular: Denies palpitation, chest discomfort or lower extremity swelling Gastrointestinal:  Denies nausea, heartburn or change in bowel habits Skin: Denies abnormal skin rashes Lymphatics: Denies new lymphadenopathy or easy bruising Neurological:Denies numbness, tingling or new weaknesses Behavioral/Psych: Mood is stable, no new changes  All other systems were reviewed  with the patient and are negative.  PHYSICAL EXAMINATION: ECOG PERFORMANCE STATUS: 1 - Symptomatic but completely ambulatory  Filed Vitals:   03/12/14 0831  BP: 125/75  Pulse: 112  Temp: 98.4 F (36.9 C)  Resp: 18   Filed Weights   03/12/14 0831  Weight: 111 lb 11.2 oz (50.667 kg)    GENERAL:alert, no distress and comfortable SKIN: skin color, texture, turgor are normal, no rashes or significant lesions EYES: normal, Conjunctiva are pink and non-injected, sclera clear OROPHARYNX:no exudate, no erythema and lips, buccal mucosa, and tongue normal  NECK: supple, thyroid normal size, non-tender, without nodularity LYMPH:  no palpable lymphadenopathy in the cervical, axillary or inguinal LUNGS: clear to auscultation and percussion with normal breathing effort HEART: regular rate & rhythm and no murmurs and no lower extremity edema ABDOMEN:abdomen soft, non-tender and normal bowel sounds Musculoskeletal:no cyanosis of digits and no clubbing  NEURO: alert & oriented x 3 with fluent speech, no focal motor/sensory deficits Port site: Looks clean without any evidence of infections.  LABORATORY DATA:  I have reviewed the data    RADIOGRAPHIC STUDIES: I have personally reviewed the radiological images as listed  and agreed with the findings in the report. No results found.    ASSESSMENT & PLAN:  68 year old female with  #1 stage IV metastatic invasive lobular carcinoma. She was originally diagnosed in 2007. She is currently receiving palliative chemotherapy consisting of weekly Taxol which is dose attenuated. She continues to tolerate this very well. She has minimal side effects from it. Patient will proceed with cycle 14 of Taxol. I reviewed her labs with her today.Patient will eventually need restaging CT scans. I have discussed this with them. We will plan on doing these sometime in April 2015.  #2 anemia:  She has not had any bleeding problems she is asymptomatic from this. We  will continue to observe.  #3 thrombocytopenia:  No bleeding or bruising. This is adequate enough to proceed with treatment.   #4 chronic pain: Patient is without any pain. She does have prescriptions for hydrocodone and tramadol. She has not been taking these because she has been asymptomatic  #5 bone metastasis: Patient receives [place]. She understands the risks benefits and side effects as well as complications of this drug.  #6 followup: 1 week for next taxol as long as her platelets are adequate.  All questions were answered. The patient knows to call the clinic with any problems, questions or concerns. No barriers to learning was detected.      Marcy Panning, MD

## 2014-06-18 ENCOUNTER — Ambulatory Visit (HOSPITAL_BASED_OUTPATIENT_CLINIC_OR_DEPARTMENT_OTHER): Payer: 59

## 2014-06-18 ENCOUNTER — Ambulatory Visit: Payer: 59

## 2014-06-18 ENCOUNTER — Other Ambulatory Visit (HOSPITAL_BASED_OUTPATIENT_CLINIC_OR_DEPARTMENT_OTHER): Payer: 59

## 2014-06-18 VITALS — BP 136/47 | HR 104 | Temp 97.6°F | Resp 19

## 2014-06-18 DIAGNOSIS — C7951 Secondary malignant neoplasm of bone: Secondary | ICD-10-CM

## 2014-06-18 DIAGNOSIS — C50911 Malignant neoplasm of unspecified site of right female breast: Secondary | ICD-10-CM

## 2014-06-18 DIAGNOSIS — C7952 Secondary malignant neoplasm of bone marrow: Secondary | ICD-10-CM

## 2014-06-18 DIAGNOSIS — C50919 Malignant neoplasm of unspecified site of unspecified female breast: Secondary | ICD-10-CM

## 2014-06-18 DIAGNOSIS — Z5111 Encounter for antineoplastic chemotherapy: Secondary | ICD-10-CM

## 2014-06-18 DIAGNOSIS — C50119 Malignant neoplasm of central portion of unspecified female breast: Secondary | ICD-10-CM

## 2014-06-18 DIAGNOSIS — Z95828 Presence of other vascular implants and grafts: Secondary | ICD-10-CM

## 2014-06-18 LAB — CBC WITH DIFFERENTIAL/PLATELET
HCT: 28.5 % — ABNORMAL LOW (ref 34.8–46.6)
HEMOGLOBIN: 8.7 g/dL — AB (ref 11.6–15.9)
MCH: 28.2 pg (ref 25.1–34.0)
MCHC: 30.5 g/dL — ABNORMAL LOW (ref 31.5–36.0)
MCV: 92.5 fL (ref 79.5–101.0)
Platelets: 27 10*3/uL — ABNORMAL LOW (ref 145–400)
RBC: 3.08 10*6/uL — ABNORMAL LOW (ref 3.70–5.45)
RDW: 17.9 % — ABNORMAL HIGH (ref 11.2–14.5)
WBC: 5.3 10*3/uL (ref 3.9–10.3)

## 2014-06-18 LAB — MANUAL DIFFERENTIAL
ALC: 1.4 10*3/uL (ref 0.9–3.3)
ANC (CHCC manual diff): 3 10*3/uL (ref 1.5–6.5)
BAND NEUTROPHILS: 13 % — AB (ref 0–10)
BASOPHIL: 2 % (ref 0–2)
Blasts: 2 % — ABNORMAL HIGH (ref 0–0)
EOS%: 1 % (ref 0–7)
LYMPH: 27 % (ref 14–49)
MONO: 7 % (ref 0–14)
Metamyelocytes: 5 % — ABNORMAL HIGH (ref 0–0)
Myelocytes: 18 % — ABNORMAL HIGH (ref 0–0)
NRBC: 29 % — AB (ref 0–0)
Other Cell: 0 % (ref 0–0)
PLT EST: DECREASED
PROMYELO: 3 % — AB (ref 0–0)
SEG: 22 % — ABNORMAL LOW (ref 38–77)
Variant Lymph: 0 % (ref 0–0)

## 2014-06-18 LAB — COMPREHENSIVE METABOLIC PANEL (CC13)
ALBUMIN: 3.2 g/dL — AB (ref 3.5–5.0)
ALT: 21 U/L (ref 0–55)
ANION GAP: 12 meq/L — AB (ref 3–11)
AST: 44 U/L — ABNORMAL HIGH (ref 5–34)
Alkaline Phosphatase: 90 U/L (ref 40–150)
BILIRUBIN TOTAL: 0.52 mg/dL (ref 0.20–1.20)
BUN: 16.3 mg/dL (ref 7.0–26.0)
CHLORIDE: 104 meq/L (ref 98–109)
CO2: 25 meq/L (ref 22–29)
Calcium: 9.4 mg/dL (ref 8.4–10.4)
Creatinine: 0.8 mg/dL (ref 0.6–1.1)
Glucose: 179 mg/dl — ABNORMAL HIGH (ref 70–140)
Potassium: 3.7 mEq/L (ref 3.5–5.1)
SODIUM: 141 meq/L (ref 136–145)
Total Protein: 6.4 g/dL (ref 6.4–8.3)

## 2014-06-18 MED ORDER — PACLITAXEL CHEMO INJECTION 300 MG/50ML
60.0000 mg/m2 | Freq: Once | INTRAVENOUS | Status: AC
  Administered 2014-06-18: 84 mg via INTRAVENOUS
  Filled 2014-06-18: qty 14

## 2014-06-18 MED ORDER — DENOSUMAB 120 MG/1.7ML ~~LOC~~ SOLN
120.0000 mg | Freq: Once | SUBCUTANEOUS | Status: AC
  Administered 2014-06-18: 120 mg via SUBCUTANEOUS
  Filled 2014-06-18: qty 1.7

## 2014-06-18 MED ORDER — DEXAMETHASONE SODIUM PHOSPHATE 20 MG/5ML IJ SOLN
INTRAMUSCULAR | Status: AC
  Filled 2014-06-18: qty 5

## 2014-06-18 MED ORDER — HEPARIN SOD (PORK) LOCK FLUSH 100 UNIT/ML IV SOLN
500.0000 [IU] | Freq: Once | INTRAVENOUS | Status: AC | PRN
  Administered 2014-06-18: 500 [IU]
  Filled 2014-06-18: qty 5

## 2014-06-18 MED ORDER — DEXAMETHASONE SODIUM PHOSPHATE 20 MG/5ML IJ SOLN
20.0000 mg | Freq: Once | INTRAMUSCULAR | Status: AC
  Administered 2014-06-18: 20 mg via INTRAVENOUS

## 2014-06-18 MED ORDER — SODIUM CHLORIDE 0.9 % IV SOLN
Freq: Once | INTRAVENOUS | Status: AC
  Administered 2014-06-18: 09:00:00 via INTRAVENOUS

## 2014-06-18 MED ORDER — FAMOTIDINE IN NACL 20-0.9 MG/50ML-% IV SOLN
INTRAVENOUS | Status: AC
  Filled 2014-06-18: qty 50

## 2014-06-18 MED ORDER — ONDANSETRON 8 MG/NS 50 ML IVPB
INTRAVENOUS | Status: AC
  Filled 2014-06-18: qty 8

## 2014-06-18 MED ORDER — FAMOTIDINE IN NACL 20-0.9 MG/50ML-% IV SOLN
20.0000 mg | Freq: Once | INTRAVENOUS | Status: AC
  Administered 2014-06-18: 20 mg via INTRAVENOUS

## 2014-06-18 MED ORDER — DIPHENHYDRAMINE HCL 50 MG/ML IJ SOLN
25.0000 mg | Freq: Once | INTRAMUSCULAR | Status: AC
  Administered 2014-06-18: 25 mg via INTRAVENOUS

## 2014-06-18 MED ORDER — DIPHENHYDRAMINE HCL 50 MG/ML IJ SOLN
INTRAMUSCULAR | Status: AC
  Filled 2014-06-18: qty 1

## 2014-06-18 MED ORDER — ONDANSETRON 8 MG/50ML IVPB (CHCC)
8.0000 mg | Freq: Once | INTRAVENOUS | Status: AC
  Administered 2014-06-18: 8 mg via INTRAVENOUS

## 2014-06-18 MED ORDER — SODIUM CHLORIDE 0.9 % IJ SOLN
10.0000 mL | INTRAMUSCULAR | Status: DC | PRN
  Administered 2014-06-18: 10 mL via INTRAVENOUS
  Filled 2014-06-18: qty 10

## 2014-06-18 MED ORDER — SODIUM CHLORIDE 0.9 % IJ SOLN
10.0000 mL | INTRAMUSCULAR | Status: DC | PRN
  Administered 2014-06-18: 10 mL
  Filled 2014-06-18: qty 10

## 2014-06-18 NOTE — Patient Instructions (Signed)

## 2014-06-18 NOTE — Progress Notes (Signed)
Per office note, okay to tx if platelets > or = 20K.

## 2014-06-18 NOTE — Patient Instructions (Signed)
[facility] Discharge Instructions for Patients Receiving Chemotherapy  Today you received the following chemotherapy agents: Taxol  To help prevent nausea and vomiting after your treatment, we encourage you to take your nausea medication as prescribed by your physician.    If you develop nausea and vomiting that is not controlled by your nausea medication, call the clinic.   BELOW ARE SYMPTOMS THAT SHOULD BE REPORTED IMMEDIATELY:  *FEVER GREATER THAN 100.5 F  *CHILLS WITH OR WITHOUT FEVER  NAUSEA AND VOMITING THAT IS NOT CONTROLLED WITH YOUR NAUSEA MEDICATION  *UNUSUAL SHORTNESS OF BREATH  *UNUSUAL BRUISING OR BLEEDING  TENDERNESS IN MOUTH AND THROAT WITH OR WITHOUT PRESENCE OF ULCERS  *URINARY PROBLEMS  *BOWEL PROBLEMS  UNUSUAL RASH Items with * indicate a potential emergency and should be followed up as soon as possible.  Feel free to call the clinic you have any questions or concerns. The clinic phone number is [phone].  Denosumab injection (XGEVA) What is this medicine? DENOSUMAB (den oh sue mab) slows bone breakdown. Prolia is used to treat osteoporosis in women after menopause and in men. Delton See is used to prevent bone fractures and other bone problems caused by cancer bone metastases. Delton See is also used to treat giant cell tumor of the bone. This medicine may be used for other purposes; ask your health care provider or pharmacist if you have questions. COMMON BRAND NAME(S): Prolia, XGEVA What should I tell my health care provider before I take this medicine? They need to know if you have any of these conditions: -dental disease -eczema -infection or history of infections -kidney disease or on dialysis -low blood calcium or vitamin D -malabsorption syndrome -scheduled to have surgery or tooth extraction -taking medicine that contains denosumab -thyroid or parathyroid disease -an unusual reaction to denosumab, other medicines, foods, dyes, or  preservatives -pregnant or trying to get pregnant -breast-feeding How should I use this medicine? This medicine is for injection under the skin. It is given by a health care professional in a hospital or clinic setting. If you are getting Prolia, a special MedGuide will be given to you by the pharmacist with each prescription and refill. Be sure to read this information carefully each time. For Prolia, talk to your pediatrician regarding the use of this medicine in children. Special care may be needed. For Delton See, talk to your pediatrician regarding the use of this medicine in children. While this drug may be prescribed for children as young as 13 years for selected conditions, precautions do apply. Overdosage: If you think you've taken too much of this medicine contact a poison control center or emergency room at once. Overdosage: If you think you have taken too much of this medicine contact a poison control center or emergency room at once. NOTE: This medicine is only for you. Do not share this medicine with others. What if I miss a dose? It is important not to miss your dose. Call your doctor or health care professional if you are unable to keep an appointment. What may interact with this medicine? Do not take this medicine with any of the following medications: -other medicines containing denosumab This medicine may also interact with the following medications: -medicines that suppress the immune system -medicines that treat cancer -steroid medicines like prednisone or cortisone This list may not describe all possible interactions. Give your health care provider a list of all the medicines, herbs, non-prescription drugs, or dietary supplements you use. Also tell them if you smoke, drink  alcohol, or use illegal drugs. Some items may interact with your medicine. What should I watch for while using this medicine? Visit your doctor or health care professional for regular checks on your progress.  Your doctor or health care professional may order blood tests and other tests to see how you are doing. Call your doctor or health care professional if you get a cold or other infection while receiving this medicine. Do not treat yourself. This medicine may decrease your body's ability to fight infection. You should make sure you get enough calcium and vitamin D while you are taking this medicine, unless your doctor tells you not to. Discuss the foods you eat and the vitamins you take with your health care professional. See your dentist regularly. Brush and floss your teeth as directed. Before you have any dental work done, tell your dentist you are receiving this medicine. Do not become pregnant while taking this medicine or for 5 months after stopping it. Women should inform their doctor if they wish to become pregnant or think they might be pregnant. There is a potential for serious side effects to an unborn child. Talk to your health care professional or pharmacist for more information. What side effects may I notice from receiving this medicine? Side effects that you should report to your doctor or health care professional as soon as possible: -allergic reactions like skin rash, itching or hives, swelling of the face, lips, or tongue -breathing problems -chest pain -fast, irregular heartbeat -feeling faint or lightheaded, falls -fever, chills, or any other sign of infection -muscle spasms, tightening, or twitches -numbness or tingling -skin blisters or bumps, or is dry, peels, or red -slow healing or unexplained pain in the mouth or jaw -unusual bleeding or bruising Side effects that usually do not require medical attention (Report these to your doctor or health care professional if they continue or are bothersome.): -muscle pain -stomach upset, gas This list may not describe all possible side effects. Call your doctor for medical advice about side effects. You may report side effects to  FDA at 1-800-FDA-1088. Where should I keep my medicine? This medicine is only given in a clinic, doctor's office, or other health care setting and will not be stored at home. NOTE: This sheet is a summary. It may not cover all possible information. If you have questions about this medicine, talk to your doctor, pharmacist, or health care provider.  2015, [employer] Standard. (2012-05-12 12:37:47)

## 2014-06-25 ENCOUNTER — Telehealth: Payer: Self-pay

## 2014-06-25 DIAGNOSIS — C7951 Secondary malignant neoplasm of bone: Principal | ICD-10-CM

## 2014-06-25 DIAGNOSIS — C50919 Malignant neoplasm of unspecified site of unspecified female breast: Secondary | ICD-10-CM

## 2014-06-25 MED ORDER — DRONABINOL 2.5 MG PO CAPS: 2.5000 mg | ORAL_CAPSULE | Freq: Two times a day (BID) | ORAL | Status: DC

## 2014-06-25 NOTE — Telephone Encounter (Signed)
Told Ms. Rieger that the prescription for the Marinol is ready for pick up by 4pm today at the [facility].  Patient verbalized understanding.

## 2014-06-28 ENCOUNTER — Other Ambulatory Visit: Payer: Self-pay | Admitting: Oncology

## 2014-07-02 ENCOUNTER — Ambulatory Visit (HOSPITAL_BASED_OUTPATIENT_CLINIC_OR_DEPARTMENT_OTHER): Payer: 59

## 2014-07-02 ENCOUNTER — Telehealth: Payer: Self-pay | Admitting: *Deleted

## 2014-07-02 ENCOUNTER — Encounter: Payer: Self-pay | Admitting: Oncology

## 2014-07-02 ENCOUNTER — Telehealth: Payer: Self-pay | Admitting: Oncology

## 2014-07-02 ENCOUNTER — Ambulatory Visit (HOSPITAL_BASED_OUTPATIENT_CLINIC_OR_DEPARTMENT_OTHER): Payer: 59 | Admitting: Oncology

## 2014-07-02 VITALS — BP 126/89 | HR 119 | Temp 98.2°F | Resp 18 | Ht 63.0 in | Wt 114.0 lb

## 2014-07-02 DIAGNOSIS — E559 Vitamin D deficiency, unspecified: Secondary | ICD-10-CM

## 2014-07-02 DIAGNOSIS — C7952 Secondary malignant neoplasm of bone marrow: Secondary | ICD-10-CM

## 2014-07-02 DIAGNOSIS — C50119 Malignant neoplasm of central portion of unspecified female breast: Secondary | ICD-10-CM

## 2014-07-02 DIAGNOSIS — C7951 Secondary malignant neoplasm of bone: Secondary | ICD-10-CM

## 2014-07-02 DIAGNOSIS — C50919 Malignant neoplasm of unspecified site of unspecified female breast: Secondary | ICD-10-CM

## 2014-07-02 DIAGNOSIS — Z5111 Encounter for antineoplastic chemotherapy: Secondary | ICD-10-CM

## 2014-07-02 DIAGNOSIS — D649 Anemia, unspecified: Secondary | ICD-10-CM

## 2014-07-02 DIAGNOSIS — Z95828 Presence of other vascular implants and grafts: Secondary | ICD-10-CM

## 2014-07-02 LAB — MANUAL DIFFERENTIAL
ALC: 1.8 10*3/uL (ref 0.9–3.3)
ANC (CHCC MAN DIFF): 5.2 10*3/uL (ref 1.5–6.5)
BLASTS: 2 % — AB (ref 0–0)
Band Neutrophils: 12 % — ABNORMAL HIGH (ref 0–10)
Basophil: 6 % — ABNORMAL HIGH (ref 0–2)
EOS: 1 % (ref 0–7)
LYMPH: 21 % (ref 14–49)
METAMYELOCYTES PCT: 8 % — AB (ref 0–0)
MONO: 11 % (ref 0–14)
Myelocytes: 23 % — ABNORMAL HIGH (ref 0–0)
Other Cell: 0 % (ref 0–0)
PLT EST: DECREASED
PROMYELO: 0 % (ref 0–0)
SEG: 16 % — ABNORMAL LOW (ref 38–77)
Variant Lymph: 0 % (ref 0–0)
nRBC: 16 % — ABNORMAL HIGH (ref 0–0)

## 2014-07-02 LAB — COMPREHENSIVE METABOLIC PANEL (CC13)
ALBUMIN: 3.4 g/dL — AB (ref 3.5–5.0)
ALK PHOS: 106 U/L (ref 40–150)
ALT: 22 U/L (ref 0–55)
AST: 47 U/L — ABNORMAL HIGH (ref 5–34)
Anion Gap: 12 mEq/L — ABNORMAL HIGH (ref 3–11)
BUN: 16 mg/dL (ref 7.0–26.0)
CO2: 25 mEq/L (ref 22–29)
Calcium: 9.8 mg/dL (ref 8.4–10.4)
Chloride: 105 mEq/L (ref 98–109)
Creatinine: 0.8 mg/dL (ref 0.6–1.1)
GLUCOSE: 122 mg/dL (ref 70–140)
POTASSIUM: 3.7 meq/L (ref 3.5–5.1)
SODIUM: 142 meq/L (ref 136–145)
TOTAL PROTEIN: 6.6 g/dL (ref 6.4–8.3)
Total Bilirubin: 0.71 mg/dL (ref 0.20–1.20)

## 2014-07-02 LAB — CBC WITH DIFFERENTIAL/PLATELET
HEMATOCRIT: 31.5 % — AB (ref 34.8–46.6)
HEMOGLOBIN: 9.7 g/dL — AB (ref 11.6–15.9)
MCH: 29 pg (ref 25.1–34.0)
MCHC: 30.8 g/dL — AB (ref 31.5–36.0)
MCV: 94.3 fL (ref 79.5–101.0)
Platelets: 41 10*3/uL — ABNORMAL LOW (ref 145–400)
RBC: 3.34 10*6/uL — ABNORMAL LOW (ref 3.70–5.45)
RDW: 19.2 % — ABNORMAL HIGH (ref 11.2–14.5)
WBC: 8.8 10*3/uL (ref 3.9–10.3)

## 2014-07-02 MED ORDER — DIPHENHYDRAMINE HCL 50 MG/ML IJ SOLN
INTRAMUSCULAR | Status: AC
  Filled 2014-07-02: qty 1

## 2014-07-02 MED ORDER — SODIUM CHLORIDE 0.9 % IJ SOLN
10.0000 mL | INTRAMUSCULAR | Status: DC | PRN
  Administered 2014-07-02: 10 mL
  Filled 2014-07-02: qty 10

## 2014-07-02 MED ORDER — FAMOTIDINE IN NACL 20-0.9 MG/50ML-% IV SOLN
20.0000 mg | Freq: Once | INTRAVENOUS | Status: AC
  Administered 2014-07-02: 20 mg via INTRAVENOUS

## 2014-07-02 MED ORDER — DRONABINOL 2.5 MG PO CAPS: 2.5000 mg | ORAL_CAPSULE | Freq: Two times a day (BID) | ORAL | Status: DC

## 2014-07-02 MED ORDER — DEXAMETHASONE SODIUM PHOSPHATE 20 MG/5ML IJ SOLN
20.0000 mg | Freq: Once | INTRAMUSCULAR | Status: AC
  Administered 2014-07-02: 20 mg via INTRAVENOUS

## 2014-07-02 MED ORDER — DIPHENHYDRAMINE HCL 50 MG/ML IJ SOLN
25.0000 mg | Freq: Once | INTRAMUSCULAR | Status: AC
  Administered 2014-07-02: 25 mg via INTRAVENOUS

## 2014-07-02 MED ORDER — SODIUM CHLORIDE 0.9 % IV SOLN
Freq: Once | INTRAVENOUS | Status: AC
  Administered 2014-07-02: 10:00:00 via INTRAVENOUS

## 2014-07-02 MED ORDER — SODIUM CHLORIDE 0.9 % IJ SOLN
10.0000 mL | INTRAMUSCULAR | Status: DC | PRN
  Administered 2014-07-02: 10 mL via INTRAVENOUS
  Filled 2014-07-02: qty 10

## 2014-07-02 MED ORDER — DEXAMETHASONE SODIUM PHOSPHATE 20 MG/5ML IJ SOLN
INTRAMUSCULAR | Status: AC
  Filled 2014-07-02: qty 5

## 2014-07-02 MED ORDER — FAMOTIDINE IN NACL 20-0.9 MG/50ML-% IV SOLN
INTRAVENOUS | Status: AC
Start: 2014-07-02 — End: 2014-07-02
  Filled 2014-07-02: qty 50

## 2014-07-02 MED ORDER — ONDANSETRON 8 MG/50ML IVPB (CHCC)
8.0000 mg | Freq: Once | INTRAVENOUS | Status: AC
  Administered 2014-07-02: 8 mg via INTRAVENOUS

## 2014-07-02 MED ORDER — ONDANSETRON 8 MG/NS 50 ML IVPB
INTRAVENOUS | Status: AC
  Filled 2014-07-02: qty 8

## 2014-07-02 MED ORDER — LORAZEPAM 0.5 MG PO TABS: ORAL_TABLET | ORAL | Status: DC

## 2014-07-02 MED ORDER — PACLITAXEL CHEMO INJECTION 300 MG/50ML
60.0000 mg/m2 | Freq: Once | INTRAVENOUS | Status: AC
  Administered 2014-07-02: 84 mg via INTRAVENOUS
  Filled 2014-07-02: qty 14

## 2014-07-02 MED ORDER — HEPARIN SOD (PORK) LOCK FLUSH 100 UNIT/ML IV SOLN
500.0000 [IU] | Freq: Once | INTRAVENOUS | Status: AC | PRN
  Administered 2014-07-02: 500 [IU]
  Filled 2014-07-02: qty 5

## 2014-07-02 NOTE — Patient Instructions (Signed)
Stop the high dose Vit D,/cholecalciferol, which is 5000 units daily  Take calcium tablet with D twice daily. The calcium is usually 600 mg per tablet with 400 - 800 units of D in each tablet.  Change zofran (ondansetron) to two of the 4 mg tablets every 8 hrs as needed for nausea. Take two tablets if dry heaves start. You can also use 1-2 tablets before coming to [place] if smells here trigger nausea. This does not make you drowsy.  We will send in script for ativan (lorazepam) 0.5 mg, which you can use every 6 hrs as needed for dry heaves or nausea. These tablets are tiny, so often can swallow even if nausea, or they dissolve under tongue and get in as fast as IV if you are not able to swallow due to nausea/ dry heaves. This medicine will make you drowsy.

## 2014-07-02 NOTE — Patient Instructions (Signed)

## 2014-07-02 NOTE — Telephone Encounter (Signed)
Per [facility] staff phone call scheduled appts. Advised schedulers 

## 2014-07-02 NOTE — Progress Notes (Signed)
OFFICE PROGRESS NOTE   07/02/2014   Physicians:Kalsoom Humphrey Rolls), Antony Contras (PCP), H.Mezer, Pauline Good   INTERVAL HISTORY:   Patient is seen, together with husband, in continuing attention to treatment underway for metastatic lobular carcinoma extensively involving bone, now on taxol days 12-03-13 q 28 days and q 4 week xgeva, all of which she is tolerating well. She remains thrombocytopenic, but has not had any significant bleeding. She had 2 episodes of dry heaves and vomiting last week (break week from chemo), improved with the first doses of oral zofran that she has used thus far. Husband noticed that she was unsteady walking during the second episode of dry heaves, similar to vertigo symptoms last winter; the unsteadiness resolved promptly. Again today she had dry heaves when she smelled alcohol in lab prior to MD visit. She has had no pain and has not needed any pain medication in weeks. Appetite otherwise is good, bowels moving regularly, and she is tolerating gentle activity at home.  Last scans were CT CAP 02-2014 and PET 07-2013. She has PAC.    ONCOLOGIC HISTORY #1 patient presented with a subareolar mass in her right breast in October 2007, with ultrasound-guided biopsy 08/23/2006 showing invasive mammary carcinoma with lobular features. ER +100% PR 3% proliferation marker Ki-67 12% HER-2/neu 2+ by fish. MRI breasts 08/30/2006 showed an ill-defined enhancing mass in the right breast measuring 2.6 x 4.0 x 4.1 cm. Enhancement trailed out to the nipple but did not involve the nipple. There was also a small enhancing internal mammary node measuring 7 mm suspicious for metastatic disease. Left breast was negative.  #2 patient received neoadjuvant chemotherapy by Dr Eston Esters with [facility] regimen given dose dense for 4 cycles followed by Taxotere, from 09/20/2006 through February 2008. She had lumpectomy 02/07/2007. However there was essentially residual tumor dispersed over an area 1.8 cm with  some close surgical margins at the medial area, and she underwent a mastectomy on 03/19/2007.  #3 she was seen by radiation oncology, did not receive radiation.  #4 Femara 2.5 mg daily, begun after chemotherapy and completed 5 years of therapy in June 2014.  #5. 08/05/13 for evaluation had new anemia. PET/CT chest abdomen and pelvis identified diffuse lytic lesions throughout the axial and appendicular skeleton. Bone marrow biopsy was consistent with metastatic ER/PR positive breast cancer.  #6 status post Arimidex  #7 palliative single agent Taxol begun weekly starting 11-06-13, cycle 28 on 06-04-14.  She had no marker elevation 2009-2010-2011  Review of systems as above, also: No SOB or cough. No GERD. No LE swelling. No fever or symptoms of infection. She had filling done by dentist without problems. Remainder of 10 point Review of Systems negative.  Objective:  Vital signs in last 24 hours:  BP 126/89  Pulse 119  Temp(Src) 98.2 F (36.8 C) (Oral)  Resp 18  Ht 5' 3"  (1.6 m)  Wt 114 lb (51.71 kg)  BMI 20.20 kg/m2 Weight is stable Alert, oriented and appropriate. Ambulatory without difficulty. Looks comfortable. Alopecia  HEENT:PERRL, sclerae not icteric. Oral mucosa moist without lesions, posterior pharynx clear.  Neck supple. No JVD.  Lymphatics:no cervical,suraclavicular, axillary adenopathy Resp: clear to auscultation bilaterally and normal percussion bilaterally Cardio: regular rate and rhythm. No gallop. GI: soft, nontender, not distended, no mass or organomegaly. Normally active bowel sounds.  Musculoskeletal/ Extremities: without pitting edema, cords, tenderness Neuro: Speech fluent and appropriate, CN intact, no peripheral neuropathy. Otherwise nonfocal Skin petechiae under occlusive dressing at [facility], otherwise without rash, ecchymosis,  petechiae Breasts: right lumpectomy Portacath-without erythema or tenderness.  Lab Results:  Results for orders placed in visit on  07/02/14  CBC WITH DIFFERENTIAL      Result Value Ref Range   WBC 8.8  3.9 - 10.3 10e3/uL   HGB 9.7 (*) 11.6 - 15.9 g/dL   HCT 31.5 (*) 34.8 - 46.6 %   Platelets 41 (*) 145 - 400 10e3/uL   MCV 94.3  79.5 - 101.0 fL   MCH 29.0  25.1 - 34.0 pg   MCHC 30.8 (*) 31.5 - 36.0 g/dL   RBC 3.34 (*) 3.70 - 5.45 10e6/uL   RDW 19.2 (*) 11.2 - 14.5 %  COMPREHENSIVE METABOLIC PANEL (HD62)      Result Value Ref Range   Sodium 142  136 - 145 mEq/L   Potassium 3.7  3.5 - 5.1 mEq/L   Chloride 105  98 - 109 mEq/L   CO2 25  22 - 29 mEq/L   Glucose 122  70 - 140 mg/dl   BUN 16.0  7.0 - 26.0 mg/dL   Creatinine 0.8  0.6 - 1.1 mg/dL   Total Bilirubin 0.71  0.20 - 1.20 mg/dL   Alkaline Phosphatase 106  40 - 150 U/L   AST 47 (*) 5 - 34 U/L   ALT 22  0 - 55 U/L   Total Protein 6.6  6.4 - 8.3 g/dL   Albumin 3.4 (*) 3.5 - 5.0 g/dL   Calcium 9.8  8.4 - 10.4 mg/dL   Anion Gap 12 (*) 3 - 11 mEq/L  MANUAL DIFFERENTIAL      Result Value Ref Range   ANC ([facility] manual diff) 5.2  1.5 - 6.5 10e3/uL   ALC 1.8  0.9 - 3.3 10e3/uL   SEG 16 (*) 38 - 77 %   Band Neutrophils 12 (*) 0 - 10 %   LYMPH 21  14 - 49 %   MONO 11  0 - 14 %   EOS 1  0 - 7 %   Basophil 6 (*) 0 - 2 %   Metamyelocytes 8 (*) 0 - 0 %   Myelocytes 23 (*) 0 - 0 %   PROMYELO 0  0 - 0 %   Blasts 2 (*) 0 - 0 %   Variant Lymph 0  0 - 0 %   Other Cell 0  0 - 0 %   nRBC 16 (*) 0 - 0 %   Polychromasia Moderate  Slight   Basophilic Stippling Few  Negative   Tear Drop Cells Moderate  Negative   Ovalocytes Moderate  Negative   PLT EST Decreased  Adequate   platelets today are the best that she has had.  Note Vitamin D levelMay 2015 was 78  Studies/Results:  No results found.  Medications: I have reviewed the patient's current medications. Apparently she is still on increased dose Vitamin D, which I have asked them to stop, to use calcium with D bid instead. I have asked her to take zofran at increased dose of 8 mg q 8 hrs prn nausea, using a dose  as soon as she can keep it down if dry heaves resume. I have also given script for ativan 0.5 mg to use SL or po q 6 hr prn nausea or dry heaves, explaining that SL may be best if unable to keep down po's.  DISCUSSION: medications and labs as above.  Assessment/Plan: 1.metastatic lobular right breast cancer extensively involving bone: history as above,  clearly improved on low dose palliative taxol and xgeva. OK to treat as long as platelets are >20k as thrombocytopenia is primarily related to marrow replacement by tumor. She will have day 1 today, day 8 with APP visit on 8-14, and day 15 on 8-21 also with xgeva. I will see her in early Sept. 2.Anemia: likely multifactorial with marrow replacement, ongoing chemo and multiple blood draws. Some better. Continue hemocyte and follow.  3.PAC in  4.po intake good now, on marinol  5.previous vertigo resolved with PT maneuvers during hospitalization late 2014. They will let me know if unsteadiness recurs, as she is certainly at risk for brain mets or intracranial bleeding. 6. Living Will and HCPOA done and she will bring copies to be scanned into EMR. Husband is HCPOA. She does not want resuscitation or life support in irreversible situation.  7.Vitamin D deficiency summer and fall 2014:resolved, just maintenance Vit D appropriate now. 8. 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 With low platelets best to use antiemetics promptly to keep these controlled as best possible.   They prefer visits and rx on Fridays as possible, tho they will see provider on other days if necessary. Chemo and xgeva orders confirmed. CBC and CMET weekly with treatments. Time spent 25 min including >50% counseling and coordination of care.    Bond Grieshop P, MD   07/02/2014, 10:03 AM

## 2014-07-02 NOTE — Telephone Encounter (Signed)
, °

## 2014-07-02 NOTE — Patient Instructions (Signed)
[facility] Discharge Instructions for Patients Receiving Chemotherapy  Today you received the following chemotherapy agents: taxol  To help prevent nausea and vomiting after your treatment, we encourage you to take your nausea medication.  Take it as often as prescribed.     If you develop nausea and vomiting that is not controlled by your nausea medication, call the clinic. If it is after clinic hours your family physician or the after hours number for the clinic or go to the Emergency Department.   BELOW ARE SYMPTOMS THAT SHOULD BE REPORTED IMMEDIATELY:  *FEVER GREATER THAN 100.5 F  *CHILLS WITH OR WITHOUT FEVER  NAUSEA AND VOMITING THAT IS NOT CONTROLLED WITH YOUR NAUSEA MEDICATION  *UNUSUAL SHORTNESS OF BREATH  *UNUSUAL BRUISING OR BLEEDING  TENDERNESS IN MOUTH AND THROAT WITH OR WITHOUT PRESENCE OF ULCERS  *URINARY PROBLEMS  *BOWEL PROBLEMS  UNUSUAL RASH Items with * indicate a potential emergency and should be followed up as soon as possible.  Feel free to call the clinic you have any questions or concerns. The clinic phone number is [phone].   I have been informed and understand all the instructions given to me. I know to contact the clinic, my physician, or go to the Emergency Department if any problems should occur. I do not have any questions at this time, but understand that I may call the clinic during office hours   should I have any questions or need assistance in obtaining follow up care.    __________________________________________  _____________  __________ Signature of Patient or Authorized Representative            Date                   Time    __________________________________________ Nurse's Signature    

## 2014-07-09 ENCOUNTER — Other Ambulatory Visit (HOSPITAL_BASED_OUTPATIENT_CLINIC_OR_DEPARTMENT_OTHER): Payer: 59

## 2014-07-09 ENCOUNTER — Ambulatory Visit: Payer: 59

## 2014-07-09 ENCOUNTER — Other Ambulatory Visit: Payer: Self-pay

## 2014-07-09 ENCOUNTER — Ambulatory Visit (HOSPITAL_BASED_OUTPATIENT_CLINIC_OR_DEPARTMENT_OTHER): Payer: 59

## 2014-07-09 VITALS — BP 122/71 | HR 88 | Temp 96.9°F

## 2014-07-09 DIAGNOSIS — C7952 Secondary malignant neoplasm of bone marrow: Secondary | ICD-10-CM

## 2014-07-09 DIAGNOSIS — C7951 Secondary malignant neoplasm of bone: Secondary | ICD-10-CM

## 2014-07-09 DIAGNOSIS — C50919 Malignant neoplasm of unspecified site of unspecified female breast: Secondary | ICD-10-CM

## 2014-07-09 DIAGNOSIS — C50119 Malignant neoplasm of central portion of unspecified female breast: Secondary | ICD-10-CM

## 2014-07-09 DIAGNOSIS — Z95828 Presence of other vascular implants and grafts: Secondary | ICD-10-CM

## 2014-07-09 DIAGNOSIS — Z5111 Encounter for antineoplastic chemotherapy: Secondary | ICD-10-CM

## 2014-07-09 LAB — MANUAL DIFFERENTIAL
ALC: 1.5 10*3/uL (ref 0.9–3.3)
ANC (CHCC MAN DIFF): 3.9 10*3/uL (ref 1.5–6.5)
BASOPHIL: 3 % — AB (ref 0–2)
Band Neutrophils: 20 % — ABNORMAL HIGH (ref 0–10)
Blasts: 2 % — ABNORMAL HIGH (ref 0–0)
EOS: 4 % (ref 0–7)
LYMPH: 22 % (ref 14–49)
METAMYELOCYTES PCT: 12 % — AB (ref 0–0)
MONO: 11 % (ref 0–14)
Myelocytes: 4 % — ABNORMAL HIGH (ref 0–0)
Other Cell: 0 % (ref 0–0)
PLT EST: DECREASED
PROMYELO: 1 % — AB (ref 0–0)
SEG: 21 % — ABNORMAL LOW (ref 38–77)
Variant Lymph: 0 % (ref 0–0)
nRBC: 20 % — ABNORMAL HIGH (ref 0–0)

## 2014-07-09 LAB — COMPREHENSIVE METABOLIC PANEL (CC13)
ALBUMIN: 3.5 g/dL (ref 3.5–5.0)
ALT: 24 U/L (ref 0–55)
ANION GAP: 11 meq/L (ref 3–11)
AST: 50 U/L — ABNORMAL HIGH (ref 5–34)
Alkaline Phosphatase: 95 U/L (ref 40–150)
BUN: 17.9 mg/dL (ref 7.0–26.0)
CO2: 26 meq/L (ref 22–29)
Calcium: 10.1 mg/dL (ref 8.4–10.4)
Chloride: 103 mEq/L (ref 98–109)
Creatinine: 0.9 mg/dL (ref 0.6–1.1)
GLUCOSE: 145 mg/dL — AB (ref 70–140)
POTASSIUM: 4.1 meq/L (ref 3.5–5.1)
Sodium: 141 mEq/L (ref 136–145)
TOTAL PROTEIN: 6.6 g/dL (ref 6.4–8.3)
Total Bilirubin: 0.44 mg/dL (ref 0.20–1.20)

## 2014-07-09 LAB — CBC WITH DIFFERENTIAL/PLATELET
HEMATOCRIT: 30.4 % — AB (ref 34.8–46.6)
HEMOGLOBIN: 9.4 g/dL — AB (ref 11.6–15.9)
MCH: 28.7 pg (ref 25.1–34.0)
MCHC: 30.9 g/dL — ABNORMAL LOW (ref 31.5–36.0)
MCV: 92.7 fL (ref 79.5–101.0)
Platelets: 30 10*3/uL — ABNORMAL LOW (ref 145–400)
RBC: 3.28 10*6/uL — ABNORMAL LOW (ref 3.70–5.45)
RDW: 18.2 % — ABNORMAL HIGH (ref 11.2–14.5)
WBC: 6.8 10*3/uL (ref 3.9–10.3)

## 2014-07-09 MED ORDER — DEXTROSE 5 % IV SOLN
60.0000 mg/m2 | Freq: Once | INTRAVENOUS | Status: AC
  Administered 2014-07-09: 84 mg via INTRAVENOUS
  Filled 2014-07-09: qty 14

## 2014-07-09 MED ORDER — DIPHENHYDRAMINE HCL 50 MG/ML IJ SOLN
25.0000 mg | Freq: Once | INTRAMUSCULAR | Status: AC
  Administered 2014-07-09: 25 mg via INTRAVENOUS

## 2014-07-09 MED ORDER — ONDANSETRON 8 MG/NS 50 ML IVPB
INTRAVENOUS | Status: AC
  Filled 2014-07-09: qty 8

## 2014-07-09 MED ORDER — HEPARIN SOD (PORK) LOCK FLUSH 100 UNIT/ML IV SOLN
500.0000 [IU] | Freq: Once | INTRAVENOUS | Status: AC | PRN
  Administered 2014-07-09: 500 [IU]
  Filled 2014-07-09: qty 5

## 2014-07-09 MED ORDER — SODIUM CHLORIDE 0.9 % IV SOLN
Freq: Once | INTRAVENOUS | Status: AC
  Administered 2014-07-09: 09:00:00 via INTRAVENOUS

## 2014-07-09 MED ORDER — DEXAMETHASONE SODIUM PHOSPHATE 20 MG/5ML IJ SOLN
20.0000 mg | Freq: Once | INTRAMUSCULAR | Status: AC
  Administered 2014-07-09: 20 mg via INTRAVENOUS

## 2014-07-09 MED ORDER — ONDANSETRON 8 MG/50ML IVPB (CHCC)
8.0000 mg | Freq: Once | INTRAVENOUS | Status: AC
  Administered 2014-07-09: 8 mg via INTRAVENOUS

## 2014-07-09 MED ORDER — ONDANSETRON HCL 4 MG PO TABS: 8.0000 mg | ORAL_TABLET | Freq: Three times a day (TID) | ORAL | Status: DC | PRN

## 2014-07-09 MED ORDER — DIPHENHYDRAMINE HCL 50 MG/ML IJ SOLN
INTRAMUSCULAR | Status: AC
  Filled 2014-07-09: qty 1

## 2014-07-09 MED ORDER — SODIUM CHLORIDE 0.9 % IJ SOLN
10.0000 mL | INTRAMUSCULAR | Status: DC | PRN
  Administered 2014-07-09: 10 mL
  Filled 2014-07-09: qty 10

## 2014-07-09 MED ORDER — SODIUM CHLORIDE 0.9 % IJ SOLN
10.0000 mL | INTRAMUSCULAR | Status: DC | PRN
  Administered 2014-07-09: 10 mL via INTRAVENOUS
  Filled 2014-07-09: qty 10

## 2014-07-09 MED ORDER — FAMOTIDINE IN NACL 20-0.9 MG/50ML-% IV SOLN
INTRAVENOUS | Status: AC
  Filled 2014-07-09: qty 50

## 2014-07-09 MED ORDER — DEXAMETHASONE SODIUM PHOSPHATE 20 MG/5ML IJ SOLN
INTRAMUSCULAR | Status: AC
Start: 2014-07-09 — End: 2014-07-09
  Filled 2014-07-09: qty 5

## 2014-07-09 MED ORDER — FAMOTIDINE IN NACL 20-0.9 MG/50ML-% IV SOLN
20.0000 mg | Freq: Once | INTRAVENOUS | Status: AC
  Administered 2014-07-09: 20 mg via INTRAVENOUS

## 2014-07-09 NOTE — Patient Instructions (Signed)

## 2014-07-09 NOTE — Patient Instructions (Signed)
[facility] Discharge Instructions for Patients Receiving Chemotherapy  Today you received the following chemotherapy agents: Taxol.  To help prevent nausea and vomiting after your treatment, we encourage you to take your nausea medication as prescribed.   If you develop nausea and vomiting that is not controlled by your nausea medication, call the clinic.   BELOW ARE SYMPTOMS THAT SHOULD BE REPORTED IMMEDIATELY:  *FEVER GREATER THAN 100.5 F  *CHILLS WITH OR WITHOUT FEVER  NAUSEA AND VOMITING THAT IS NOT CONTROLLED WITH YOUR NAUSEA MEDICATION  *UNUSUAL SHORTNESS OF BREATH  *UNUSUAL BRUISING OR BLEEDING  TENDERNESS IN MOUTH AND THROAT WITH OR WITHOUT PRESENCE OF ULCERS  *URINARY PROBLEMS  *BOWEL PROBLEMS  UNUSUAL RASH Items with * indicate a potential emergency and should be followed up as soon as possible.  Feel free to call the clinic you have any questions or concerns. The clinic phone number is [phone].    

## 2014-07-12 ENCOUNTER — Other Ambulatory Visit: Payer: Self-pay

## 2014-07-12 DIAGNOSIS — R42 Dizziness and giddiness: Secondary | ICD-10-CM

## 2014-07-12 MED ORDER — MECLIZINE HCL 25 MG PO TABS: 25.0000 mg | ORAL_TABLET | Freq: Three times a day (TID) | ORAL | Status: DC | PRN

## 2014-07-12 NOTE — Progress Notes (Signed)
Sent referral to neuro rehab for Gina [name] to be evaluated for vertigo and called in prescription for Antivert  to [facility]. Information relayed to Friend of family Ilsa Iha [phone].  Appointment for physical therapy evaluation is WED 07-14-14.

## 2014-07-14 ENCOUNTER — Ambulatory Visit: Payer: 59 | Attending: Oncology | Admitting: Physical Therapy

## 2014-07-14 DIAGNOSIS — IMO0001 Reserved for inherently not codable concepts without codable children: Secondary | ICD-10-CM | POA: Insufficient documentation

## 2014-07-14 DIAGNOSIS — H8110 Benign paroxysmal vertigo, unspecified ear: Secondary | ICD-10-CM | POA: Diagnosis not present

## 2014-07-14 DIAGNOSIS — R269 Unspecified abnormalities of gait and mobility: Secondary | ICD-10-CM | POA: Diagnosis not present

## 2014-07-16 ENCOUNTER — Ambulatory Visit (HOSPITAL_BASED_OUTPATIENT_CLINIC_OR_DEPARTMENT_OTHER): Payer: 59 | Admitting: Oncology

## 2014-07-16 ENCOUNTER — Ambulatory Visit (HOSPITAL_BASED_OUTPATIENT_CLINIC_OR_DEPARTMENT_OTHER): Payer: 59

## 2014-07-16 ENCOUNTER — Ambulatory Visit: Payer: 59 | Admitting: Adult Health

## 2014-07-16 ENCOUNTER — Encounter: Payer: Self-pay | Admitting: Oncology

## 2014-07-16 ENCOUNTER — Ambulatory Visit: Payer: 59

## 2014-07-16 ENCOUNTER — Ambulatory Visit: Payer: 59 | Admitting: Physical Therapy

## 2014-07-16 VITALS — Wt 112.4 lb

## 2014-07-16 VITALS — BP 131/75 | HR 89 | Temp 97.7°F | Resp 18 | Ht 63.0 in | Wt 113.4 lb

## 2014-07-16 DIAGNOSIS — IMO0001 Reserved for inherently not codable concepts without codable children: Secondary | ICD-10-CM | POA: Diagnosis not present

## 2014-07-16 DIAGNOSIS — Z452 Encounter for adjustment and management of vascular access device: Secondary | ICD-10-CM

## 2014-07-16 DIAGNOSIS — C50911 Malignant neoplasm of unspecified site of right female breast: Secondary | ICD-10-CM

## 2014-07-16 DIAGNOSIS — C50119 Malignant neoplasm of central portion of unspecified female breast: Secondary | ICD-10-CM

## 2014-07-16 DIAGNOSIS — Z5111 Encounter for antineoplastic chemotherapy: Secondary | ICD-10-CM

## 2014-07-16 DIAGNOSIS — D649 Anemia, unspecified: Secondary | ICD-10-CM

## 2014-07-16 DIAGNOSIS — C7951 Secondary malignant neoplasm of bone: Secondary | ICD-10-CM

## 2014-07-16 DIAGNOSIS — Z170 Estrogen receptor positive status [ER+]: Secondary | ICD-10-CM

## 2014-07-16 DIAGNOSIS — Z95828 Presence of other vascular implants and grafts: Secondary | ICD-10-CM

## 2014-07-16 DIAGNOSIS — C50919 Malignant neoplasm of unspecified site of unspecified female breast: Secondary | ICD-10-CM

## 2014-07-16 DIAGNOSIS — C7952 Secondary malignant neoplasm of bone marrow: Secondary | ICD-10-CM

## 2014-07-16 DIAGNOSIS — D696 Thrombocytopenia, unspecified: Secondary | ICD-10-CM

## 2014-07-16 LAB — COMPREHENSIVE METABOLIC PANEL (CC13)
ALT: 22 U/L (ref 0–55)
AST: 54 U/L — AB (ref 5–34)
Albumin: 3.4 g/dL — ABNORMAL LOW (ref 3.5–5.0)
Alkaline Phosphatase: 94 U/L (ref 40–150)
Anion Gap: 11 mEq/L (ref 3–11)
BUN: 15.1 mg/dL (ref 7.0–26.0)
CALCIUM: 9.5 mg/dL (ref 8.4–10.4)
CHLORIDE: 103 meq/L (ref 98–109)
CO2: 26 mEq/L (ref 22–29)
Creatinine: 0.9 mg/dL (ref 0.6–1.1)
Glucose: 132 mg/dl (ref 70–140)
Potassium: 3.9 mEq/L (ref 3.5–5.1)
Sodium: 140 mEq/L (ref 136–145)
Total Bilirubin: 0.49 mg/dL (ref 0.20–1.20)
Total Protein: 6.5 g/dL (ref 6.4–8.3)

## 2014-07-16 LAB — MANUAL DIFFERENTIAL
ALC: 1.7 10*3/uL (ref 0.9–3.3)
ANC (CHCC manual diff): 1.9 10*3/uL (ref 1.5–6.5)
BAND NEUTROPHILS: 2 % (ref 0–10)
Basophil: 3 % — ABNORMAL HIGH (ref 0–2)
Blasts: 3 % — ABNORMAL HIGH (ref 0–0)
EOS%: 6 % (ref 0–7)
LYMPH: 30 % (ref 14–49)
MONO: 25 % — AB (ref 0–14)
MYELOCYTES: 5 % — AB (ref 0–0)
Metamyelocytes: 2 % — ABNORMAL HIGH (ref 0–0)
OTHER CELL: 0 % (ref 0–0)
PLT EST: DECREASED
PROMYELO: 1 % — ABNORMAL HIGH (ref 0–0)
SEG: 23 % — ABNORMAL LOW (ref 38–77)
Variant Lymph: 0 % (ref 0–0)
nRBC: 26 % — ABNORMAL HIGH (ref 0–0)

## 2014-07-16 LAB — CBC WITH DIFFERENTIAL/PLATELET
HCT: 29.7 % — ABNORMAL LOW (ref 34.8–46.6)
HGB: 9.4 g/dL — ABNORMAL LOW (ref 11.6–15.9)
MCH: 29.1 pg (ref 25.1–34.0)
MCHC: 31.6 g/dL (ref 31.5–36.0)
MCV: 92 fL (ref 79.5–101.0)
Platelets: 23 10*3/uL — ABNORMAL LOW (ref 145–400)
RBC: 3.23 10*6/uL — AB (ref 3.70–5.45)
RDW: 17.8 % — AB (ref 11.2–14.5)
WBC: 5.8 10*3/uL (ref 3.9–10.3)

## 2014-07-16 MED ORDER — SODIUM CHLORIDE 0.9 % IJ SOLN
10.0000 mL | INTRAMUSCULAR | Status: DC | PRN
  Administered 2014-07-16: 10 mL
  Filled 2014-07-16: qty 10

## 2014-07-16 MED ORDER — FAMOTIDINE IN NACL 20-0.9 MG/50ML-% IV SOLN
20.0000 mg | Freq: Once | INTRAVENOUS | Status: AC
  Administered 2014-07-16: 20 mg via INTRAVENOUS

## 2014-07-16 MED ORDER — DIPHENHYDRAMINE HCL 50 MG/ML IJ SOLN
INTRAMUSCULAR | Status: AC
  Filled 2014-07-16: qty 1

## 2014-07-16 MED ORDER — DEXAMETHASONE SODIUM PHOSPHATE 20 MG/5ML IJ SOLN
20.0000 mg | Freq: Once | INTRAMUSCULAR | Status: AC
  Administered 2014-07-16: 20 mg via INTRAVENOUS

## 2014-07-16 MED ORDER — DEXAMETHASONE SODIUM PHOSPHATE 20 MG/5ML IJ SOLN
INTRAMUSCULAR | Status: AC
  Filled 2014-07-16: qty 5

## 2014-07-16 MED ORDER — ONDANSETRON 8 MG/NS 50 ML IVPB
INTRAVENOUS | Status: AC
  Filled 2014-07-16: qty 8

## 2014-07-16 MED ORDER — SODIUM CHLORIDE 0.9 % IV SOLN
Freq: Once | INTRAVENOUS | Status: AC
  Administered 2014-07-16: 10:00:00 via INTRAVENOUS

## 2014-07-16 MED ORDER — FAMOTIDINE IN NACL 20-0.9 MG/50ML-% IV SOLN
INTRAVENOUS | Status: AC
  Filled 2014-07-16: qty 50

## 2014-07-16 MED ORDER — SODIUM CHLORIDE 0.9 % IJ SOLN
10.0000 mL | INTRAMUSCULAR | Status: DC | PRN
  Administered 2014-07-16: 10 mL via INTRAVENOUS
  Filled 2014-07-16: qty 10

## 2014-07-16 MED ORDER — PACLITAXEL CHEMO INJECTION 300 MG/50ML
60.0000 mg/m2 | Freq: Once | INTRAVENOUS | Status: AC
  Administered 2014-07-16: 84 mg via INTRAVENOUS
  Filled 2014-07-16: qty 14

## 2014-07-16 MED ORDER — ONDANSETRON 8 MG/50ML IVPB (CHCC)
8.0000 mg | Freq: Once | INTRAVENOUS | Status: AC
  Administered 2014-07-16: 8 mg via INTRAVENOUS

## 2014-07-16 MED ORDER — DIPHENHYDRAMINE HCL 50 MG/ML IJ SOLN
25.0000 mg | Freq: Once | INTRAMUSCULAR | Status: AC
  Administered 2014-07-16: 25 mg via INTRAVENOUS

## 2014-07-16 MED ORDER — HEPARIN SOD (PORK) LOCK FLUSH 100 UNIT/ML IV SOLN
500.0000 [IU] | Freq: Once | INTRAVENOUS | Status: AC | PRN
  Administered 2014-07-16: 500 [IU]
  Filled 2014-07-16: qty 5

## 2014-07-16 MED ORDER — DENOSUMAB 120 MG/1.7ML ~~LOC~~ SOLN
120.0000 mg | Freq: Once | SUBCUTANEOUS | Status: AC
  Administered 2014-07-16: 120 mg via SUBCUTANEOUS
  Filled 2014-07-16: qty 1.7

## 2014-07-16 NOTE — Progress Notes (Signed)
OFFICE PROGRESS NOTE   07/16/2014   Physicians:Kalsoom Humphrey Rolls), Antony Contras (PCP), H.Mezer, Pauline Good   INTERVAL HISTORY:   Patient is seen, together with husband, in continuing attention to treatment underway for metastatic lobular carcinoma extensively involving bone, now on taxol days 12-03-13 q 28 days and q 4 week xgeva, all of which she is tolerating well. She remains thrombocytopenic, but has not had any significant bleeding. She has intermittent dry heaves due to vertigo. She is seeing PT for her vertigo. Had her first treatment, but was unable to complete due to nausea and vomiting. Due for treatment today and plans to pre-medicate with antiemetics. She has had no pain and has not needed any pain medication in weeks. Appetite otherwise is good, bowels moving regularly, and she is tolerating gentle activity at home.  Last scans were CT CAP 02-2014 and PET 07-2013. She has PAC.    ONCOLOGIC HISTORY #1 patient presented with a subareolar mass in her right breast in October 2007, with ultrasound-guided biopsy 08/23/2006 showing invasive mammary carcinoma with lobular features. ER +100% PR 3% proliferation marker Ki-67 12% HER-2/neu 2+ by fish. MRI breasts 08/30/2006 showed an ill-defined enhancing mass in the right breast measuring 2.6 x 4.0 x 4.1 cm. Enhancement trailed out to the nipple but did not involve the nipple. There was also a small enhancing internal mammary node measuring 7 mm suspicious for metastatic disease. Left breast was negative.  #2 patient received neoadjuvant chemotherapy by Dr Eston Esters with [facility] regimen given dose dense for 4 cycles followed by Taxotere, from 09/20/2006 through February 2008. She had lumpectomy 02/07/2007. However there was essentially residual tumor dispersed over an area 1.8 cm with some close surgical margins at the medial area, and she underwent a mastectomy on 03/19/2007.  #3 she was seen by radiation oncology, did not receive radiation.  #4  Femara 2.5 mg daily, begun after chemotherapy and completed 5 years of therapy in June 2014.  #5. 08/05/13 for evaluation had new anemia. PET/CT chest abdomen and pelvis identified diffuse lytic lesions throughout the axial and appendicular skeleton. Bone marrow biopsy was consistent with metastatic ER/PR positive breast cancer.  #6 status post Arimidex  #7 palliative single agent Taxol begun weekly starting 11-06-13, cycle 28 on 06-04-14.  She had no marker elevation 2009-2010-2011  Review of systems as above, also: No SOB or cough. No GERD. No LE swelling. No fever or symptoms of infection.  Remainder of 10 point Review of Systems negative.  Objective:  Vital signs in last 24 hours:  BP 131/75  Pulse 89  Temp(Src) 97.7 F (36.5 C) (Oral)  Resp 18  Ht 5' 3"  (1.6 m)  Wt 113 lb 6.4 oz (51.438 kg)  BMI 20.09 kg/m2  SpO2 100% Weight is stable Alert, oriented and appropriate. Ambulatory without difficulty. Looks comfortable. Alopecia  HEENT:PERRL, sclerae not icteric. Oral mucosa moist without lesions, posterior pharynx clear.  Neck supple. No JVD.  Lymphatics:no cervical,suraclavicular, axillary adenopathy Resp: clear to auscultation bilaterally and normal percussion bilaterally Cardio: regular rate and rhythm. No gallop. GI: soft, nontender, not distended, no mass or organomegaly. Normally active bowel sounds.  Musculoskeletal/ Extremities: without pitting edema, cords, tenderness Neuro: Speech fluent and appropriate, CN intact, no peripheral neuropathy. Otherwise nonfocal Skin petechiae under occlusive dressing at [facility], otherwise without rash, ecchymosis, petechiae Breasts: right lumpectomy Portacath-without erythema or tenderness.  Lab Results:  Results for orders placed in visit on 07/16/14  CBC WITH DIFFERENTIAL      Result Value Ref  Range   WBC 5.8  3.9 - 10.3 10e3/uL   HGB 9.4 (*) 11.6 - 15.9 g/dL   HCT 29.7 (*) 34.8 - 46.6 %   Platelets 23 (*) 145 - 400 10e3/uL   MCV  92.0  79.5 - 101.0 fL   MCH 29.1  25.1 - 34.0 pg   MCHC 31.6  31.5 - 36.0 g/dL   RBC 3.23 (*) 3.70 - 5.45 10e6/uL   RDW 17.8 (*) 11.2 - 14.5 %  COMPREHENSIVE METABOLIC PANEL (XT06)      Result Value Ref Range   Sodium 140  136 - 145 mEq/L   Potassium 3.9  3.5 - 5.1 mEq/L   Chloride 103  98 - 109 mEq/L   CO2 26  22 - 29 mEq/L   Glucose 132  70 - 140 mg/dl   BUN 15.1  7.0 - 26.0 mg/dL   Creatinine 0.9  0.6 - 1.1 mg/dL   Total Bilirubin 0.49  0.20 - 1.20 mg/dL   Alkaline Phosphatase 94  40 - 150 U/L   AST 54 (*) 5 - 34 U/L   ALT 22  0 - 55 U/L   Total Protein 6.5  6.4 - 8.3 g/dL   Albumin 3.4 (*) 3.5 - 5.0 g/dL   Calcium 9.5  8.4 - 10.4 mg/dL   Anion Gap 11  3 - 11 mEq/L  MANUAL DIFFERENTIAL      Result Value Ref Range   ANC ([facility] manual diff) 1.9  1.5 - 6.5 10e3/uL   ALC 1.7  0.9 - 3.3 10e3/uL   SEG 23 (*) 38 - 77 %   Band Neutrophils 2  0 - 10 %   LYMPH 30  14 - 49 %   MONO 25 (*) 0 - 14 %   EOS 6  0 - 7 %   Basophil 3 (*) 0 - 2 %   Metamyelocytes 2 (*) 0 - 0 %   Myelocytes 5 (*) 0 - 0 %   PROMYELO 1 (*) 0 - 0 %   Blasts 3 (*) 0 - 0 %   Variant Lymph 0  0 - 0 %   Other Cell 0  0 - 0 %   nRBC 26 (*) 0 - 0 %   Polychromasia Moderate  Slight   Basophilic Stippling Few  Negative   Tear Drop Cells Few  Negative   Ovalocytes Few  Negative   PLT EST Decreased  Adequate   platelets today are the best that she has had.  Note Vitamin D levelMay 2015 was 78  Studies/Results:  No results found.  Medications: I have reviewed the patient's current medications. Apparently she is still on increased dose Vitamin D, which I have asked them to stop, to use calcium with D bid instead. I have asked her to take zofran at increased dose of 8 mg q 8 hrs prn nausea, using a dose as soon as she can keep it down if dry heaves resume. I have also given script for ativan 0.5 mg to use SL or po q 6 hr prn nausea or dry heaves, explaining that SL may be best if unable to keep down  po's.  DISCUSSION: medications and labs as above.  Assessment/Plan: 1.metastatic lobular right breast cancer extensively involving bone: history as above, clearly improved on low dose palliative taxol and xgeva. OK to treat as long as platelets are >20k as thrombocytopenia is primarily related to marrow replacement by tumor. She will have day  15 with xgeva. She will have 1 week off and then return on 9/2 to see Dr. Marko Plume with plans to begin day 1 on 9/4. 2.Anemia: likely multifactorial with marrow replacement, ongoing chemo and multiple blood draws. Some better. Continue hemocyte and follow.  3.PAC in  4.po intake good now, on marinol  5.Vertigo is being treated by PT. We also discussed the use of antiemetics and Meclizine. They will let me know if unsteadiness recurs, as she is certainly at risk for brain mets or intracranial bleeding. 6. Living Will and HCPOA done and she will bring copies to be scanned into EMR. Husband is HCPOA. She does not want resuscitation or life support in irreversible situation.  7.Vitamin D deficiency summer and fall 2014:resolved, just maintenance Vit D appropriate now. 8. 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 With low platelets best to use antiemetics promptly to keep these controlled as best possible.   They prefer visits and rx on Fridays as possible, tho they will see provider on other days if necessary. Chemo and xgeva orders confirmed. CBC and CMET weekly with treatments. Time spent 25 min including >50% counseling and coordination of care.    Mikey Bussing, NP   07/16/2014, 9:23 AM

## 2014-07-16 NOTE — Patient Instructions (Signed)
[facility] Discharge Instructions for Patients Receiving Chemotherapy  Today you received the following chemotherapy agents: Taxol.  To help prevent nausea and vomiting after your treatment, we encourage you to take your nausea medication as prescribed.   If you develop nausea and vomiting that is not controlled by your nausea medication, call the clinic.   BELOW ARE SYMPTOMS THAT SHOULD BE REPORTED IMMEDIATELY:  *FEVER GREATER THAN 100.5 F  *CHILLS WITH OR WITHOUT FEVER  NAUSEA AND VOMITING THAT IS NOT CONTROLLED WITH YOUR NAUSEA MEDICATION  *UNUSUAL SHORTNESS OF BREATH  *UNUSUAL BRUISING OR BLEEDING  TENDERNESS IN MOUTH AND THROAT WITH OR WITHOUT PRESENCE OF ULCERS  *URINARY PROBLEMS  *BOWEL PROBLEMS  UNUSUAL RASH Items with * indicate a potential emergency and should be followed up as soon as possible.  Feel free to call the clinic you have any questions or concerns. The clinic phone number is [phone].    

## 2014-07-16 NOTE — Patient Instructions (Signed)

## 2014-07-19 ENCOUNTER — Encounter: Payer: Self-pay | Admitting: *Deleted

## 2014-07-19 ENCOUNTER — Ambulatory Visit: Payer: 59 | Admitting: Physical Therapy

## 2014-07-19 DIAGNOSIS — IMO0001 Reserved for inherently not codable concepts without codable children: Secondary | ICD-10-CM | POA: Diagnosis not present

## 2014-07-19 NOTE — Progress Notes (Signed)
This RN faxed paperwork to [facility]. Documents sent to be scanned.

## 2014-07-23 ENCOUNTER — Telehealth: Payer: Self-pay

## 2014-07-23 DIAGNOSIS — C7951 Secondary malignant neoplasm of bone: Principal | ICD-10-CM

## 2014-07-23 DIAGNOSIS — C50919 Malignant neoplasm of unspecified site of unspecified female breast: Secondary | ICD-10-CM

## 2014-07-23 MED ORDER — LORAZEPAM 0.5 MG PO TABS: ORAL_TABLET | ORAL | Status: AC

## 2014-07-23 NOTE — Telephone Encounter (Signed)
MR. Onnen called for a refill on Gina [name]'s Ativan as she is still having difficulty with vertigo and nausea but it is improving with physical therapy.

## 2014-07-24 ENCOUNTER — Other Ambulatory Visit: Payer: Self-pay | Admitting: Oncology

## 2014-07-26 ENCOUNTER — Other Ambulatory Visit: Payer: Self-pay | Admitting: *Deleted

## 2014-07-26 ENCOUNTER — Ambulatory Visit: Payer: 59 | Admitting: Physical Therapy

## 2014-07-26 DIAGNOSIS — IMO0001 Reserved for inherently not codable concepts without codable children: Secondary | ICD-10-CM | POA: Diagnosis not present

## 2014-07-26 DIAGNOSIS — C7951 Secondary malignant neoplasm of bone: Principal | ICD-10-CM

## 2014-07-26 DIAGNOSIS — C50919 Malignant neoplasm of unspecified site of unspecified female breast: Secondary | ICD-10-CM

## 2014-07-26 MED ORDER — ONDANSETRON HCL 4 MG PO TABS: 8.0000 mg | ORAL_TABLET | Freq: Three times a day (TID) | ORAL | Status: AC | PRN

## 2014-07-28 ENCOUNTER — Ambulatory Visit (HOSPITAL_BASED_OUTPATIENT_CLINIC_OR_DEPARTMENT_OTHER): Payer: 59 | Admitting: Oncology

## 2014-07-28 ENCOUNTER — Other Ambulatory Visit (HOSPITAL_BASED_OUTPATIENT_CLINIC_OR_DEPARTMENT_OTHER): Payer: 59

## 2014-07-28 ENCOUNTER — Encounter: Payer: Self-pay | Admitting: Oncology

## 2014-07-28 ENCOUNTER — Ambulatory Visit (HOSPITAL_BASED_OUTPATIENT_CLINIC_OR_DEPARTMENT_OTHER): Payer: 59

## 2014-07-28 VITALS — BP 113/71 | HR 82 | Temp 97.1°F | Resp 18 | Ht 63.0 in | Wt 111.1 lb

## 2014-07-28 DIAGNOSIS — C7952 Secondary malignant neoplasm of bone marrow: Secondary | ICD-10-CM

## 2014-07-28 DIAGNOSIS — C50919 Malignant neoplasm of unspecified site of unspecified female breast: Secondary | ICD-10-CM

## 2014-07-28 DIAGNOSIS — R634 Abnormal weight loss: Secondary | ICD-10-CM

## 2014-07-28 DIAGNOSIS — R42 Dizziness and giddiness: Secondary | ICD-10-CM

## 2014-07-28 DIAGNOSIS — D696 Thrombocytopenia, unspecified: Secondary | ICD-10-CM

## 2014-07-28 DIAGNOSIS — Z95828 Presence of other vascular implants and grafts: Secondary | ICD-10-CM

## 2014-07-28 DIAGNOSIS — C7951 Secondary malignant neoplasm of bone: Secondary | ICD-10-CM

## 2014-07-28 DIAGNOSIS — C50119 Malignant neoplasm of central portion of unspecified female breast: Secondary | ICD-10-CM

## 2014-07-28 LAB — MANUAL DIFFERENTIAL
ALC: 1.6 10*3/uL (ref 0.9–3.3)
ANC (CHCC manual diff): 6.8 10*3/uL — ABNORMAL HIGH (ref 1.5–6.5)
Band Neutrophils: 21 % — ABNORMAL HIGH (ref 0–10)
Basophil: 3 % — ABNORMAL HIGH (ref 0–2)
Blasts: 2 % — ABNORMAL HIGH (ref 0–0)
LYMPH: 17 % (ref 14–49)
METAMYELOCYTES PCT: 9 % — AB (ref 0–0)
MONO: 5 % (ref 0–14)
MYELOCYTES: 17 % — AB (ref 0–0)
PLT EST: DECREASED
PROMYELO: 1 % — AB (ref 0–0)
SEG: 25 % — ABNORMAL LOW (ref 38–77)
nRBC: 17 % — ABNORMAL HIGH (ref 0–0)

## 2014-07-28 LAB — COMPREHENSIVE METABOLIC PANEL (CC13)
ALBUMIN: 3.7 g/dL (ref 3.5–5.0)
ALT: 33 U/L (ref 0–55)
AST: 66 U/L — AB (ref 5–34)
Alkaline Phosphatase: 114 U/L (ref 40–150)
Anion Gap: 12 mEq/L — ABNORMAL HIGH (ref 3–11)
BUN: 18.1 mg/dL (ref 7.0–26.0)
CHLORIDE: 101 meq/L (ref 98–109)
CO2: 26 mEq/L (ref 22–29)
Calcium: 9.9 mg/dL (ref 8.4–10.4)
Creatinine: 1 mg/dL (ref 0.6–1.1)
Glucose: 152 mg/dl — ABNORMAL HIGH (ref 70–140)
POTASSIUM: 3.9 meq/L (ref 3.5–5.1)
Sodium: 139 mEq/L (ref 136–145)
TOTAL PROTEIN: 7.1 g/dL (ref 6.4–8.3)
Total Bilirubin: 0.7 mg/dL (ref 0.20–1.20)

## 2014-07-28 LAB — CBC WITH DIFFERENTIAL/PLATELET
HEMATOCRIT: 32.5 % — AB (ref 34.8–46.6)
HGB: 10.6 g/dL — ABNORMAL LOW (ref 11.6–15.9)
MCH: 29.1 pg (ref 25.1–34.0)
MCHC: 32.5 g/dL (ref 31.5–36.0)
MCV: 89.4 fL (ref 79.5–101.0)
Platelets: 48 10*3/uL — ABNORMAL LOW (ref 145–400)
RBC: 3.64 10*6/uL — AB (ref 3.70–5.45)
RDW: 18.4 % — ABNORMAL HIGH (ref 11.2–14.5)
WBC: 9.4 10*3/uL (ref 3.9–10.3)

## 2014-07-28 MED ORDER — SODIUM CHLORIDE 0.9 % IJ SOLN
10.0000 mL | INTRAMUSCULAR | Status: DC | PRN
  Administered 2014-07-28: 10 mL via INTRAVENOUS
  Filled 2014-07-28: qty 10

## 2014-07-28 MED ORDER — OMEPRAZOLE 40 MG PO CPDR: 40.0000 mg | DELAYED_RELEASE_CAPSULE | Freq: Every day | ORAL | Status: AC

## 2014-07-28 MED ORDER — HEPARIN SOD (PORK) LOCK FLUSH 100 UNIT/ML IV SOLN
500.0000 [IU] | Freq: Once | INTRAVENOUS | Status: AC
  Administered 2014-07-28: 500 [IU] via INTRAVENOUS
  Filled 2014-07-28: qty 5

## 2014-07-28 MED ORDER — METOCLOPRAMIDE HCL 10 MG PO TABS: ORAL_TABLET | ORAL | Status: DC

## 2014-07-28 NOTE — Patient Instructions (Signed)

## 2014-07-28 NOTE — Progress Notes (Signed)
OFFICE PROGRESS NOTE   07/28/2014   Physicians:Gina [name]), Antony Contras (PCP), H.Mezer, Pauline Good   INTERVAL HISTORY:   Patient is seen, together with husband, still very symptomatic from vertigo symptoms for past couple of weeks, this like vertigo that resolved with neuroPT interventions in Dec 2014. She has been able to tolerate only 1 of 5 attempts at the neuroPT treatments with present problem, with nausea and dry heaves making treatments impossible otherwise. She tried ativan on regular basis, which caused sedation and difficulty comprehending conversation, and zofran 8 mg every 8 hrs which did not help. Dry heaves generally begin abruptly when she gets up in AM and when she gets up/ is mobile late in afternoon. PO intake is decreased, including supplements. Last ativan was 48 hrs ago. She had dry heaves 2-3 x yesterday and once this AM despite 8 mg zofran 20 min prior to getting up. She has not tried antivert, tho she has this at home. She denies HA and has not had fever. She has not been seen by ENT. She had CT head and MRI brain with first bout of similar symptoms in 10-2013. She has claustrophobia, but would be willing to attempt MRI if necessary; we will start with CT head now.  Last xgeva was 07-16-14. Last taxol (days 12-03-13 q 28 days) was also 07-16-14  She has PAC.  ONCOLOGIC HISTORY #1 patient presented with a subareolar mass in her right breast in October 2007,  biopsy 08/23/2006 showing invasive mammary carcinoma with lobular features. ER +100% PR 3% proliferation marker Ki-67 12% HER-2/neu 2+ by fish. MRI breasts 08/30/2006 showed an ill-defined enhancing mass in the right breast measuring 2.6 x 4.0 x 4.1 cm. Enhancement trailed out to the nipple but did not involve the nipple. There was also a small enhancing internal mammary node measuring 7 mm suspicious for metastatic disease. Left breast was negative.  #2 patient received neoadjuvant chemotherapy by Dr Eston Esters with  [facility] regimen given dose dense for 4 cycles followed by Taxotere, from 09/20/2006 through February 2008. She had lumpectomy 02/07/2007. However there was essentially residual tumor dispersed over an area 1.8 cm with some close surgical margins at the medial area, and she underwent a mastectomy on 03/19/2007.  #3 she was seen by radiation oncology, did not receive radiation.  #4 Femara 2.5 mg daily, begun after chemotherapy and completed 5 years of therapy in June 2014.  #5. 08/05/13 for evaluation had new anemia. PET/CT chest abdomen and pelvis identified diffuse lytic lesions throughout the axial and appendicular skeleton. Bone marrow biopsy was consistent with metastatic ER/PR positive breast cancer.  #6 status post Arimidex  #7 palliative single agent Taxol begun weekly starting 11-06-13, cycle 28 on 06-04-14.  She had no marker elevation 2009-2010-2011  Review of systems as above, also: No bleeding. No new or different pain, Bowels are moving. PAC ok. No LE swelling. She has been anxious and upset at home with these symptoms  Remainder of 10 point Review of Systems negative.  Objective:  Vital signs in last 24 hours:  BP 113/71  Pulse 82  Temp(Src) 97.1 F (36.2 C) (Oral)  Resp 18  Ht 5' 3"  (1.6 m)  Wt 111 lb 1.6 oz (50.395 kg)  BMI 19.69 kg/m2 Weight is down 2 lbs. Pale, respirations not labored RA, looks more weak generally. Alert, very pleasant and cooperative, not really clear history from patient and may be a little confused. Ambulatory slowly with husband's assistance.  Husband very supportive and  gives good history.  HEENT:PERRL, sclerae not icteric. Oral mucosa moist without lesions, posterior pharynx clear.  Neck supple. No JVD. Alopecia Lymphatics:no cervical,suraclavicular, axillary adenopathy Resp: clear to auscultation bilaterally  Cardio: regular rate and rhythm. No gallop. GI: soft, nontender, not distended, no mass or organomegaly. Some bowel sounds.   Musculoskeletal/ Extremities: without pitting edema, cords, tenderness Neuro: speech a little hesitant and not entirely clear at times. PERRL. Nystagmus to left. Strength symmetrical.  Skin without rash, ecchymosis, petechiae Breasts: right mastectomy site unchanged Portacath-without erythema or tenderness  Lab Results:  Results for orders placed in visit on 07/28/14  CBC WITH DIFFERENTIAL      Result Value Ref Range   WBC 9.4  3.9 - 10.3 10e3/uL   HGB 10.6 (*) 11.6 - 15.9 g/dL   HCT 32.5 (*) 34.8 - 46.6 %   Platelets 48 (*) 145 - 400 10e3/uL   MCV 89.4  79.5 - 101.0 fL   MCH 29.1  25.1 - 34.0 pg   MCHC 32.5  31.5 - 36.0 g/dL   RBC 3.64 (*) 3.70 - 5.45 10e6/uL   RDW 18.4 (*) 11.2 - 14.5 %  COMPREHENSIVE METABOLIC PANEL (IZ12)      Result Value Ref Range   Sodium 139  136 - 145 mEq/L   Potassium 3.9  3.5 - 5.1 mEq/L   Chloride 101  98 - 109 mEq/L   CO2 26  22 - 29 mEq/L   Glucose 152 (*) 70 - 140 mg/dl   BUN 18.1  7.0 - 26.0 mg/dL   Creatinine 1.0  0.6 - 1.1 mg/dL   Total Bilirubin 0.70  0.20 - 1.20 mg/dL   Alkaline Phosphatase 114  40 - 150 U/L   AST 66 (*) 5 - 34 U/L   ALT 33  0 - 55 U/L   Total Protein 7.1  6.4 - 8.3 g/dL   Albumin 3.7  3.5 - 5.0 g/dL   Calcium 9.9  8.4 - 10.4 mg/dL   Anion Gap 12 (*) 3 - 11 mEq/L  MANUAL DIFFERENTIAL      Result Value Ref Range   ANC ([facility] manual diff) 6.8 (*) 1.5 - 6.5 10e3/uL   ALC 1.6  0.9 - 3.3 10e3/uL   SEG 25 (*) 38 - 77 %   Band Neutrophils 21 (*) 0 - 10 %   LYMPH 17  14 - 49 %   MONO 5  0 - 14 %   Basophil 3 (*) 0 - 2 %   Metamyelocytes 9 (*) 0 - 0 %   Myelocytes 17 (*) 0 - 0 %   PROMYELO 1 (*) 0 - 0 %   Blasts 2 (*) 0 - 0 %   nRBC 17 (*) 0 - 0 %   Polychromasia Moderate  Slight   Basophilic Stippling Few  Negative   Tear Drop Cells Many  Negative   PLT EST Decreased  Adequate     Studies/Results:  Last scans CT CAP April 2015; last PET Sept 2014 Had MRI brain and CT head 10-2013  Medications: I have reviewed  the patient's current medications. I have given written and oral instructions to stop ativan and zofran for now as these are not helping dry heaves. She will try antivert 25 mg every 8 hrs beginning when she gets home today, and can add Reglan 10 mg q 6 hrs in addition if needed. Will hold further chemo until this problem is resolved  DISCUSSION: due to known metastatic  breast cancer and chronic thrombocytopenia, will repeat head scan even tho previous similar vertigo symptoms were inner ear related. Patient has claustrophobia so will start with CT head with contrast, hopefully in next 24 hours - requested as stat; she would attempt MRI if necessary.  She has never seen ENT, which may be helpful if no metastatic cause and if still unable to tolerate neuroPT otherwise. Likely will be appropriate to restage with CTs or PET before much longer - not discussed with other acute problems today.  Assessment/Plan: 1.vertigo symptoms in patient with metastatic breast cancer to bone and related chronic thrombocytopenia: same as problems in 10-2013 which were inner ear related, but no improvement/ unable to tolerate neuroPT interventions thus far. Try meclizine/antivert. CT head.  2.metastatic lobular right breast cancer to bone: history as above. Xgeva q 4 weeks. Hold taxol until present problem resolved.  3.Cytopenias: thrombocytopenia felt due to marrow involvement with breast cancer and likely some chemo effect; anemia due to chemo + metastatic disease + multiple blood draws, hemoglobin better today which may be some decreased hydration. Not neutropenic now. 4.PAC in 5. Weight loss in last couple of weeks with vertigo and dry heaves. Patient and husband aware of need for po hydration and nutrition  All questions answered and they are in agreement with plan. Husband understands medication instructions and certainly can call if needed prior to next scheduled visit. We will follow up CT pending. Time spent 30 min  including >50% counseling and coordination of care   LIVESAY,LENNIS P, MD   07/28/2014, 10:15 AM

## 2014-07-28 NOTE — Patient Instructions (Addendum)
Start Antivert (meclizine) for vertigo when you get home today.  Add the new nausea medicine Reglan (metoclopramide) that we are sending to pharmacy now, 10 mg every 6 hours if vertigo/dry heaves are not adequately controlled with antivert.  You do not need to take the ativan (lorazepam) or zofran (ondansetron) since those are not helping.

## 2014-07-29 ENCOUNTER — Ambulatory Visit (HOSPITAL_COMMUNITY)
Admission: RE | Admit: 2014-07-29 | Discharge: 2014-07-29 | Disposition: A | Discharge status: Home / Self Care | Payer: 59 | Source: Ambulatory Visit | Attending: Oncology | Admitting: Oncology

## 2014-07-29 ENCOUNTER — Other Ambulatory Visit: Payer: Self-pay | Admitting: Oncology

## 2014-07-29 ENCOUNTER — Encounter (HOSPITAL_COMMUNITY): Payer: Self-pay

## 2014-07-29 ENCOUNTER — Telehealth: Payer: Self-pay | Admitting: *Deleted

## 2014-07-29 ENCOUNTER — Ambulatory Visit: Payer: 59 | Admitting: Physical Therapy

## 2014-07-29 ENCOUNTER — Telehealth: Payer: Self-pay | Admitting: Oncology

## 2014-07-29 DIAGNOSIS — I672 Cerebral atherosclerosis: Secondary | ICD-10-CM | POA: Diagnosis not present

## 2014-07-29 DIAGNOSIS — R42 Dizziness and giddiness: Secondary | ICD-10-CM

## 2014-07-29 DIAGNOSIS — D696 Thrombocytopenia, unspecified: Secondary | ICD-10-CM

## 2014-07-29 DIAGNOSIS — G9389 Other specified disorders of brain: Secondary | ICD-10-CM | POA: Insufficient documentation

## 2014-07-29 DIAGNOSIS — C50919 Malignant neoplasm of unspecified site of unspecified female breast: Secondary | ICD-10-CM

## 2014-07-29 DIAGNOSIS — C50911 Malignant neoplasm of unspecified site of right female breast: Secondary | ICD-10-CM

## 2014-07-29 DIAGNOSIS — C7951 Secondary malignant neoplasm of bone: Secondary | ICD-10-CM | POA: Insufficient documentation

## 2014-07-29 DIAGNOSIS — C7952 Secondary malignant neoplasm of bone marrow: Secondary | ICD-10-CM

## 2014-07-29 MED ORDER — IOHEXOL 300 MG/ML  SOLN
100.0000 mL | Freq: Once | INTRAMUSCULAR | Status: AC | PRN
  Administered 2014-07-29: 100 mL via INTRAVENOUS

## 2014-07-29 MED ORDER — DEXAMETHASONE 4 MG PO TABS: ORAL_TABLET | ORAL | Status: DC

## 2014-07-29 NOTE — Telephone Encounter (Signed)
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  Reached husband on cell phone and discussed at length. Surprisingly, combination of meclizine and reglan has been very helpful since yesterday, with no dry heaves since 0900 yesterday, and fine to continue these medicines.  He tells me that he has known since early in this illness that CNS involvement was a possibility, and he understands that this is a very bad complication, which is difficult to improve even in patients who can tolerate aggressive treatments. She is not candidate for intrathecal chemotherapy, particularly due to chronic severe thrombocytopenia. We will start decadron 8 mg AM and late afternoon, each dose with food. RT may be consideration, especially as the balance symptoms have been so difficult; she has not seen radiation oncology in past so have made new patient referral + spoken directly with RT now.   Will not try further neurorehab PT, RN to cancel 9-4 PT appointment.   I have offered to meet with Mr and Mrs Pariseau tomorrow, however Mr Yablonski prefers to do this next week as wife's sister Karna Christmas is coming then. I will see them on 08-03-14 at 0930, and will check blood sugar at that time due to steroids.  Mr Yamin understands that we will not continue taxol.  Mr Delauder shared that Shamonique's mother has advanced dementia, weighs 25 lbs in SNF, and sister has medical problems including type 1 DM.  Guerline discussed wishes for her funeral with sister this week, as she wants to be cremated.  Mr Kapler plans to talk with wife and sister about this information, knows that I am glad to talk with them also prior to next week, otherwise we will discuss all of this together then. He was very appreciative of call and wrote down medication instructions.  Godfrey Pick, MD

## 2014-07-29 NOTE — Telephone Encounter (Signed)
APPT FOR RAD ONC ALREADY ON SCHEDULE. S/W PT HUSBAND RE APPT FOR 9/8 @ 9AM. ALSO PER HUSBAND RAD ONC DID CALL HIM RE APPT AND HE WILL F/U WITH THEM TOMORROW.

## 2014-07-29 NOTE — Telephone Encounter (Signed)
Received call report of CT Head done today from [facility] in radiology.  Report printed and gave to md on call Dr. Lona Kettle  for further instructions.  Report also given to Erasmo Downer, desk nurse for Dr. Marko Plume.

## 2014-07-29 NOTE — Telephone Encounter (Signed)
Impression: Positive for new widespread leptomeningeal metastatic disease.  Cerebellum and right sylvian fissure most affected. 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  Dr. Nevada Crane called reporting results of STAT CT head for this patient with new symptoms.  Radiology has called previously.  Per Thu RN, Dr. Marko Plume has seen report and to contact patient and spouse.

## 2014-07-29 NOTE — Telephone Encounter (Signed)
Spoke with Dr. Marko Plume and she is aware of patient's head CT scan results from today. She will call and speak directly with patient or husband. Per Dr. Marko Plume, decadron tablets sent to patient's pharmacy and all future appointments cancelled at [facility].

## 2014-07-30 ENCOUNTER — Ambulatory Visit: Payer: 59

## 2014-07-30 ENCOUNTER — Ambulatory Visit: Payer: 59 | Admitting: Physical Therapy

## 2014-08-03 ENCOUNTER — Telehealth: Payer: Self-pay | Admitting: *Deleted

## 2014-08-03 ENCOUNTER — Other Ambulatory Visit (HOSPITAL_BASED_OUTPATIENT_CLINIC_OR_DEPARTMENT_OTHER): Payer: 59

## 2014-08-03 ENCOUNTER — Ambulatory Visit (HOSPITAL_BASED_OUTPATIENT_CLINIC_OR_DEPARTMENT_OTHER): Payer: 59

## 2014-08-03 ENCOUNTER — Other Ambulatory Visit: Payer: Self-pay | Admitting: *Deleted

## 2014-08-03 ENCOUNTER — Encounter: Payer: Self-pay | Admitting: Oncology

## 2014-08-03 ENCOUNTER — Telehealth: Payer: Self-pay | Admitting: Oncology

## 2014-08-03 ENCOUNTER — Ambulatory Visit (HOSPITAL_BASED_OUTPATIENT_CLINIC_OR_DEPARTMENT_OTHER): Payer: 59 | Admitting: Oncology

## 2014-08-03 ENCOUNTER — Other Ambulatory Visit: Payer: Self-pay | Admitting: Oncology

## 2014-08-03 VITALS — BP 134/83 | HR 98 | Temp 98.4°F | Resp 18 | Ht 63.0 in | Wt 105.7 lb

## 2014-08-03 DIAGNOSIS — R42 Dizziness and giddiness: Secondary | ICD-10-CM

## 2014-08-03 DIAGNOSIS — C50919 Malignant neoplasm of unspecified site of unspecified female breast: Secondary | ICD-10-CM

## 2014-08-03 DIAGNOSIS — C7952 Secondary malignant neoplasm of bone marrow: Secondary | ICD-10-CM

## 2014-08-03 DIAGNOSIS — C801 Malignant (primary) neoplasm, unspecified: Secondary | ICD-10-CM

## 2014-08-03 DIAGNOSIS — C50911 Malignant neoplasm of unspecified site of right female breast: Secondary | ICD-10-CM

## 2014-08-03 DIAGNOSIS — Z95828 Presence of other vascular implants and grafts: Secondary | ICD-10-CM

## 2014-08-03 DIAGNOSIS — C7951 Secondary malignant neoplasm of bone: Principal | ICD-10-CM

## 2014-08-03 DIAGNOSIS — D696 Thrombocytopenia, unspecified: Secondary | ICD-10-CM

## 2014-08-03 DIAGNOSIS — C7949 Secondary malignant neoplasm of other parts of nervous system: Secondary | ICD-10-CM

## 2014-08-03 DIAGNOSIS — Z452 Encounter for adjustment and management of vascular access device: Secondary | ICD-10-CM

## 2014-08-03 LAB — COMPREHENSIVE METABOLIC PANEL (CC13)
ALBUMIN: 3.4 g/dL — AB (ref 3.5–5.0)
ALT: 36 U/L (ref 0–55)
AST: 57 U/L — AB (ref 5–34)
Alkaline Phosphatase: 122 U/L (ref 40–150)
Anion Gap: 13 mEq/L — ABNORMAL HIGH (ref 3–11)
BILIRUBIN TOTAL: 0.54 mg/dL (ref 0.20–1.20)
BUN: 20.7 mg/dL (ref 7.0–26.0)
CO2: 24 meq/L (ref 22–29)
Calcium: 9.1 mg/dL (ref 8.4–10.4)
Chloride: 100 mEq/L (ref 98–109)
Creatinine: 0.8 mg/dL (ref 0.6–1.1)
Glucose: 187 mg/dl — ABNORMAL HIGH (ref 70–140)
POTASSIUM: 3.8 meq/L (ref 3.5–5.1)
SODIUM: 137 meq/L (ref 136–145)
Total Protein: 6.8 g/dL (ref 6.4–8.3)

## 2014-08-03 LAB — CBC WITH DIFFERENTIAL/PLATELET
HCT: 35.4 % (ref 34.8–46.6)
HEMOGLOBIN: 11.3 g/dL — AB (ref 11.6–15.9)
MCH: 29 pg (ref 25.1–34.0)
MCHC: 31.9 g/dL (ref 31.5–36.0)
MCV: 90.8 fL (ref 79.5–101.0)
Platelets: 33 10*3/uL — ABNORMAL LOW (ref 145–400)
RBC: 3.9 10*6/uL (ref 3.70–5.45)
RDW: 17.9 % — AB (ref 11.2–14.5)
WBC: 12.4 10*3/uL — AB (ref 3.9–10.3)

## 2014-08-03 LAB — MANUAL DIFFERENTIAL
ALC: 3.5 10*3/uL — AB (ref 0.9–3.3)
ANC (CHCC manual diff): 7.2 10*3/uL — ABNORMAL HIGH (ref 1.5–6.5)
BLASTS: 3 % — AB (ref 0–0)
Band Neutrophils: 17 % — ABNORMAL HIGH (ref 0–10)
LYMPH: 28 % (ref 14–49)
MONO: 9 % (ref 0–14)
Metamyelocytes: 3 % — ABNORMAL HIGH (ref 0–0)
Myelocytes: 6 % — ABNORMAL HIGH (ref 0–0)
PLT EST: DECREASED
PROMYELO: 2 % — AB (ref 0–0)
SEG: 32 % — AB (ref 38–77)
nRBC: 19 % — ABNORMAL HIGH (ref 0–0)

## 2014-08-03 MED ORDER — SCOPOLAMINE 1 MG/3DAYS TD PT72: MEDICATED_PATCH | TRANSDERMAL | Status: AC

## 2014-08-03 MED ORDER — HYDROCODONE-ACETAMINOPHEN 5-325 MG PO TABS: 1.0000 | ORAL_TABLET | Freq: Four times a day (QID) | ORAL | Status: AC | PRN

## 2014-08-03 MED ORDER — NYSTATIN 100000 UNIT/ML MT SUSP: OROMUCOSAL | Status: AC

## 2014-08-03 MED ORDER — DRONABINOL 2.5 MG PO CAPS: 2.5000 mg | ORAL_CAPSULE | Freq: Two times a day (BID) | ORAL | Status: AC

## 2014-08-03 MED ORDER — SODIUM CHLORIDE 0.9 % IJ SOLN
10.0000 mL | INTRAMUSCULAR | Status: DC | PRN
  Administered 2014-08-03: 10 mL via INTRAVENOUS
  Filled 2014-08-03: qty 10

## 2014-08-03 MED ORDER — HEPARIN SOD (PORK) LOCK FLUSH 100 UNIT/ML IV SOLN
500.0000 [IU] | Freq: Once | INTRAVENOUS | Status: AC
  Administered 2014-08-03: 500 [IU] via INTRAVENOUS
  Filled 2014-08-03: qty 5

## 2014-08-03 MED ORDER — FLUCONAZOLE 100 MG PO TABS: ORAL_TABLET | ORAL | Status: AC

## 2014-08-03 NOTE — Telephone Encounter (Signed)
Message copied by Christa See on Tue Aug 03, 2014  3:16 PM ------      Message from: Gordy Levan      Created: Tue Aug 03, 2014  1:48 PM       Cyril Mourning - please let Mr Chavis know via his cell phone that I have spoken with Hospice medical director Dr Antonieta Loveless (who is also a medical oncologist), and best plan is going to be for [place] to ask Hospice to assist just AFTER we give the next xgeva injection, as long as Mrs Flinchbaugh is fairly stable until then. Plan is to give the xgeva day of my visit 9-21.      Thanks      Lennis ------

## 2014-08-03 NOTE — Telephone Encounter (Signed)
, °

## 2014-08-03 NOTE — Progress Notes (Signed)
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 This progression was found on CT head done 07-29-14; I discussed with husband by phone 07-29-14, and patient and sister also aware prior to visit today. Symptoms, initially with intermittent dry heaves and then with vertigo, began ~ 7 weeks ago.  Patient has been a little more comfortable with meclizine and reglan RTC for this past week. She started decadron 8 mg 2x daily on 07-29-14, which does not seem clearly to have helped, but she is tolerating so will continue at least another week. She has difficulty with changes in position and motion in car, tho is fairly comfortable as long as she is still. She had single episode of left thigh pain night of 07-30-14, resolved with one hydrocodone APAP.  She has had no overt bleeding and there was no intracranial bleeding on CT, despite chronic thrombocytopenia related to the metastatic breast cancer. She denies andy HA.  Appetite is poor tho she is eating a little, including with each steroid dose. She is sleeping well. She is not confused since stopping ativan.  Last weekly taxol was 07-16-14. Last xgeva was 07-16-14.  She has PAC, used for the CT head on 07-29-14.   ONCOLOGIC HISTORY #1 patient presented with a subareolar mass in her right breast in October 2007, with ultrasound-guided biopsy 08/23/2006 showing invasive mammary carcinoma with lobular features. ER +100% PR 3% proliferation marker Ki-67 12% HER-2/neu 2+ by fish. MRI breasts 08/30/2006 showed an ill-defined enhancing mass in the right breast measuring 2.6 x 4.0 x 4.1 cm. Enhancement trailed out to the nipple but did not involve the nipple. There was also a small enhancing internal  mammary node measuring 7 mm suspicious for metastatic disease. Left breast was negative.  #2 patient received neoadjuvant chemotherapy by Dr Eston Esters with [facility] regimen given dose dense for 4 cycles followed by Taxotere, from 09/20/2006 through February 2008. She had lumpectomy 02/07/2007. However there was essentially residual tumor dispersed over an area 1.8 cm with some close surgical margins at the medial area, and she underwent a mastectomy on 03/19/2007.  #3 she was seen by radiation oncology, did not receive radiation.  #4 Femara 2.5 mg daily, begun after chemotherapy and completed 5 years of therapy in June 2014.  #5. 08/05/13 for evaluation had new anemia. PET/CT chest abdomen and pelvis identified diffuse lytic lesions throughout the axial and appendicular skeleton. Bone marrow biopsy was consistent with metastatic ER/PR positive breast cancer.  #6 status post Arimidex  #7 palliative single agent Taxol begun weekly starting 11-06-13, cycle 28 on 06-04-14.  She had no marker elevation 2009-2010-2011  Review of systems as above, also: No fever. Bowels ok. Mouth feels dry. Is able to get bed to chair and to BR with assistance of family. Remainder of 10 point Review of Systems negative.  Objective:  Vital signs in last 24 hours:  BP 134/83  Pulse 98  Temp(Src) 98.4 F (36.9 C) (Oral)  Resp 18  Ht 5' 3" (1.6 m)  Wt 105 lb 11.2 oz (47.945 kg)  BMI 18.73 kg/m2 weight is down ~ 5 lbs in past week.  Alert, oriented and appropriate. Sitting upright in WC without difficulty, drinking liquids. Pale, looks cachectic and generally weak but NAD. Alopecia  HEENT:PERRL, sclerae not icteric. Oral mucosa moist with slight candida right buccal mucosa and tongue, posterior pharynx clear.  Neck supple. No JVD.  Lymphatics:no cervical,suraclavicular adenopathy Resp: clear to auscultation bilaterally  Cardio: regular rate and rhythm. No gallop. GI: soft, nontender, not distended, a few  BS. Musculoskeletal/ Extremities: without pitting edema, cords, tenderness Neuro: speech fluent today. Able to manipulate cup, moves all extremities in [facility] Skin without rash, ecchymosis, petechiae Portacath-without erythema or tenderness. Small blood on bandaid since access today.  Lab Results:  Results for orders placed in visit on 08/03/14  CBC WITH DIFFERENTIAL      Result Value Ref Range   WBC 12.4 (*) 3.9 - 10.3 10e3/uL   HGB 11.3 (*) 11.6 - 15.9 g/dL   HCT 35.4  34.8 - 46.6 %   Platelets 33 (*) 145 - 400 10e3/uL   MCV 90.8  79.5 - 101.0 fL   MCH 29.0  25.1 - 34.0 pg   MCHC 31.9  31.5 - 36.0 g/dL   RBC 3.90  3.70 - 5.45 10e6/uL   RDW 17.9 (*) 11.2 - 14.5 %  COMPREHENSIVE METABOLIC PANEL (FW26)      Result Value Ref Range   Sodium 137  136 - 145 mEq/L   Potassium 3.8  3.5 - 5.1 mEq/L   Chloride 100  98 - 109 mEq/L   CO2 24  22 - 29 mEq/L   Glucose 187 (*) 70 - 140 mg/dl   BUN 20.7  7.0 - 26.0 mg/dL   Creatinine 0.8  0.6 - 1.1 mg/dL   Total Bilirubin 0.54  0.20 - 1.20 mg/dL   Alkaline Phosphatase 122  40 - 150 U/L   AST 57 (*) 5 - 34 U/L   ALT 36  0 - 55 U/L   Total Protein 6.8  6.4 - 8.3 g/dL   Albumin 3.4 (*) 3.5 - 5.0 g/dL   Calcium 9.1  8.4 - 10.4 mg/dL   Anion Gap 13 (*) 3 - 11 mEq/L  MANUAL DIFFERENTIAL      Result Value Ref Range   ANC ([facility] manual diff) 7.2 (*) 1.5 - 6.5 10e3/uL   ALC 3.5 (*) 0.9 - 3.3 10e3/uL   SEG 32 (*) 38 - 77 %   Band Neutrophils 17 (*) 0 - 10 %   LYMPH 28  14 - 49 %   MONO 9  0 - 14 %   Metamyelocytes 3 (*) 0 - 0 %   Myelocytes 6 (*) 0 - 0 %   PROMYELO 2 (*) 0 - 0 %   Blasts 3 (*) 0 - 0 %   nRBC 19 (*) 0 - 0 %   Polychromasia Moderate  Slight   Basophilic Stippling Few  Negative   Tear Drop Cells Many  Negative   PLT EST Decreased  Adequate     Studies/Results: CT HEAD WITHOUT AND WITH CONTRAST   COMPARISON: Brain MRI 11/03/2013, head CT without contrast  11/02/2013.  FINDINGS:  Sclerotic and heterogeneous metastatic  disease to bone at the  skullbase. The bone mineralization is more permeative, but overall  the extent of involvement has not significantly changed since  December 2014. Calvarium has stable, more normal bone  mineralization. Visualized paranasal sinuses and mastoids are clear.  Visualized orbit soft tissues are within normal limits. Visualized  scalp soft tissues are within normal limits.  Calcified atherosclerosis at the skull base. Stable cerebral volume.  No ventriculomegaly. No midline shift, and basilar cisterns row are  stable. No acute intracranial hemorrhage identified. No evidence of  cortically based acute infarction identified. Major intracranial  vascular structures are enhancing.  There is subtle hyperdensity along the periphery of the anterior  right frontal lobe. There is abnormal postcontrast enhancement there  and at the anterior right sylvian fissure (see series 4, image 10).  Furthermore, there is florid abnormal enhancement of cerebellar  folia, especially over the superior cerebellum but also tracking  laterally. There is also curvilinear leptomeningeal enhancement  along the more superior or anterior right frontal lobe (series 4,  image 17).  Despite these findings, no definite vasogenic edema by CT. No  significant intracranial mass effect.  IMPRESSION:  1. Positive for new widespread leptomeningeal metastatic disease.  Cerebellum and right sylvian fissure most affected. 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  2. Despite #1, no intracranial mass effect at this time.  3. Osseous metastatic disease at the skullbase appears stable to  mildly progressed since December 2014.   PACS images did not link to this CT report. I spoke with radiology and the problem was corrected after patient's visit. Images reviewed by MD when available after visit  Medications: I have reviewed the patient's current  medications with family and written notes on their list. OK to hold oral iron and calcium if she prefers. WIll not use ativan or zofran for now, as these were not helpful recently. Continue decadron 8 mg bid at least until Dr [facility] appointment, then consider DC if no obvious benefit.Add diflucan 100 mg daily x 7 then start nystatin 2-3x daily (as long as continues steroids or has concern for thrush). Add scopalamine patch. Marinol has been helpful for appetite so will continue. Keep pain medication available.   DISCUSSION: We have discussed propensity for lobular breast cancer to metastasize in this fashion. They understand that, unfortunately, we do not have good interventions for this leptomeningeal carcinomatosis. Intrathecal chemotherapy is generally rigorous, with side effects that can include nausea/ vomiting, and could not be safely attempted due to her chronic thrombocytopenia. She is to see Dr Kyung Rudd on 08-09-14, to consider palliative radiation, tho he may not feel that this would be worthwhile. I do not feel that further chemotherapy will be beneficial, and patient and husband agree with this.They are concerned about the episode of pain in left thigh, and want to continue xgeva for now. I have told them that, in retrospect, this likely has been going on for at least last ~ 7 weeks, so perhaps not rapidly progressive, but still probably life expectancy only a few months and possibly less. Patient has expressed to RN that she has "fought the good fight for a long time". They have WC and walker at home. Sister plans to stay indefinitely. Husband and sister express appreciation for prompt phone call about the CT results, as today's work in appointment was first possible to talk with them directly. We have discussed Hospice referral, which I think will be best way to keep her comfortable thru rest of this illness, and will be best support for family. I will speak with Hospice, particularly re xgeva,  and we may need to wait on referral until just after next dose of the xgeva.  She will keep appointment with Dr Lisbeth Renshaw on 9-14. If no better with decadron by then and if Dr Lisbeth Renshaw does not feel this needs to continue, likely will DC decadron then. I will see her with xgeva on  08-16-14.  As above, note she now has difficulty riding in car for office appointments.  Assessment/Plan:  1.leptomeningeal carcinomatosis clinically and radiographically, in patient with known metastatic lobular breast cancer involving bone. She is not candidate for intrathecal chemotherapy particularly due to chronic thrombocytopenia. Continue meds to control dry heaves and vertigo as possible, tho may not need decadron depending on situation next week. I will discuss with hospice  2.metastatic lobular right breast cancer to bone: have DCd taxol, but family wants to continue xgeva for now. 3.chronic thrombocytopenia:remarkable little bleeding despite platelet counts ~20K for months. Felt secondary to marrow replacement with metastatic cancer 4.PAC in 5.progressive weight loss 6.inner ear problems with vertigo Dec 2014 7.advance directives and HCPOA in place  All questions answered. Patient and family understand and are in agreement with plans, and know to call at any time if needed. They have all expressed appreciation for visit today. Time spent 35 min including >50% counseling and coordination of care  LIVESAY,LENNIS P, MD   08/03/2014, 10:03 AM

## 2014-08-03 NOTE — Patient Instructions (Signed)

## 2014-08-03 NOTE — Telephone Encounter (Signed)
Spoke with Mr. Smitherman as noted below by Dr. Marko Plume. Also told Mr. Uballe to call us back if for any reason Mrs. Streck decides she would like hospice sooner and to forgo the next xgeva injection. Husband agreeable to this and appreciative of call.

## 2014-08-04 ENCOUNTER — Encounter: Payer: 59 | Admitting: Physical Therapy

## 2014-08-05 ENCOUNTER — Other Ambulatory Visit: Payer: Self-pay | Admitting: *Deleted

## 2014-08-05 DIAGNOSIS — C7951 Secondary malignant neoplasm of bone: Secondary | ICD-10-CM

## 2014-08-05 DIAGNOSIS — R42 Dizziness and giddiness: Secondary | ICD-10-CM

## 2014-08-05 DIAGNOSIS — C50919 Malignant neoplasm of unspecified site of unspecified female breast: Secondary | ICD-10-CM

## 2014-08-05 MED ORDER — MECLIZINE HCL 25 MG PO TABS: 25.0000 mg | ORAL_TABLET | Freq: Three times a day (TID) | ORAL | Status: AC | PRN

## 2014-08-05 MED ORDER — DEXAMETHASONE 4 MG PO TABS: ORAL_TABLET | ORAL | Status: DC

## 2014-08-05 MED ORDER — METOCLOPRAMIDE HCL 10 MG PO TABS: ORAL_TABLET | ORAL | Status: AC

## 2014-08-06 ENCOUNTER — Encounter: Payer: Self-pay | Admitting: Radiation Oncology

## 2014-08-06 ENCOUNTER — Other Ambulatory Visit: Payer: 59

## 2014-08-06 ENCOUNTER — Ambulatory Visit: Payer: 59 | Admitting: Oncology

## 2014-08-06 NOTE — Progress Notes (Addendum)
Histology and Location of Primary Cancer: Stage IV Metastatic lobular  breast cancer primary right breast Oct 2007  Sites of Visceral and Bony Metastatic Disease: Leptomeningeal carcinomatosis progression found on Ct  Head 07/29/14  Location(s) of Symptomatic Metastases: dry heaves and vertigo began  7 weeks ago  Past/Anticipated chemotherapy by medical oncology, if any: Dr. Marko Plume 08/03/14 ; last weekly Taxol was 07/16/14, has d/c's Taxol,  Last xgeva 07/16/14 ,  To continue Decadron 65m bid until Dr. MLisbeth Renshawappt  To consider palliative radiation, ,then consider  d/c if no obvious  Benefit, add Diflucan 100 mg x 7 days,then nystatin 2-3x day (as long as steroids continue or has concerns for thrush), add scopalamine patch. Continue Marinol  For appetite  Pain on a scale of 0-10 is: none   If Spine Met(s), symptoms, if any, include:  Bowel/Bladder retention or incontinence (please describe): No  Numbness or weakness in extremities (please describe): No, hx Vertigo,  Current Decadron regimen, if applicable: 8441mbid,  Ambulatory status? Walker? Wheelchair?:w/c,   SAFETY ISSUES:yes,   Prior radiation? No  Pacemaker/ICD? NO  Possible current pregnancy? No  Is the patient on methotrexate? No  Current Complaints / other details:  , Married, no children, dry heaves, appetite better, no headaches, weak, fatigued, stopped NU, is taking decadron, 41m77mid, on Diflucan for 7 days then will switch to nystatin solution, prn , jas panic attacks, nervous, and is claustrophobic  Allergies: Sudafed ,Tramadol

## 2014-08-09 ENCOUNTER — Encounter: Payer: Self-pay | Admitting: Radiation Oncology

## 2014-08-09 ENCOUNTER — Ambulatory Visit
Admission: RE | Admit: 2014-08-09 | Discharge: 2014-08-09 | Disposition: A | Discharge status: Home / Self Care | Payer: 59 | Source: Ambulatory Visit | Attending: Radiation Oncology | Admitting: Radiation Oncology

## 2014-08-09 VITALS — BP 144/70 | HR 82 | Temp 98.1°F | Resp 20 | Ht 63.0 in | Wt 103.1 lb

## 2014-08-09 DIAGNOSIS — Z79899 Other long term (current) drug therapy: Secondary | ICD-10-CM | POA: Insufficient documentation

## 2014-08-09 DIAGNOSIS — C7931 Secondary malignant neoplasm of brain: Secondary | ICD-10-CM | POA: Diagnosis not present

## 2014-08-09 DIAGNOSIS — C7949 Secondary malignant neoplasm of other parts of nervous system: Secondary | ICD-10-CM

## 2014-08-09 DIAGNOSIS — Z9221 Personal history of antineoplastic chemotherapy: Secondary | ICD-10-CM | POA: Diagnosis not present

## 2014-08-09 DIAGNOSIS — Z853 Personal history of malignant neoplasm of breast: Secondary | ICD-10-CM | POA: Insufficient documentation

## 2014-08-09 DIAGNOSIS — Z510 Encounter for antineoplastic radiation therapy: Secondary | ICD-10-CM | POA: Insufficient documentation

## 2014-08-09 DIAGNOSIS — Z9010 Acquired absence of unspecified breast and nipple: Secondary | ICD-10-CM | POA: Insufficient documentation

## 2014-08-09 DIAGNOSIS — D649 Anemia, unspecified: Secondary | ICD-10-CM | POA: Diagnosis not present

## 2014-08-09 DIAGNOSIS — C801 Malignant (primary) neoplasm, unspecified: Secondary | ICD-10-CM

## 2014-08-09 HISTORY — DX: Malignant neoplasm of bone and articular cartilage, unspecified: C41.9

## 2014-08-09 HISTORY — DX: Malignant neoplasm of brain, unspecified: C71.9

## 2014-08-09 NOTE — Progress Notes (Signed)
Please see the Nurse Progress Note in the MD Initial Consult Encounter for this patient. 

## 2014-08-11 ENCOUNTER — Ambulatory Visit (HOSPITAL_BASED_OUTPATIENT_CLINIC_OR_DEPARTMENT_OTHER): Payer: 59 | Admitting: Oncology

## 2014-08-11 ENCOUNTER — Other Ambulatory Visit: Payer: 59

## 2014-08-11 ENCOUNTER — Encounter: Payer: Self-pay | Admitting: Radiation Oncology

## 2014-08-11 ENCOUNTER — Telehealth: Payer: Self-pay | Admitting: Oncology

## 2014-08-11 ENCOUNTER — Ambulatory Visit
Admission: RE | Admit: 2014-08-11 | Discharge: 2014-08-11 | Disposition: A | Discharge status: Home / Self Care | Payer: 59 | Source: Ambulatory Visit | Attending: Radiation Oncology | Admitting: Radiation Oncology

## 2014-08-11 DIAGNOSIS — C7949 Secondary malignant neoplasm of other parts of nervous system: Secondary | ICD-10-CM

## 2014-08-11 DIAGNOSIS — Z510 Encounter for antineoplastic radiation therapy: Secondary | ICD-10-CM | POA: Diagnosis not present

## 2014-08-11 DIAGNOSIS — C801 Malignant (primary) neoplasm, unspecified: Principal | ICD-10-CM

## 2014-08-11 NOTE — Progress Notes (Signed)
  Radiation Oncology         [phone] ________________________________  Name: Gina [name] MRN: [id number]  Date: 08/11/2014  DOB: 01/18/1946    Simulation and treatment planning note  The patient presented for simulation for the patient's upcoming course of whole brain radiation treatment. The patient was placed in a supine position and a customized thermoplastic head cast was constructed to aid in patient immobilization during the treatment. This complex treatment device will be used on a daily basis. In this fashion a CT scan was obtained through the head and neck region and isocenter was placed near midline within the brain.  The patient will be planned to receive a course of whole brain radiation treatment to a dose of 30 gray in 10 fractions at 3 gray per fraction. To accomplish this, 2 customized blocks have been designed which corresponds to left and right whole brain radiation fields. These 2 complex treatment devices will be used on a daily basis during the course of radiation. A complex isodose plan is requested to insure that the target area is adequately covered in to facilitate optimization of the treatment plan. A forward planning technique will also be evaluated to determine if this approach significantly improves the plan.   ________________________________   Jodelle Gross, MD, PhD

## 2014-08-11 NOTE — Telephone Encounter (Signed)
Medical Oncology  I returned call to husband after his long conversation with RN this afternoon. Patient has been worse since 9-14, and particularly 9-15 and today, with confusion (does not know where she is) and hardly able to walk despite max assistance. She had simulation for RT today, tolerated this well despite claustrophobia after premedicating with ativan -- however she has previously had confusion after ativan and hopefully will not need to use ativan on any regular basis. She started scopalamine patch yesterday - still occasional dry heaves unchanged today, so husband will DC that now. They decreased meclizine to bid and reglan to tid on 07-31-14, these meds very helpful initially for dry heaves and no worse dry heaves since decreasing dose, so will keep these same now. She has always tolerated marinol well and appetite is still good, so no change in that. I did not increase decadron. Husband understood Dr Lisbeth Renshaw to say that radiation may be helpful and husband is hopeful that RT will improve situation.  I told husband that CNS involvement will likely be primary problem from here, may worsen rapidly, and will likely be cause of demise. I explained that bone involvement is not same level of concern and that further xgeva is not likely to improve quality of life. I have recommended that we discontinue xgeva now instead of trying to get to the dose next week, so that we can ask Hospice to assist now. He understands that Hospice can be involved during radiation. I have offered to see her on 08-13-14 instead of 08-16-14, however it is so difficult to get her to [facility] that husband prefers to keep my appointment on 9-21 same day as RT begins.  Husband may call us 9-18 if they want to proceed with Hospice referral, otherwise we can do this next week after my visit. He knows that he can call otherwise at any time if needed.  Godfrey Pick, MD

## 2014-08-12 DIAGNOSIS — Z510 Encounter for antineoplastic radiation therapy: Secondary | ICD-10-CM | POA: Diagnosis not present

## 2014-08-13 ENCOUNTER — Encounter: Payer: Self-pay | Admitting: Radiation Oncology

## 2014-08-13 ENCOUNTER — Encounter: Payer: Self-pay | Admitting: Specialist

## 2014-08-13 NOTE — Progress Notes (Signed)
Per request from social work because of distress screen of "6" which included expressed distress over spiritual issues, I called the patient's home. I spoke to a family member who told me she would be coming in Monday. We created a plan for me to contact her when she comes in to see Dr. Marko Plume and have treatment.   Epifania Gore, PhD, [place]

## 2014-08-13 NOTE — Progress Notes (Signed)
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   HISTORY OF PRESENT ILLNESS::Gina [name] is a 68 y.o. female who is seen for an initial consultation visit. The patient has a history of right-sided breast cancer, diagnosed in 2007. The patient initially presented with a 4.1 cm mass within the right breast which demonstrated invasive mammary carcinoma with lobular features. Receptor studies indicated that the tumor was estrogen receptor positive, progesterone receptor positive at 3%, and HER-2/neu positive.  The patient received neoadjuvant chemotherapy followed by lumpectomy. Close surgical margins were obtained and she then proceeded to undergo a mastectomy. She did not receive any radiation treatment. The patient began anti-hormonal treatment.  The patient developed anemia in September of 2014 and a PET scan revealed diffuse metastatic disease at that time. A bone marrow biopsy was consistent with metastatic breast cancer. The patient therefore proceeded with palliative chemotherapy.  The patient's recently has developed some vertigo with nausea and vomiting. She underwent a CT scan of the head with and without contrast on 07/29/2014. This was positive for new widespread leptomeningeal metastatic disease. The cerebellum and right sylvian fissure were affected the most. Osseous metastatic disease at the skull base appears stable to possibly mildly progressed since December of 2014. The patient has been started on steroids with Decadron at 8 mg twice a day. Possible improvement in her symptoms to a slight degree without a dramatic response. She  feels that she is doing better the most after beginning meclizine as well as Reglan. She continues to take all 3 of these medications. The patient also currently is taking Marinol for appetite stimulation.   PREVIOUS RADIATION THERAPY: No   PAST MEDICAL HISTORY:  has a past medical history of Myalgia and myositis, unspecified; Anemia; Breast cancer (04/28/2013); Bone cancer (08/20/13); and Brain cancer (ct 07/29/14).     PAST SURGICAL HISTORY: Past Surgical History  Procedure Laterality Date  . Mastectomy Right   . Tubal ligation  1971     FAMILY HISTORY: family history is not on file.   SOCIAL HISTORY:  reports that she has never smoked. She has never used smokeless tobacco. She reports that she does not drink alcohol or use illicit drugs.   ALLERGIES: Sudafed and Tramadol   MEDICATIONS:  Current Outpatient Prescriptions  Medication Sig Dispense Refill  . cetirizine (ZYRTEC) 10 MG tablet Take 10 mg by mouth daily.        Marland Kitchen dexamethasone (DECADRON) 4 MG tablet Take 2 tablets by mouth twice a day with food.  40 tablet  0  . dronabinol (MARINOL) 2.5 MG capsule Take 1 capsule (2.5 mg total) by mouth 2 (two) times daily before a meal.  60 capsule  0  . fluconazole (DIFLUCAN) 100 MG tablet Take one tablet daily for 7 days for thrush.  7 tablet  0  . HYDROcodone-acetaminophen (NORCO/VICODIN) 5-325 MG per tablet Take 1-2 tablets by mouth every 6 (six) hours as needed for moderate pain.  30 tablet  0  . lidocaine-prilocaine (EMLA) cream Apply 1 application topically as needed.  30 g  1  . LORazepam (ATIVAN) 0.5 MG tablet Place 1 tablet under  the tongue or swallow every 6 hours as needed for nausea. Will make drowsy.  20 tablet  0  . meclizine (ANTIVERT) 25 MG tablet Take 1 tablet (25 mg total) by mouth 3 (three) times daily as needed for dizziness.  30 tablet  2  . metoCLOPramide (REGLAN) 10 MG tablet Take one tablet by mouth every 6 hours for nausea and dry heaves for the first few days then  take as needed.  30 tablet  2  . Multiple Vitamins-Minerals (OSTEO COMPLEX PO) Take by mouth.      Marland Kitchen omeprazole (PRILOSEC) 40 MG capsule Take 1 capsule (40 mg total) by mouth daily.  30 capsule  4  . ondansetron (ZOFRAN) 4 MG tablet Take 2 tablets (8 mg total) by mouth every 8 (eight) hours as needed for nausea.  30 tablet  1  . venlafaxine XR (EFFEXOR-XR) 37.5 MG 24 hr capsule Take 1 capsule (37.5 mg total) by mouth daily with breakfast.  30 capsule  9  . Calcium Carb-Cholecalciferol (CALCIUM 600 + D PO) Take 1 tablet by mouth 2 (two) times daily.      Marland Kitchen HEMOCYTE 324 MG TABS tablet Take 1 tablet (106 mg of iron total) by mouth daily.  30 each  3  . [place] Mushroom POWD 5 mLs by Does not apply route daily.      . mirtazapine (REMERON) 15 MG tablet Take 15 mg by mouth every morning.       . nystatin (MYCOSTATIN) 100000 UNIT/ML suspension Swish and swallow 68m 2-3 times daily after diflucan tablets finished to prevent thrush.  480 mL  0  . scopolamine (TRANSDERM-SCOP) 1 MG/3DAYS Apply 1 patch behind ear every 3 days for motion sickness.  4 patch  3   No current facility-administered medications for this encounter.   Facility-Administered Medications Ordered in Other Encounters  Medication Dose Route Frequency Provider Last Rate Last Dose  . sodium chloride 0.9 % injection 10 mL  10 mL Intravenous PRN Amy G Berry, PA-C   10 mL at 03/05/14 1344  . sodium chloride 0.9 % injection 10 mL  10 mL Intravenous PRN KDeatra Robinson MD   10 mL at 03/18/14 0943  . sodium chloride 0.9 % injection 10 mL  10 mL Intravenous PRN Lennis PMarion Downer MD   10 mL at 03/25/14 1117     REVIEW OF SYSTEMS:  A 15 point review of systems is documented in the electronic medical record. This was obtained by the nursing staff. However, I reviewed this with the patient to discuss relevant findings and make appropriate changes.  Pertinent items are noted in HPI.    PHYSICAL EXAM:  height is 5' 3"  (1.6 m) and weight is 103 lb  1.6 oz (46.766 kg). Her oral temperature is 98.1 F (36.7 C). Her blood pressure is 144/70 and her pulse is 82. Her respiration is 20 and oxygen saturation is 99%.   ECOG = 2  0 - Asymptomatic (Fully active, able to carry on all predisease activities without restriction)  1 - Symptomatic but completely ambulatory (Restricted in physically strenuous activity but ambulatory and able to carry out work of a light or sedentary nature. For example, light housework, office work)  2 - Symptomatic, <50% in bed during the day (Ambulatory and capable of all self care but unable to carry out any work activities. Up and about more than 50% of waking hours)  3 - Symptomatic, >50% in bed, but not bedbound (Capable of only  limited self-care, confined to bed or chair 50% or more of waking hours)  4 - Bedbound (Completely disabled. Cannot carry on any self-care. Totally confined to bed or chair)  5 - Death   Eustace Pen MM, Creech RH, Tormey DC, et al. [phone]). "Toxicity and response criteria of the [facility]". [place] Oncol. 5 (6): 649-55  General: Well-developed, in no acute distress HEENT: Normocephalic, atraumatic; oral cavity clear without evidence of thrush Neck: Supple without any lymphadenopathy Cardiovascular: Regular rate and rhythm Respiratory: Clear to auscultation bilaterally GI: Soft, nontender, normal bowel sounds Extremities: No edema present Neuro: No focal deficits, stage is fluent. Strength 5 out of 5 bilaterally     LABORATORY DATA:  Lab Results  Component Value Date   WBC 12.4* 08/03/2014   HGB 11.3* 08/03/2014   HCT 35.4 08/03/2014   MCV 90.8 08/03/2014   PLT 33* 08/03/2014   Lab Results  Component Value Date   NA 137 08/03/2014   K 3.8 08/03/2014   CL 108 11/04/2013   CO2 24 08/03/2014   Lab Results  Component Value Date   ALT 36 08/03/2014   AST 57* 08/03/2014   ALKPHOS 122 08/03/2014   BILITOT 0.54 08/03/2014      RADIOGRAPHY: Ct Head W Wo  Contrast  07/29/2014   ADDENDUM REPORT: 07/29/2014 15:54  ADDENDUM: Study discussed by telephone with Nurse Emmie Niemann. at the [place] on 07/29/2014 at 1549 hrs.   Electronically Signed   By: Lars Pinks M.D.   On: 07/29/2014 15:54   07/29/2014   CLINICAL DATA:  68 year old female with metastatic breast cancer, chemotherapy in progress. Vertigo weakness nausea vomiting. Subsequent encounter.  EXAM: CT HEAD WITHOUT AND WITH CONTRAST  TECHNIQUE: Contiguous axial images were obtained from the base of the skull through the vertex without and with intravenous contrast  CONTRAST:  173m OMNIPAQUE IOHEXOL 300 MG/ML  SOLN  COMPARISON:  Brain MRI 11/03/2013, head CT without contrast 11/02/2013.  FINDINGS: Sclerotic and heterogeneous metastatic disease to bone at the skullbase. The bone mineralization is more permeative, but overall the extent of involvement has not significantly changed since December 2014. Calvarium has stable, more normal bone mineralization. Visualized paranasal sinuses and mastoids are clear.  Visualized orbit soft tissues are within normal limits. Visualized scalp soft tissues are within normal limits.  Calcified atherosclerosis at the skull base. Stable cerebral volume. No ventriculomegaly. No midline shift, and basilar cisterns row are stable. No acute intracranial hemorrhage identified. No evidence of cortically based acute infarction identified. Major intracranial vascular structures are enhancing.  There is subtle hyperdensity along the periphery of the anterior right frontal lobe. There is abnormal postcontrast enhancement there and at the anterior right sylvian fissure (see series 4, image 10). Furthermore, there is florid abnormal enhancement of cerebellar folia, especially over the superior cerebellum but also tracking laterally. There is also curvilinear leptomeningeal enhancement along the more superior or anterior right frontal lobe (series 4, image 17).  Despite these findings,  no definite vasogenic edema by CT. No significant intracranial mass effect.  IMPRESSION: 1. Positive for new widespread leptomeningeal metastatic disease. Cerebellum and right sylvian fissure most affected. 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 2. Despite #1, no intracranial mass effect at this time. 3. Osseous metastatic disease at the skullbase appears stable to mildly progressed since December 2014.  Electronically Signed: By: LLars PinksM.D. On: 07/29/2014 15:02  IMPRESSION: The patient has metastatic breast cancer now with evidence of leptomeningeal disease.  The patient has been having some nausea and vomiting as well as vertigo. Her symptoms are somewhat improved on her current medications. She is taking Decadron 8 mg twice a day without any major improvement from this one medication according to the patient and her family although I discussed with him that I would recommend continuing this regimen for the time being until we are able to begin her radiation treatment.  For the patient's new finding of leptomeningeal disease, I discussed with the patient that standard treatment would comprise whole brain radiation treatments. We therefore discussed this treatment in some detail including the possible benefit of treatment as well as possible side effects and risks. We discussed that the major alternative to this is simply supportive care alone. The patient at this time indicated that she wished to remain aggressive with her treatment. There is not evidence of significant systemic progression at this time which did play into this decision to proceed with treatment for intracranial disease.   PLAN: The patient will proceed with a simulation in the near future such that we can proceed with treatment planning.  I recommended that we proceed in the near future if we are going to begin radiation treatment to the brain. She indicated that  she wished to complete simulation within the next several days with a target of beginning her radiation treatment next Monday on 08/16/2014. I anticipate 2 weeks of radiation treatment corresponding to 30 gray in 10 fractions.      ________________________________   Jodelle Gross, MD, PhD   **Disclaimer: This note was dictated with voice recognition software. Similar sounding words can inadvertently be transcribed and this note may contain transcription errors which may not have been corrected upon publication of note.**

## 2014-08-15 ENCOUNTER — Other Ambulatory Visit: Payer: Self-pay | Admitting: Oncology

## 2014-08-16 ENCOUNTER — Telehealth: Payer: Self-pay

## 2014-08-16 ENCOUNTER — Telehealth: Payer: Self-pay | Admitting: Oncology

## 2014-08-16 ENCOUNTER — Ambulatory Visit: Payer: 59

## 2014-08-16 ENCOUNTER — Ambulatory Visit: Payer: 59 | Admitting: Radiation Oncology

## 2014-08-16 ENCOUNTER — Other Ambulatory Visit: Payer: 59

## 2014-08-16 ENCOUNTER — Ambulatory Visit: Payer: 59 | Admitting: Oncology

## 2014-08-16 NOTE — Telephone Encounter (Signed)
Discussed hospice referral with Mr. Bandel.   Ms. Yorke condition has significantly delined over the last 3 days.  She is unable to get up out of bed.  Family lifting her to to place on bedside commode. Hospice referral made today with Jocelyn Lamer in admitting for [facility]. Appointment scheduled for 1030 tomorrow for admission vist, however, spoke with Jocelyn Lamer again and requested visit today if possible as Mr. Venkatesh very emotional on phone and feeling overwhelmed with wife's decline. Jocelyn Lamer will call patient's family and discuss admission for this evening ~4pm.

## 2014-08-16 NOTE — Telephone Encounter (Signed)
Mikki Santee, Camelia's husband, called and said that Erandi is not doing well and will not be able to start treatment today.  He said they will most likely be calling in hospice.  Will forward note to Gaspar Garbe, RN and Dr. Lisbeth Renshaw and notify [place] [facility].

## 2014-08-17 ENCOUNTER — Ambulatory Visit: Payer: 59 | Admitting: Radiation Oncology

## 2014-08-17 ENCOUNTER — Ambulatory Visit: Payer: 59

## 2014-08-18 ENCOUNTER — Telehealth: Payer: Self-pay | Admitting: *Deleted

## 2014-08-18 ENCOUNTER — Ambulatory Visit: Payer: 59

## 2014-08-18 ENCOUNTER — Telehealth: Payer: Self-pay | Admitting: Oncology

## 2014-08-18 MED ORDER — DEXAMETHASONE 4 MG PO TABS: ORAL_TABLET | ORAL | Status: AC

## 2014-08-18 NOTE — Telephone Encounter (Signed)
Received VM from Gina [name], patient's sister requesting decadron refill. Called and spoke with Gina [name], patient's husband. Gina [name] states that Gina [name] is still eating and drinking and able to swallow pills without any problems. States Hospice RN came Monday night to set up patient with Hospice services, social worker came yesterday and a hospital bed will be arriving today. Let Gina [name] know that Dr. Marko Plume will refill decadron tablets and that prescription was sent to [facility] in [place].

## 2014-08-18 NOTE — Telephone Encounter (Signed)
Medical Oncology  I spoke directly with Hospice RN, who evaluated patient today - Mrs. Vangorden would open eyes to voice, speech difficult, pale and tachycardic, complaining of some pain but improved with prn hydrocodone. Able to get to [facility] with assistance, pivoting from bed to [facility] only. Nurse expects they will need adjustments in pain medication soon.  I then spoke with husband by phone, as he had LM wondering "what to do about radiation treatments"; he tells me that patient has asked him about the radiation. Husband tells me that nausea and dry heaves have improved since she has been less mobile "none as long as she is still". He states that she is having some pain from thighs to knees "like when the cancer started", relieved with prn pain medication. He says she is "lucid at times". Appetite has been better, possibly with decadron, which they want to continue for now. We discussed fact that activity and movement, especially riding in car, aggravates the dry heaves. Ambulance transport from home to RT would be less comfortable than private vehicle, likely even less well tolerated, and likely would not be covered by insurance. I told him that I do not think possible benefit from radiation would be worth going thru it, and husband was in prompt agreement with recommendation not to try radiation.  Hospital bed has been delivered (needs sheets). Sister is helping at the home.  All questions answered, husband knows to call if needed and he expressed appreciation for the conversation.  Godfrey Pick, MD

## 2014-08-18 NOTE — Telephone Encounter (Signed)
Received VM from Mikki Santee, patient's husband, asking for guidance on radiation for Gina [name] since she missed appointment on Monday. Information passed along to Dr. Marko Plume and she will call Mikki Santee back and speak with him.

## 2014-08-19 ENCOUNTER — Ambulatory Visit: Admission: RE | Admit: 2014-08-19 | Payer: 59 | Source: Ambulatory Visit

## 2014-08-19 NOTE — Telephone Encounter (Signed)
Gave verbal order to [place] [phone] for DNR form to be brought out to the patient's home per Dr. Marko Plume.

## 2014-08-20 ENCOUNTER — Ambulatory Visit: Payer: 59

## 2014-08-23 ENCOUNTER — Telehealth: Payer: Self-pay

## 2014-08-23 ENCOUNTER — Ambulatory Visit: Payer: 59

## 2014-08-23 NOTE — Telephone Encounter (Signed)
[place] was calling for an order for a foley catheter to be placed for comfort care.  Told her Dr. Marko Plume said that a foley would be fine.

## 2014-08-24 ENCOUNTER — Ambulatory Visit: Payer: 59

## 2014-08-25 ENCOUNTER — Ambulatory Visit: Payer: 59

## 2014-08-26 ENCOUNTER — Ambulatory Visit: Payer: 59

## 2014-08-27 ENCOUNTER — Ambulatory Visit: Payer: 59

## 2014-08-30 ENCOUNTER — Ambulatory Visit: Payer: 59

## 2014-08-30 ENCOUNTER — Encounter: Payer: Self-pay | Admitting: Radiation Oncology

## 2014-09-07 IMAGING — XA IR FLUORO GUIDE CV LINE*R*
1 series · 2 of 2 positions shown · non-contrast
Comparison: none

CLINICAL DATA: Breast carcinoma, needs venous access for
chemotherapy.

EXAM:
TUNNELED PORT CATHETER PLACEMENT WITH ULTRASOUND AND FLUOROSCOPIC
GUIDANCE
TECHNIQUE: The procedure, risks, benefits, and alternatives were explained to
the patient. Questions regarding the procedure were encouraged and
answered. The patient understands and consents to the procedure. As
antibiotic prophylaxis, cefazolin 2 g was ordered pre-procedure and
administered intravenously within one hour of incision. Patency of
the right IJ vein was confirmed with ultrasound with image
documentation. An appropriate skin site was determined. Skin site
was marked. Region was prepped using maximum barrier technique
including cap and mask, sterile gown, sterile gloves, large sterile
sheet, and Chlorhexidine as cutaneous antisepsis. The region was
infiltrated locally with 1% lidocaine. Under real-time ultrasound
guidance, the right IJ vein was accessed with a 21 gauge
micropuncture needle; the needle tip within the vein was confirmed
with ultrasound image documentation. Needle was exchanged over a 018
guidewire for transitional dilator which allowed passage of the
Benson wire into the IVC. Over this, the transitional dilator was
exchanged for a 5 French MPA catheter. A small incision was made on
the right anterior chest wall and a subcutaneous pocket fashioned.
The power-injectable port was positioned and its catheter tunneled
to the right IJ dermatotomy site. The MPA catheter was exchanged
over an Amplatz wire for a peel-away sheath, through which the port
catheter, which had been trimmed to the appropriate length, was
advanced and positioned under fluoroscopy with its tip at the
cavoatrial junction. Spot chest radiograph confirms good catheter
position and no pneumothorax. The pocket was closed with deep
interrupted and subcuticular continuous 3-0 Monocryl sutures. The
port was flushed per protocol. The incisions were covered with
Dermabond then covered with a sterile dressing. No immediate
complication.
ANESTHESIA/SEDATION:
Intravenous Fentanyl and Versed were administered as conscious
sedation during continuous cardiorespiratory monitoring by the
radiology RN, with a total moderate sedation time of twenty minutes.
FLUOROSCOPY TIME:  12 seconds

[Series 300: ir fluoro guide cv line*r* · 2 of 2 slices shown]
[im 1/2]
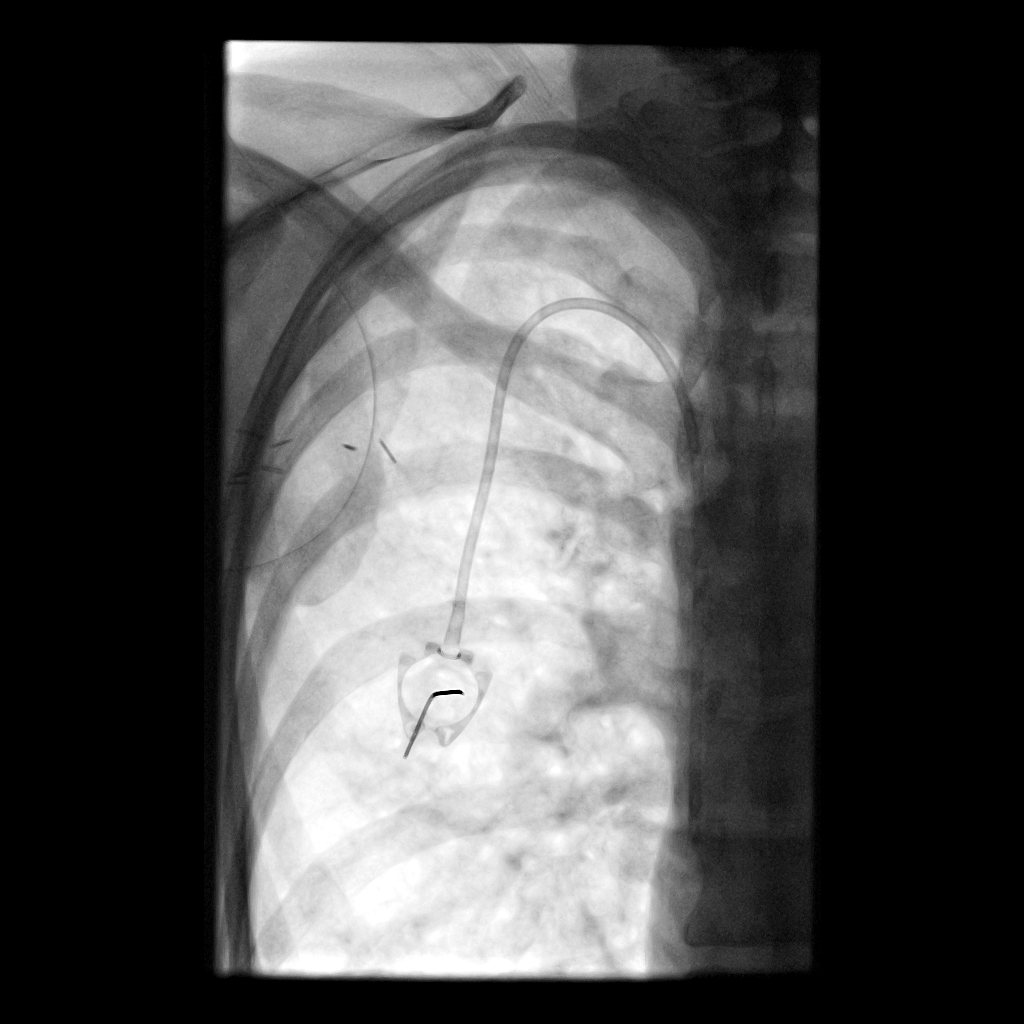
[im 2/2]
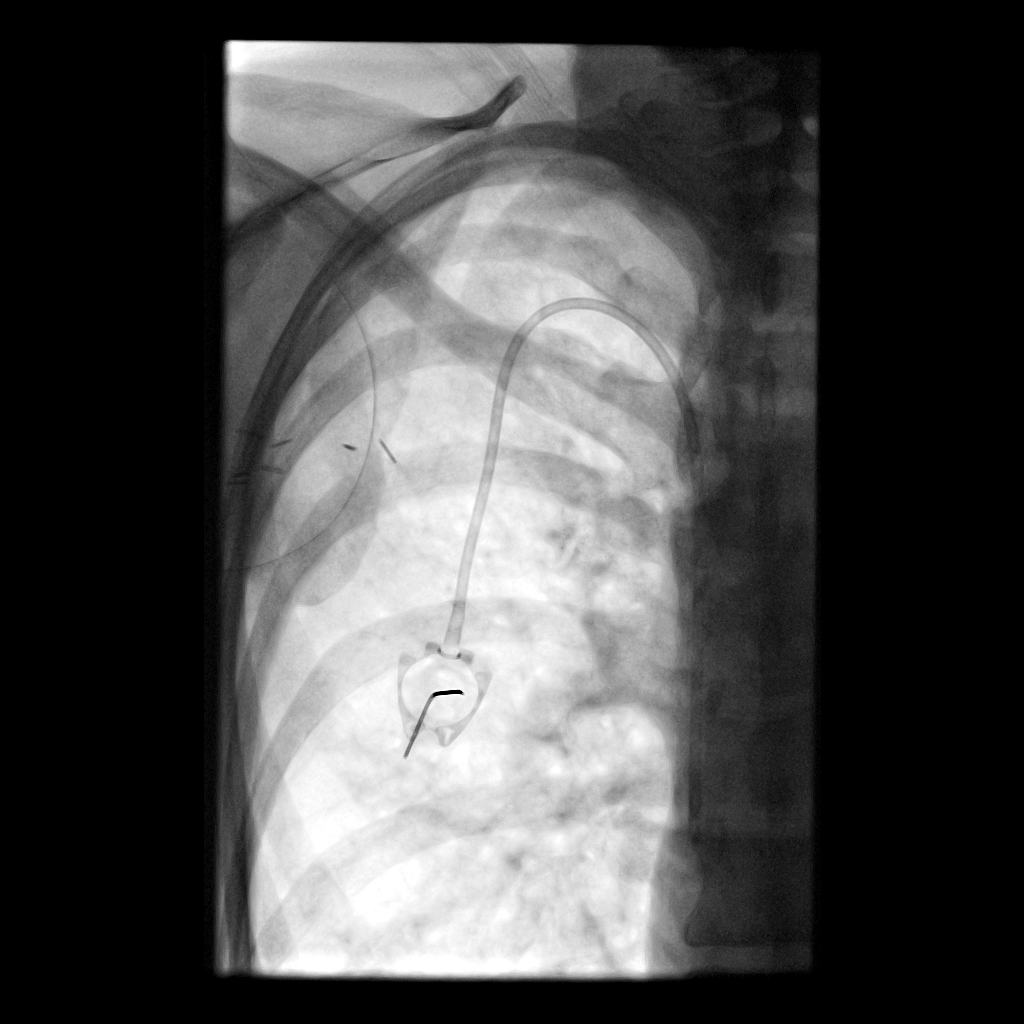

[2 of 2 positions shown; findings below may reference images not displayed]

IMPRESSION: Technically successful right IJ power-injectable port catheter
placement. Ready for routine use.

## 2014-09-26 DEATH — deceased

## 2015-01-31 NOTE — Progress Notes (Unsigned)
This encounter was created in error - please disregard.

## 2015-05-31 IMAGING — CT CT HEAD WO/W CM
1 of 2 series · 14 of 30 positions shown, 18 images · IV contrast (OMNIPAQUE)
Comparison: Brain MRI 11/03/2013, head CT without contrast
11/02/2013.

ADDENDUM:
Study discussed by telephone with [REDACTED]. at the [HOSPITAL] [HOSPITAL] on 07/29/2014 at 5715 hrs.
CLINICAL DATA: 68-year-old female with metastatic breast cancer,
chemotherapy in progress. Vertigo weakness nausea vomiting.
Subsequent encounter.

EXAM:
CT HEAD WITHOUT AND WITH CONTRAST
TECHNIQUE: Contiguous axial images were obtained from the base of the skull
through the vertex without and with intravenous contrast
CONTRAST:  100mL OMNIPAQUE IOHEXOL 300 MG/ML  SOLN

[Series 2: head w/o 4.8 h45s · axial · non-contrast · 0.43mm/px · z∈[-108,+34]mm · 14 of 36 slices shown, 18 images]
[im 3/36  brain]
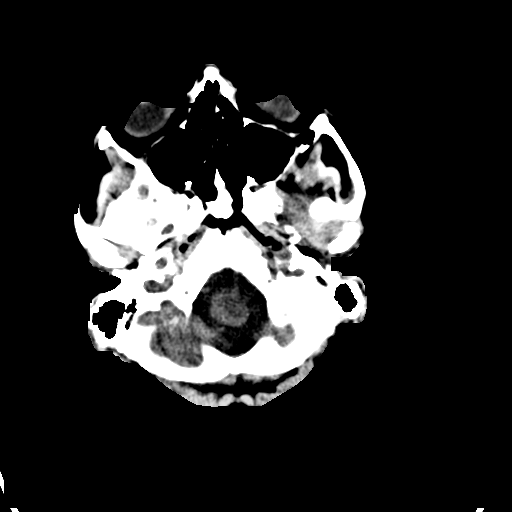
[im 3/36  bone]
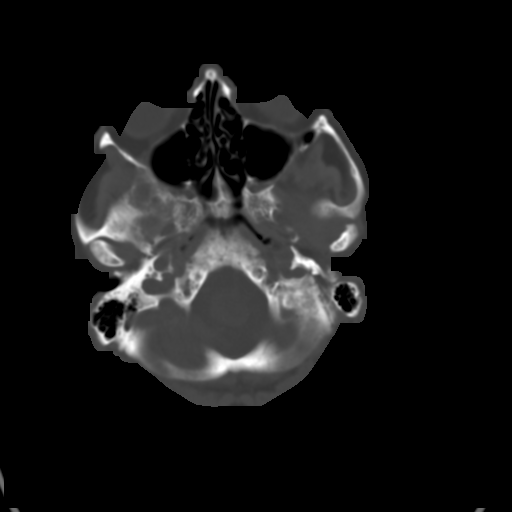
[im 5/36  brain]
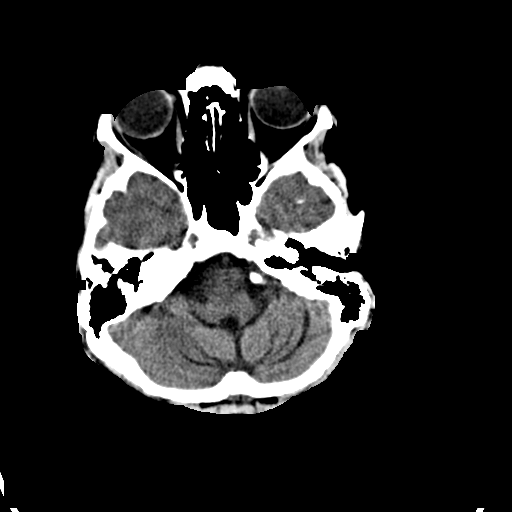
[im 8/36  brain]
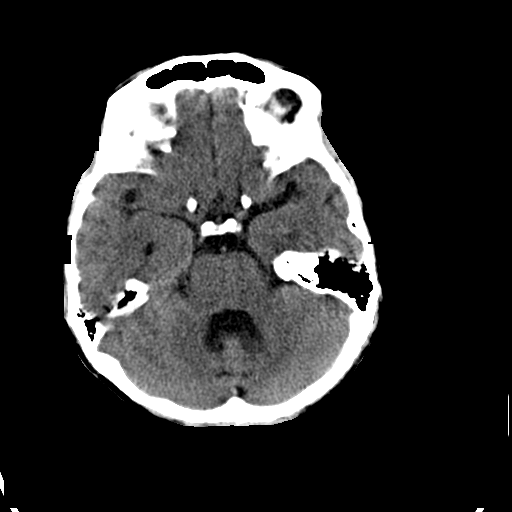
[im 10/36  brain]
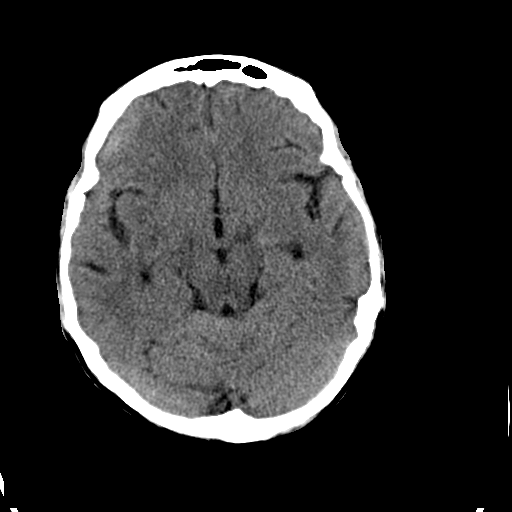
[im 12/36  brain]
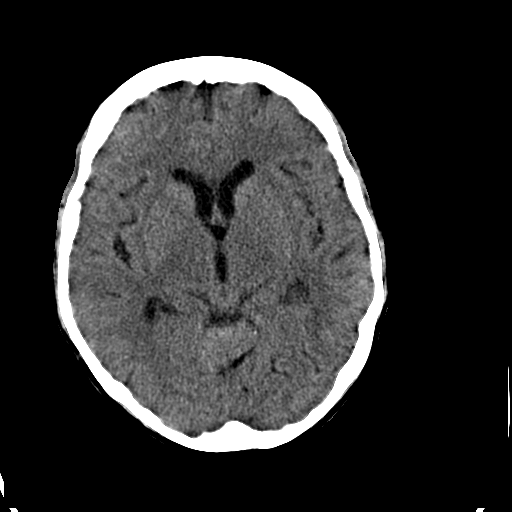
[im 12/36  bone]
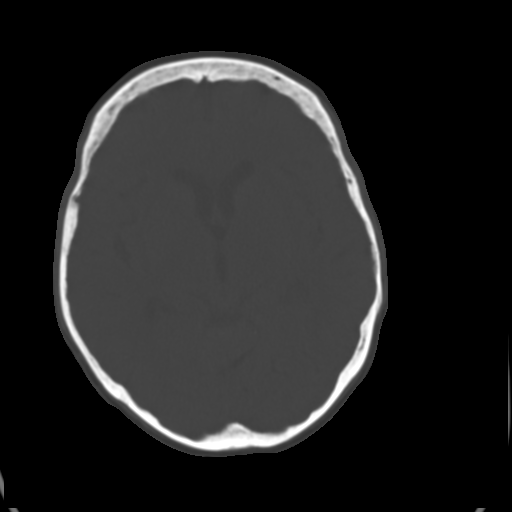
[im 15/36  brain]
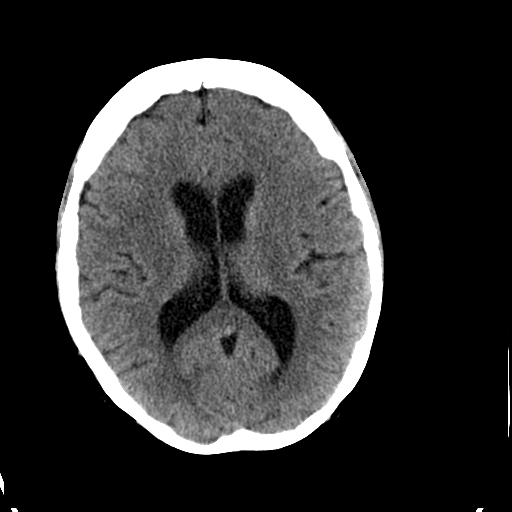
[im 17/36  brain]
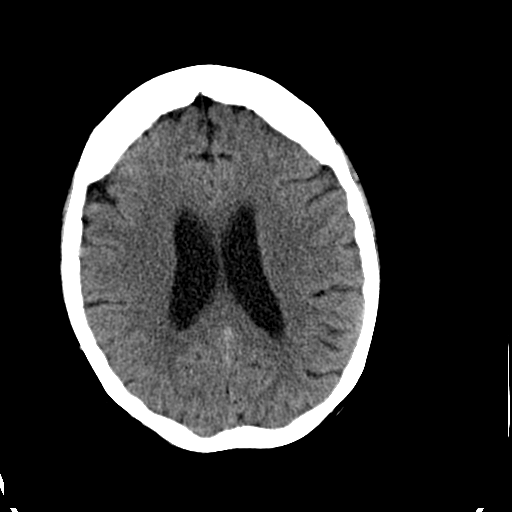
[im 19/36  brain]
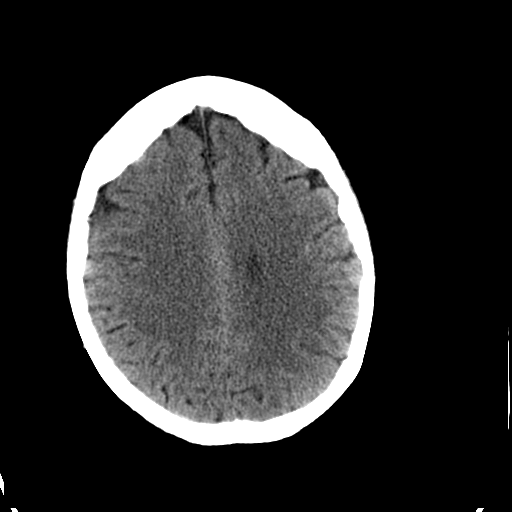
[im 22/36  brain]
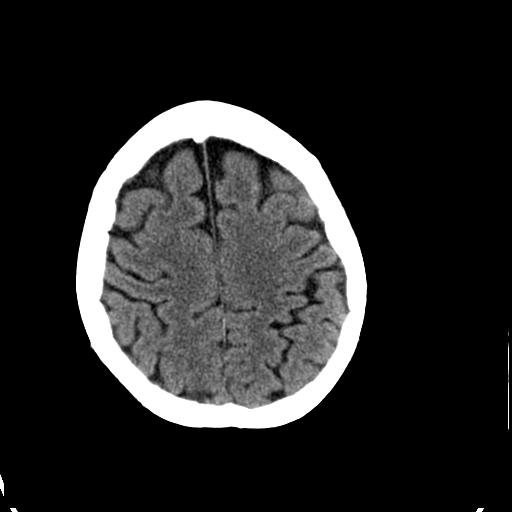
[im 22/36  bone]
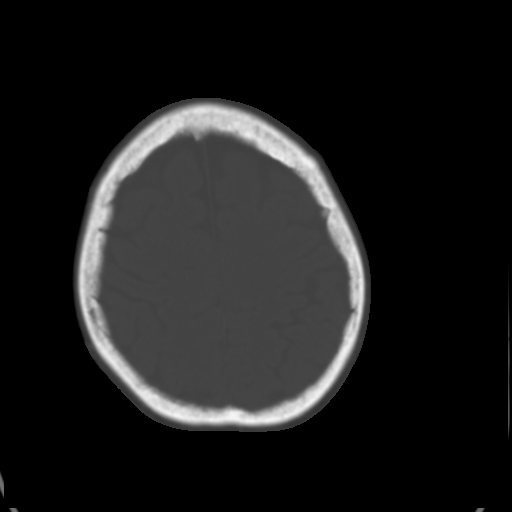
[im 24/36  brain]
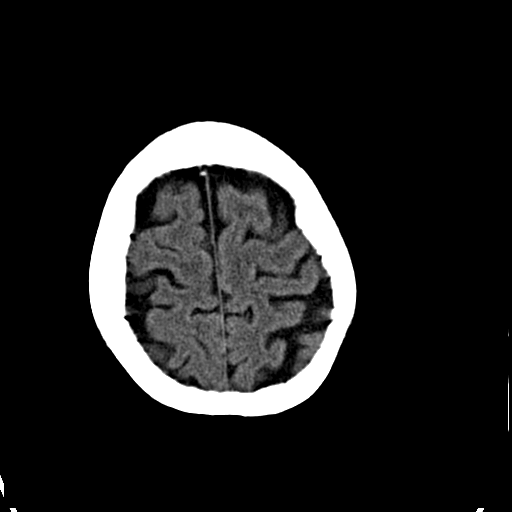
[im 26/36  brain]
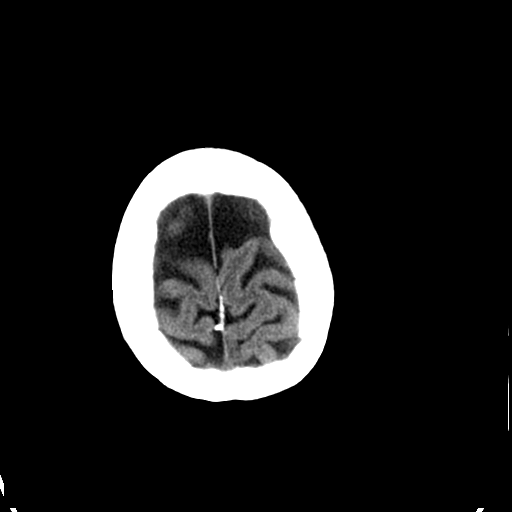
[im 29/36  brain]
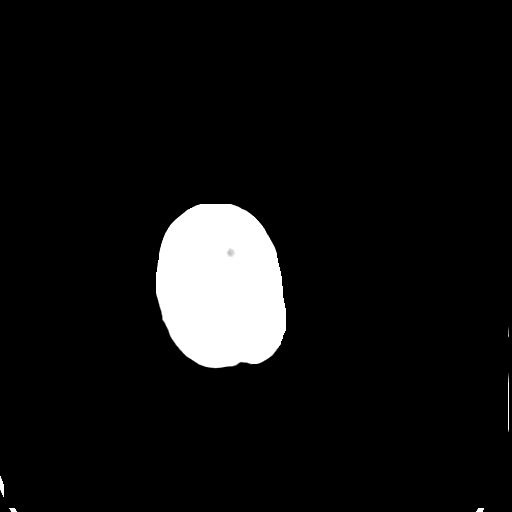
[im 31/36  brain]
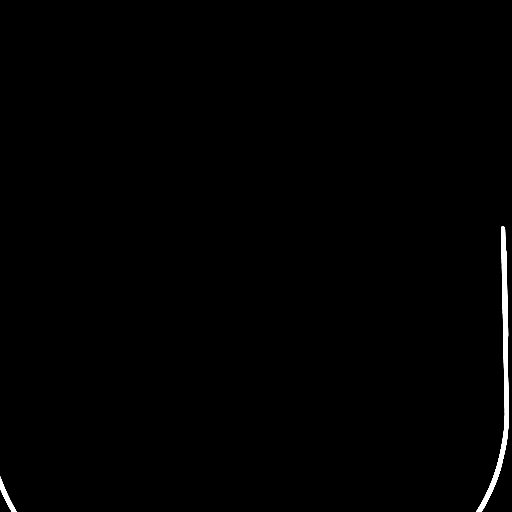
[im 31/36  bone]
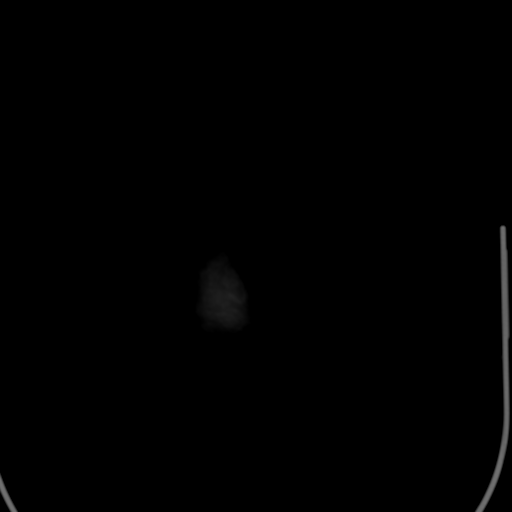
[im 33/36  brain]
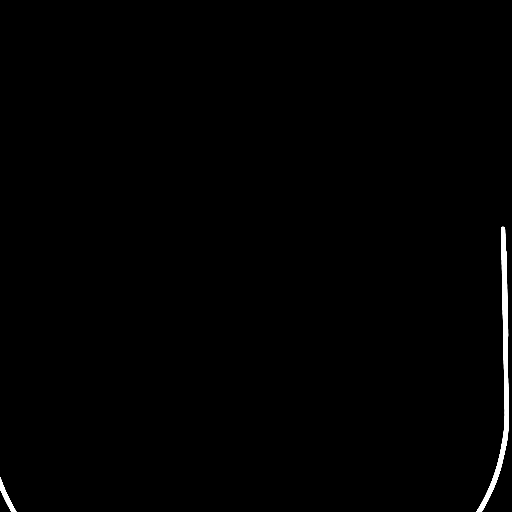

[14 of 30 positions shown; findings below may reference images not displayed]

FINDINGS: Sclerotic and heterogeneous metastatic disease to bone at the
skullbase. The bone mineralization is more permeative, but overall
the extent of involvement has not significantly changed since
October 2013. Calvarium has stable, more normal bone
mineralization. Visualized paranasal sinuses and mastoids are clear.

Visualized orbit soft tissues are within normal limits. Visualized
scalp soft tissues are within normal limits.

Calcified atherosclerosis at the skull base. Stable cerebral volume.
No ventriculomegaly. No midline shift, and basilar cisterns row are
stable. No acute intracranial hemorrhage identified. No evidence of
cortically based acute infarction identified. Major intracranial
vascular structures are enhancing.

There is subtle hyperdensity along the periphery of the anterior
right frontal lobe. There is abnormal postcontrast enhancement there
and at the anterior right sylvian fissure (see series 4, image 10).
Furthermore, there is [REDACTED]bnormal enhancement of cerebellar
folia, especially over the superior cerebellum but also tracking
laterally. There is also curvilinear leptomeningeal enhancement
along the more superior or anterior right frontal lobe (series 4,
image 17).

Despite these findings, no definite vasogenic edema by CT. No
significant intracranial mass effect.
IMPRESSION: 1. Positive for new widespread leptomeningeal metastatic disease.
Cerebellum and right sylvian fissure most affected. Suggestion of
pachymeningeal or early Brain parenchymal involvement in the
anterior right frontal lobe.
2. Despite #1, no intracranial mass effect at this time.
3. Osseous metastatic disease at the skullbase appears stable to
mildly progressed since October 2013.

## 2016-05-14 ENCOUNTER — Other Ambulatory Visit: Payer: Self-pay | Admitting: Nurse Practitioner
